# Patient Record
Sex: Male | Born: 1950 | Race: White | Hispanic: No | Marital: Married | Smoking: Former smoker
Health system: Southern US, Community
[De-identification: ages and names within clinical notes are randomized; demographics above are authoritative.]

## PROBLEM LIST (undated history)

## (undated) ENCOUNTER — Emergency Department (HOSPITAL_COMMUNITY): Admission: EM | Discharge status: Absent without leave | Payer: 59

## (undated) ENCOUNTER — Emergency Department (HOSPITAL_COMMUNITY): Admission: EM | Payer: 59

## (undated) DIAGNOSIS — Z931 Gastrostomy status: Secondary | ICD-10-CM

## (undated) DIAGNOSIS — I5022 Chronic systolic (congestive) heart failure: Secondary | ICD-10-CM

## (undated) DIAGNOSIS — R6521 Severe sepsis with septic shock: Secondary | ICD-10-CM

## (undated) DIAGNOSIS — Z9911 Dependence on respirator [ventilator] status: Secondary | ICD-10-CM

## (undated) DIAGNOSIS — Z930 Tracheostomy status: Secondary | ICD-10-CM

## (undated) DIAGNOSIS — M4800 Spinal stenosis, site unspecified: Secondary | ICD-10-CM

## (undated) DIAGNOSIS — M419 Scoliosis, unspecified: Secondary | ICD-10-CM

## (undated) DIAGNOSIS — U071 COVID-19: Secondary | ICD-10-CM

## (undated) DIAGNOSIS — M5136 Other intervertebral disc degeneration, lumbar region: Secondary | ICD-10-CM

## (undated) DIAGNOSIS — M810 Age-related osteoporosis without current pathological fracture: Secondary | ICD-10-CM

## (undated) DIAGNOSIS — M797 Fibromyalgia: Secondary | ICD-10-CM

## (undated) DIAGNOSIS — I482 Chronic atrial fibrillation, unspecified: Secondary | ICD-10-CM

## (undated) DIAGNOSIS — M51369 Other intervertebral disc degeneration, lumbar region without mention of lumbar back pain or lower extremity pain: Secondary | ICD-10-CM

## (undated) DIAGNOSIS — J9621 Acute and chronic respiratory failure with hypoxia: Secondary | ICD-10-CM

## (undated) DIAGNOSIS — J939 Pneumothorax, unspecified: Secondary | ICD-10-CM

## (undated) DIAGNOSIS — A413 Sepsis due to Hemophilus influenzae: Secondary | ICD-10-CM

## (undated) DIAGNOSIS — J1282 Pneumonia due to coronavirus disease 2019: Secondary | ICD-10-CM

## (undated) DIAGNOSIS — J9601 Acute respiratory failure with hypoxia: Secondary | ICD-10-CM

## (undated) HISTORY — DX: Scoliosis, unspecified: M41.9

## (undated) HISTORY — DX: Fibromyalgia: M79.7

## (undated) HISTORY — PX: TONSILECTOMY, ADENOIDECTOMY, BILATERAL MYRINGOTOMY AND TUBES: SHX2538

## (undated) HISTORY — PX: JOINT REPLACEMENT: SHX530

## (undated) HISTORY — PX: CHOLECYSTECTOMY: SHX55

## (undated) HISTORY — PX: REPLACEMENT TOTAL KNEE BILATERAL: SUR1225

## (undated) HISTORY — DX: Age-related osteoporosis without current pathological fracture: M81.0

## (undated) HISTORY — DX: Other intervertebral disc degeneration, lumbar region: M51.36

## (undated) HISTORY — DX: Other intervertebral disc degeneration, lumbar region without mention of lumbar back pain or lower extremity pain: M51.369

## (undated) HISTORY — DX: Spinal stenosis, site unspecified: M48.00

---
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
## 2008-10-11 ENCOUNTER — Encounter: Admission: RE | Admit: 2008-10-11 | Discharge: 2008-10-11 | Payer: Self-pay | Admitting: Rheumatology

## 2009-02-11 ENCOUNTER — Ambulatory Visit (HOSPITAL_COMMUNITY): Admission: RE | Admit: 2009-02-11 | Discharge: 2009-02-11 | Payer: Self-pay | Admitting: Orthopedic Surgery

## 2009-02-19 ENCOUNTER — Encounter: Admission: RE | Admit: 2009-02-19 | Discharge: 2009-02-19 | Payer: Self-pay | Admitting: Orthopedic Surgery

## 2009-03-05 ENCOUNTER — Inpatient Hospital Stay (HOSPITAL_COMMUNITY): Admission: RE | Admit: 2009-03-05 | Discharge: 2009-03-08 | Payer: Self-pay | Admitting: Gastroenterology

## 2009-03-07 ENCOUNTER — Encounter (INDEPENDENT_AMBULATORY_CARE_PROVIDER_SITE_OTHER): Payer: Self-pay | Admitting: Surgery

## 2009-04-26 ENCOUNTER — Inpatient Hospital Stay (HOSPITAL_COMMUNITY): Admission: RE | Admit: 2009-04-26 | Discharge: 2009-04-28 | Payer: Self-pay | Admitting: Orthopedic Surgery

## 2009-10-29 ENCOUNTER — Encounter: Admission: RE | Admit: 2009-10-29 | Discharge: 2009-10-29 | Payer: Self-pay | Admitting: Neurosurgery

## 2010-07-04 ENCOUNTER — Encounter: Admission: RE | Admit: 2010-07-04 | Discharge: 2010-07-04 | Payer: Self-pay | Admitting: Neurosurgery

## 2010-08-15 ENCOUNTER — Inpatient Hospital Stay (HOSPITAL_COMMUNITY): Admission: RE | Admit: 2010-08-15 | Discharge: 2010-08-17 | Payer: Self-pay | Admitting: Orthopedic Surgery

## 2010-08-19 ENCOUNTER — Ambulatory Visit (HOSPITAL_COMMUNITY): Admission: RE | Admit: 2010-08-19 | Discharge: 2010-08-19 | Payer: Self-pay | Admitting: Orthopedic Surgery

## 2010-09-22 ENCOUNTER — Encounter: Payer: Self-pay | Admitting: Orthopedic Surgery

## 2010-09-23 ENCOUNTER — Encounter: Payer: Self-pay | Admitting: Orthopedic Surgery

## 2010-10-23 ENCOUNTER — Encounter: Payer: Self-pay | Admitting: Orthopedic Surgery

## 2010-11-23 ENCOUNTER — Encounter: Payer: Self-pay | Admitting: Orthopedic Surgery

## 2010-12-08 ENCOUNTER — Encounter
Admission: RE | Admit: 2010-12-08 | Discharge: 2010-12-08 | Payer: Self-pay | Source: Home / Self Care | Attending: Neurosurgery | Admitting: Neurosurgery

## 2011-01-01 ENCOUNTER — Other Ambulatory Visit (HOSPITAL_COMMUNITY): Payer: Self-pay | Admitting: Orthopedic Surgery

## 2011-01-01 DIAGNOSIS — T84038A Mechanical loosening of other internal prosthetic joint, initial encounter: Secondary | ICD-10-CM

## 2011-01-12 ENCOUNTER — Ambulatory Visit (HOSPITAL_COMMUNITY)
Admission: RE | Admit: 2011-01-12 | Discharge: 2011-01-12 | Disposition: A | Discharge status: Home / Self Care | Payer: PRIVATE HEALTH INSURANCE | Source: Ambulatory Visit | Attending: Orthopedic Surgery | Admitting: Orthopedic Surgery

## 2011-01-12 ENCOUNTER — Encounter (HOSPITAL_COMMUNITY)
Admission: RE | Admit: 2011-01-12 | Discharge: 2011-01-12 | Disposition: A | Discharge status: Home / Self Care | Payer: PRIVATE HEALTH INSURANCE | Source: Ambulatory Visit | Attending: Orthopedic Surgery | Admitting: Orthopedic Surgery

## 2011-01-12 DIAGNOSIS — M25569 Pain in unspecified knee: Secondary | ICD-10-CM | POA: Insufficient documentation

## 2011-01-12 DIAGNOSIS — Z96659 Presence of unspecified artificial knee joint: Secondary | ICD-10-CM | POA: Insufficient documentation

## 2011-01-12 DIAGNOSIS — T84038A Mechanical loosening of other internal prosthetic joint, initial encounter: Secondary | ICD-10-CM

## 2011-01-12 MED ORDER — TECHNETIUM TC 99M MEDRONATE IV KIT
23.5000 | PACK | Freq: Once | INTRAVENOUS | Status: AC | PRN
  Administered 2011-01-12: 24 via INTRAVENOUS

## 2011-02-05 LAB — BASIC METABOLIC PANEL
BUN: 11 mg/dL (ref 6–23)
BUN: 12 mg/dL (ref 6–23)
CO2: 33 mEq/L — ABNORMAL HIGH (ref 19–32)
Chloride: 103 mEq/L (ref 96–112)
GFR calc non Af Amer: >60 mL/min (ref >60)
Glucose, Bld: 129 mg/dL — ABNORMAL HIGH (ref 70–99)
Glucose, Bld: 131 mg/dL — ABNORMAL HIGH (ref 70–99)
Potassium: 4.1 mEq/L (ref 3.5–5.1)
Potassium: 4.3 mEq/L (ref 3.5–5.1)

## 2011-02-05 LAB — URINALYSIS, ROUTINE W REFLEX MICROSCOPIC
Nitrite: NEGATIVE
Protein, ur: NEGATIVE mg/dL
Specific Gravity, Urine: 1.01 (ref 1.005–1.030)

## 2011-02-05 LAB — COMPREHENSIVE METABOLIC PANEL
AST: 21 U/L (ref 0–37)
Alkaline Phosphatase: 51 U/L (ref 39–117)
BUN: 12 mg/dL (ref 6–23)
CO2: 32 mEq/L (ref 19–32)
Calcium: 9.7 mg/dL (ref 8.4–10.5)
Creatinine, Ser: 0.7 mg/dL (ref 0.4–1.5)
Glucose, Bld: 103 mg/dL — ABNORMAL HIGH (ref 70–99)
Potassium: 4.6 mEq/L (ref 3.5–5.1)
Sodium: 140 mEq/L (ref 135–145)
Total Bilirubin: 0.2 mg/dL — ABNORMAL LOW (ref 0.3–1.2)

## 2011-02-05 LAB — CBC
HCT: 35.5 % — ABNORMAL LOW (ref 39.0–52.0)
HCT: 35.5 % — ABNORMAL LOW (ref 39.0–52.0)
Hemoglobin: 16.6 g/dL (ref 13.0–17.0)
MCH: 32 pg (ref 26.0–34.0)
MCH: 32.8 pg (ref 26.0–34.0)
MCHC: 33.2 g/dL (ref 30.0–36.0)
MCHC: 34.9 g/dL (ref 30.0–36.0)
MCV: 93.9 fL (ref 78.0–100.0)
MCV: 94.7 fL (ref 78.0–100.0)
MCV: 94.9 fL (ref 78.0–100.0)
Platelets: 315 10*3/uL (ref 150–400)
RBC: 5.06 MIL/uL (ref 4.22–5.81)
RDW: 13.3 % (ref 11.5–15.5)
RDW: 13.5 % (ref 11.5–15.5)
RDW: 13.5 % (ref 11.5–15.5)
WBC: 10 10*3/uL (ref 4.0–10.5)
WBC: 12.5 10*3/uL — ABNORMAL HIGH (ref 4.0–10.5)

## 2011-02-05 LAB — TYPE AND SCREEN: ABO/RH(D): A POS

## 2011-02-05 LAB — URINE CULTURE
Colony Count: NO GROWTH
Culture  Setup Time: 201109191137
Culture: NO GROWTH

## 2011-02-05 LAB — DIFFERENTIAL
Eosinophils Absolute: 0.2 10*3/uL (ref 0.0–0.7)
Eosinophils Relative: 2 % (ref 0–5)
Monocytes Relative: 10 % (ref 3–12)
Neutro Abs: 5.8 10*3/uL (ref 1.7–7.7)
Neutrophils Relative %: 57 % (ref 43–77)

## 2011-03-02 LAB — CBC
HCT: 35 % — ABNORMAL LOW (ref 39.0–52.0)
Hemoglobin: 12.1 g/dL — ABNORMAL LOW (ref 13.0–17.0)
Hemoglobin: 13.7 g/dL (ref 13.0–17.0)
Hemoglobin: 17.1 g/dL — ABNORMAL HIGH (ref 13.0–17.0)
MCHC: 34.7 g/dL (ref 30.0–36.0)
MCV: 93.7 fL (ref 78.0–100.0)
RBC: 3.73 MIL/uL — ABNORMAL LOW (ref 4.22–5.81)
RBC: 4.21 MIL/uL — ABNORMAL LOW (ref 4.22–5.81)
RBC: 5.3 MIL/uL (ref 4.22–5.81)
WBC: 11.4 10*3/uL — ABNORMAL HIGH (ref 4.0–10.5)
WBC: 13.1 10*3/uL — ABNORMAL HIGH (ref 4.0–10.5)

## 2011-03-02 LAB — URINALYSIS, ROUTINE W REFLEX MICROSCOPIC
Bilirubin Urine: NEGATIVE
Nitrite: NEGATIVE
Specific Gravity, Urine: 1.012 (ref 1.005–1.030)
Urobilinogen, UA: 1 mg/dL (ref 0.0–1.0)

## 2011-03-02 LAB — URINE CULTURE

## 2011-03-02 LAB — BASIC METABOLIC PANEL
BUN: 6 mg/dL (ref 6–23)
CO2: 30 mEq/L (ref 19–32)
Calcium: 8.1 mg/dL — ABNORMAL LOW (ref 8.4–10.5)
Calcium: 9.3 mg/dL (ref 8.4–10.5)
Chloride: 108 mEq/L (ref 96–112)
GFR calc Af Amer: >60 mL/min (ref >60)
GFR calc Af Amer: >60 mL/min (ref >60)
GFR calc Af Amer: >60 mL/min (ref >60)
GFR calc non Af Amer: >60 mL/min (ref >60)
Potassium: 3.6 mEq/L (ref 3.5–5.1)
Sodium: 138 mEq/L (ref 135–145)
Sodium: 140 mEq/L (ref 135–145)
Sodium: 142 mEq/L (ref 135–145)

## 2011-03-02 LAB — HEPATIC FUNCTION PANEL
Albumin: 3.5 g/dL (ref 3.5–5.2)
Bilirubin, Direct: 0.1 mg/dL (ref 0.0–0.3)
Total Bilirubin: 0.7 mg/dL (ref 0.3–1.2)

## 2011-03-02 LAB — APTT: aPTT: 27 seconds (ref 24–37)

## 2011-03-02 LAB — PROTIME-INR
INR: 1 (ref 0.00–1.49)
INR: 1 (ref 0.00–1.49)

## 2011-03-02 LAB — TYPE AND SCREEN: ABO/RH(D): A POS

## 2011-03-04 LAB — COMPREHENSIVE METABOLIC PANEL
ALT: 48 U/L (ref 0–53)
ALT: 43 U/L (ref 0–53)
AST: 28 U/L (ref 0–37)
Albumin: 3 g/dL — ABNORMAL LOW (ref 3.5–5.2)
Albumin: 3.3 g/dL — ABNORMAL LOW (ref 3.5–5.2)
Albumin: 3.6 g/dL (ref 3.5–5.2)
Alkaline Phosphatase: 59 U/L (ref 39–117)
BUN: 12 mg/dL (ref 6–23)
BUN: 15 mg/dL (ref 6–23)
CO2: 30 mEq/L (ref 19–32)
CO2: 28 mEq/L (ref 19–32)
Calcium: 8.2 mg/dL — ABNORMAL LOW (ref 8.4–10.5)
Calcium: 8.6 mg/dL (ref 8.4–10.5)
Calcium: 8.8 mg/dL (ref 8.4–10.5)
Chloride: 100 mEq/L (ref 96–112)
Creatinine, Ser: 0.68 mg/dL (ref 0.4–1.5)
Creatinine, Ser: 0.64 mg/dL (ref 0.4–1.5)
GFR calc Af Amer: >60 mL/min (ref >60)
GFR calc non Af Amer: >60 mL/min (ref >60)
GFR calc non Af Amer: >60 mL/min (ref >60)
Glucose, Bld: 89 mg/dL (ref 70–99)
Glucose, Bld: 115 mg/dL — ABNORMAL HIGH (ref 70–99)
Glucose, Bld: 159 mg/dL — ABNORMAL HIGH (ref 70–99)
Potassium: 3.9 mEq/L (ref 3.5–5.1)
Sodium: 142 mEq/L (ref 135–145)
Total Bilirubin: 1.1 mg/dL (ref 0.3–1.2)
Total Protein: 5.9 g/dL — ABNORMAL LOW (ref 6.0–8.3)
Total Protein: 6.3 g/dL (ref 6.0–8.3)

## 2011-03-04 LAB — CBC
HCT: 44.7 % (ref 39.0–52.0)
HCT: 46.7 % (ref 39.0–52.0)
HCT: 47.4 % (ref 39.0–52.0)
Hemoglobin: 15.3 g/dL (ref 13.0–17.0)
Hemoglobin: 16.3 g/dL (ref 13.0–17.0)
Hemoglobin: 16.3 g/dL (ref 13.0–17.0)
MCHC: 34.2 g/dL (ref 30.0–36.0)
MCHC: 34.4 g/dL (ref 30.0–36.0)
MCHC: 34.9 g/dL (ref 30.0–36.0)
MCV: 96.2 fL (ref 78.0–100.0)
MCV: 94.6 fL (ref 78.0–100.0)
Platelets: 215 10*3/uL (ref 150–400)
Platelets: 255 10*3/uL (ref 150–400)
RBC: 4.65 MIL/uL (ref 4.22–5.81)
RBC: 4.6 MIL/uL (ref 4.22–5.81)
RBC: 4.93 MIL/uL (ref 4.22–5.81)
RDW: 13.5 % (ref 11.5–15.5)
WBC: 17.7 10*3/uL — ABNORMAL HIGH (ref 4.0–10.5)
WBC: 16.2 10*3/uL — ABNORMAL HIGH (ref 4.0–10.5)
WBC: 17 10*3/uL — ABNORMAL HIGH (ref 4.0–10.5)

## 2011-03-04 LAB — PROTIME-INR
INR: 1.1 (ref 0.00–1.49)
Prothrombin Time: 14.4 seconds (ref 11.6–15.2)

## 2011-03-04 LAB — TYPE AND SCREEN: ABO/RH(D): A POS

## 2011-03-04 LAB — LIPASE, BLOOD
Lipase: 1124 U/L — ABNORMAL HIGH (ref 11–59)
Lipase: 916 U/L — ABNORMAL HIGH (ref 11–59)

## 2011-03-04 LAB — ABO/RH: ABO/RH(D): A POS

## 2011-03-05 LAB — PROTIME-INR
INR: 0.9 (ref 0.00–1.49)
Prothrombin Time: 12.3 seconds (ref 11.6–15.2)

## 2011-03-05 LAB — URINALYSIS, ROUTINE W REFLEX MICROSCOPIC
Glucose, UA: NEGATIVE mg/dL
Hgb urine dipstick: NEGATIVE
Ketones, ur: NEGATIVE mg/dL
Protein, ur: NEGATIVE mg/dL
pH: 6.5 (ref 5.0–8.0)

## 2011-03-05 LAB — URINE CULTURE: Colony Count: 9000

## 2011-03-05 LAB — COMPREHENSIVE METABOLIC PANEL
AST: 67 U/L — ABNORMAL HIGH (ref 0–37)
BUN: 13 mg/dL (ref 6–23)
CO2: 27 mEq/L (ref 19–32)
Calcium: 9 mg/dL (ref 8.4–10.5)
Chloride: 103 mEq/L (ref 96–112)
Creatinine, Ser: 0.71 mg/dL (ref 0.4–1.5)
GFR calc non Af Amer: >60 mL/min (ref >60)
Glucose, Bld: 97 mg/dL (ref 70–99)
Total Bilirubin: 2.8 mg/dL — ABNORMAL HIGH (ref 0.3–1.2)

## 2011-03-05 LAB — RENAL FUNCTION PANEL
BUN: 13 mg/dL (ref 6–23)
CO2: 27 mEq/L (ref 19–32)
Chloride: 104 mEq/L (ref 96–112)
Glucose, Bld: 94 mg/dL (ref 70–99)
Phosphorus: 4.1 mg/dL (ref 2.3–4.6)
Potassium: 3.7 mEq/L (ref 3.5–5.1)
Sodium: 138 mEq/L (ref 135–145)

## 2011-03-05 LAB — CBC
HCT: 43.8 % (ref 39.0–52.0)
Hemoglobin: 15.1 g/dL (ref 13.0–17.0)
MCHC: 34.5 g/dL (ref 30.0–36.0)
MCV: 95.8 fL (ref 78.0–100.0)
RBC: 4.57 MIL/uL (ref 4.22–5.81)
WBC: 10.4 10*3/uL (ref 4.0–10.5)

## 2011-03-05 LAB — CYCLIC CITRUL PEPTIDE ANTIBODY, IGG: Cyclic Citrullin Peptide Ab: 0.6 U/mL (ref <7)

## 2011-04-07 NOTE — Op Note (Signed)
NAME:  Sergio Mitchell, Sergio Mitchell NO.:  [id number]   MEDICAL RECORD NO.:  [id number]          PATIENT TYPE:  OIB   LOCATION:  5121                         FACILITY:  [facility]   PHYSICIAN:  Velora Heckler, MD      DATE OF BIRTH:  May 13, 1951   DATE OF PROCEDURE:  03/07/2009  DATE OF DISCHARGE:                               OPERATIVE REPORT   PREOPERATIVE DIAGNOSES:  Chronic cholecystitis, cholelithiasis, and  history of choledocholithiasis.   POSTOPERATIVE DIAGNOSES:  Chronic cholecystitis, cholelithiasis, and  history of choledocholithiasis.   PROCEDURE:  Laparoscopic cholecystectomy with intraoperative  cholangiography.   SURGEON:  Velora Heckler, MD, FACS   ASSISTANT:  Kelle Darting. Rennis Harding, NP   ANESTHESIA:  General.   ESTIMATED BLOOD LOSS:  Minimal.   PREPARATION:  ChloraPrep.   COMPLICATIONS:  None.   INDICATIONS:  The patient is a 60 year old white male, admitted on the  Gastroenterology Service with cholelithiasis, common bile duct stones,  and abnormal liver function test.  The patient was taken to the  endoscopy suite on March 05, 2009, where he underwent ERCP with  sphincterotomy and stone extraction.  Postoperative course was  complicated by mild post ERCP pancreatitis.  This improved significantly  over 48 hours, and the patient has now prepared and brought to the  operating room for cholecystectomy.   BODY OF REPORT:  Procedure was done in OR #70 at the [facility].  The patient was brought to the operating room and  placed in supine position on the operating room table.  Following  administration of general anesthesia, the patient was positioned and  then prepped and draped in the usual strict aseptic fashion.  After  ascertaining that an adequate level of anesthesia had been achieved, an  infraumbilical incision was made a midline with a #15 blade.  Dissection  was carried down to the fascia.  Fascia was incised in the midline and  the peritoneal cavity was entered cautiously.  A 0 Vicryl purse-string  suture was placed in the fascia.  A Hasson cannula was introduced under  direct vision into the peritoneal cavity and secured with a purse-string  suture.  Abdomen was insufflated with carbon dioxide.  Laparoscope was  introduced and the abdomen explored.  There was an inflammatory process  in the right upper quadrant with adhesions.  Omentum obscures the  gallbladder.  Operative ports were placed along the right costal margin  in the midline, midclavicular line, and anterior axillary line.  The  liver edge was grasped and elevated.  Adhesions were gently taken down  with blunt dissection and hemostasis obtained with electrocautery.  Gallbladder was identified.  It was small and contracted.  It was  grasped and retracted cephalad and laterally.  With some difficulty,  adhesions are mobilized off the surface of the gallbladder and the neck  of the gallbladder was identified.  Cystic duct was dissected out.  Cystic artery was identified, doubly clipped, and divided.  Cystic duct  was encircled.  A clip was placed at the neck of the gallbladder.  Cystic  duct was incised.  Dark bile emanates from the cystic duct.  A  Cook cholangiography catheter was inserted in a stab wound in the right  upper quadrant.  It was placed into the cystic duct and secured with 2  Ligaclips.  Using C-arm fluoroscopy, real time cholangiography was  performed.  There was rapid filling of a mildly dilated common bile  duct.  There was reflux of contrast into the right and left hepatic  ductal systems.  There was free flow of contrast distally into the  duodenum without filling defect or obstruction.  Clips were withdrawn.  Cook catheter was removed from the peritoneal cavity.  Due to the  thickness of the cystic duct, an Endoloop was used for closure of the  cystic duct in order to assure complete closure.  Additionally, 2 clips  were placed on  the cystic duct.  Cystic duct was divided.  Gallbladder  was excised from the gallbladder bed.  Posterior branch of the cystic  artery was divided between Ligaclips with the electrocautery.  Hook  electrocautery was used to resect the remaining gallbladder from the  gallbladder bed where it was relatively intrahepatic.  Gallbladder was  completely excised and placed into an EndoCatch bag.  It was removed  through the umbilical port without difficulty.  A 0 Vicryl purse-string  suture was tied securely at the umbilical wound.  Right upper quadrant  was irrigated with warm saline.  Good hemostasis was noted.  Fluid was  evacuated.  Ports were removed under direct vision and pneumoperitoneum  was released.  Good hemostasis was noted at all port sites.  Port sites  were anesthetized with local anesthetic.  Skin incisions were closed  with interrupted 4-0 Vicryl subcuticular sutures.  Wounds were washed  and dried, and benzoin and Steri-Strips were applied.  Sterile dressings  were applied.  The patient was awakened from anesthesia and brought to  the recovery room in stable condition.  The patient tolerated the  procedure well.      Velora Heckler, MD  Electronically Signed     TMG/MEDQ  D:  03/07/2009  T:  03/08/2009  Job:  [id number]   cc:   Danise Edge, M.D.  Dyke Brackett, M.D.

## 2011-04-07 NOTE — H&P (Signed)
NAME:  Sergio Mitchell, Sergio Mitchell NO.:  [id number]   MEDICAL RECORD NO.:  [id number]          PATIENT TYPE:  OIB   LOCATION:  5121                         FACILITY:  [facility]   PHYSICIAN:  Petra Kuba, M.D.    DATE OF BIRTH:  [birth year]   DATE OF ADMISSION:  03/05/2009  DATE OF DISCHARGE:                              HISTORY & PHYSICAL   HISTORY:  The patient is admitted after ERCP sphincterotomy and stone  extraction for observation and then hopefully if no delayed  complications, we will have lap chole tomorrow.  The patient has had  some belly pain, nausea, and vomiting, presented for his knee surgery  and although his GI symptoms had improved, his liver tests were up.  Ultrasound showed gallstones and CBD stones and he was referred for  cholecystectomy and ERCP prior to his knee surgery.  For the last week,  he has not had any GI problems except for reflux.   PAST MEDICAL HISTORY:  Pertinent for significant knee problems as well  as reflux and history of ulcers and some sinus issues as well as a  history of eventual hernia and tonsillectomy.   FAMILY HISTORY:  Pertinent for his sister with gallbladder problems, but  no other GI issues.   SOCIAL HISTORY:  Does smoke, but does not drink.   ALLERGIES:  NEOSPORIN and PENICILLIN.   REVIEW OF SYSTEMS:  Negative except above.   CURRENT MEDICINES AT HOME:  Toradol, Lexapro, Neurontin, Claritin,  Lortab, and omeprazole.   PHYSICAL EXAMINATION:  VITAL SIGNS:  Stable, afebrile.  GENERAL:  No acute distress.  HEENT:  Sclerae nonicteric.  NECK:  Supple without adenopathy.  LUNGS:  Clear.  HEART:  Regular rate and rhythm.  ABDOMEN:  Soft and nontender.   LABORATORY DATA:  Initially pertinent for elevated liver tests, but then  they resolved in Dr. Henriette Combs office.  Ultrasound did show CBD stone as  well as gallstones.  An ERCP did confirm the CBD stones, status post  sphincterotomy and removal.   PLAN:  We will observe  for delayed complications.  If not, lap chole  with probable intraoperative cholangiogram tomorrow.  I have discussed  the above with the Surgical Team and then when okay from a postop  standpoint, can return to Dr. Madelon Lips for his knee surgery.           ______________________________  Petra Kuba, M.D.     MEM/MEDQ  D:  03/05/2009  T:  03/06/2009  Job:  [id number]   cc:   Danise Edge, M.D.  Velora Heckler, MD  Dyke Brackett, M.D.

## 2011-04-07 NOTE — Op Note (Signed)
NAME:  Sergio Mitchell, Sergio Mitchell NO.:  [id number]   MEDICAL RECORD NO.:  [id number]          PATIENT TYPE:  OIB   LOCATION:  5121                         FACILITY:  [facility]   PHYSICIAN:  Petra Kuba, M.D.    DATE OF BIRTH:  [birth year]   DATE OF PROCEDURE:  03/05/2009  DATE OF DISCHARGE:                               OPERATIVE REPORT   PROCEDURES:  Endoscopic retrograde cholangiopancreatography,  sphincterotomy, stone extraction.   INDICATION:  The patient with CBD stone on ultrasound and elevated liver  test, which resolved when his symptoms resolved.  Consent was signed  after risks, benefits, methods, and options thoroughly discussed by both  Dr. Laural Benes in the office last week and me prior to sedation with both  he and his wife.   MEDICINES USED:  Fentanyl 125 mcg, Versed 9 mg, Benadryl 25 mg, glucagon  2 mg throughout the procedure.   PROCEDURE:  The side-viewing therapeutic video duodenoscope was inserted  by indirect vision into the stomach and advanced into the duodenum to a  widely patent pylorus.  The periampullary diverticula and normal-  appearing ampulla was seen.  On the first attempt at cannulation, the  wire seemed to go towards the pancreas.  This did occur 2 or 3 times.  We then went ahead and turned him on his side to get a better angle on  the ampulla and seemed to have deep selective cannulation of the CBD.  Unfortunately with him being on his side and his arm being in the way,  we could not get great pictures of his CBD.  Dr. Abelino Derrick of  Radiology was consulted and felt we were probably in the CBD as well.  We did go ahead and aspirate some bile through the catheter, which  helped confirm we were in the right position, although the pictures were  not great.  We did go ahead and make a very small sphincterotomy in this  position with our difficulty with fluoro.  We elected to roll him back  on his belly.  In doing so, we lost our position and on  repeat  cannulation, the wire again went towards the pancreas.  We at this  junction tried to put a PD stent in, 5-cm 5-French unfortunately once it  was placed properly while we were withdrawing the wire and we had pushed  the stent with the sphincterotome.  The stent got hung up within the  sphincterotome and could not be released, so the wire in the stent and  sphincterotome were removed.  We then tried the smaller sphincterotome  loaded with smaller wire and we were able to get deep selective  cannulation into the CBD with much better filling and much better  pictures and an obvious CBD stone was seen.  We went ahead and increased  the sphincterotomy site moderately till we had excellent biliary  drainage since the stone seemed to be moderate size.  We could get the  fully bowed sphincterotome in and out of the duct.  We then went ahead  and exchanged for the balloon and  on the first balloon pull-through, the  stone was delivered.  We then went ahead and proceeded with a few more  balloon pull-throughs and then an occlusion cholangiogram, not mentioned  above the cystic duct was patent and the gallbladder had some contrast  in it and negative occlusion cholangiogram was seen and there was  adequate biliary drainage after we pulled the balloon through.  We were  able to use the 12-15 mm adjustable balloon and pulled the 15-mm balloon  through.  We elected to stop the procedure at this juncture.  We did not  try to recannulate the pancreas and place a further PD stent.  The scope  was removed.  Not mentioned above, the patient did have some respiratory  issues in the middle of the procedure, required rolling him on his left  side to complete the procedure, which is how we did the occlusion  cholangiogram and pull the balloon through at the end.   ENDOSCOPIC DIAGNOSES:  1. Periampullary diverticula.  2. Few wire advancements and minimal dye into the pancreas.  3. Change to his left  side secondary to breathing x2.  4. Questionable PD wire left in place, but failed placing a 5-cm 5-      French stent into the pancreas due to getting hung up in the      sphincterotome.  5. CBD dilated patent cystic duct moderate-sized stone, status post      removal after sphincterotomy x2 and balloon pull-through with the      adjustable balloon 12-15 mm.  6. Negative balloon pull-throughs and negative occlusion cholangiogram      with good drainage at the end of the procedure.   PLAN:  We will observe overnight for observation.  Make sure no delayed  complications and if none, probably LAP CHOLE tomorrow.  I have  discussed the case with the surgeons.           ______________________________  Petra Kuba, M.D.     MEM/MEDQ  D:  03/05/2009  T:  03/06/2009  Job:  [id number]   cc:   Velora Heckler, MD  Dyke Brackett, M.D.  Danise Edge, M.D.

## 2011-04-07 NOTE — Consult Note (Signed)
NAME:  ARRICK, DUTTON NO.:  [id number]   MEDICAL RECORD NO.:  [id number]          PATIENT TYPE:  OIB   LOCATION:  5121                         FACILITY:  [facility]   PHYSICIAN:  Velora Heckler, MD      DATE OF BIRTH:  1951-02-14   DATE OF CONSULTATION:  03/05/2009  DATE OF DISCHARGE:                                 CONSULTATION   REQUESTING PHYSICIAN:  Petra Kuba, MD   CONSULTING SURGEON:  Velora Heckler, MD   REASON FOR CONSULTATION:  Choledocholithiasis, needs laparoscopic  cholecystectomy.   HISTORY OF PRESENT ILLNESS:  Mr. Melendrez is a 60 year old white male with  the history of peptic ulcer disease, BPH, and osteoarthritis, who was  going in for a knee arthroscopy on February 19, 2009, by Dr. Madelon Lips.  He  did have preop labs done, which revealed elevated LFTs with a total  bilirubin of 2.8 and an alkaline phosphatase of 142.  At this time, the  ultrasound was obtained, which showed a dilated common bile duct to 14  mm with an 11-mm common bile duct stone.  At that time, the patient was  sent to [facility] where an outpatient ERCP was set up for  today.  The patient did have an ERCP today and the common bile duct  stone was removed.  At this time, we were asked to see the patient for  possible laparoscopic cholecystectomy tomorrow.  Currently, of note upon  arrival to the room, the patient is currently complaining of severe  diffuse abdominal pain and cramping along with nausea.  He is hunched  over on the toilet in almost a fetal position.   REVIEW OF SYSTEMS:  Please see HPI.  The patient does complain of  several episodes within the past month or so of nausea and some emesis  that would later pass.  Currently, he is complaining of some shortness  of breath, but no chest pain.  He admits to diffuse abdominal pain  currently.  He does admit to BPH with difficulty urinating.  Otherwise,  all other systems are currently negative.   PAST MEDICAL  HISTORY:  1. Peptic ulcer disease.  2. Osteoarthritis.  3. BPH.  4. Anxiety.   PAST SURGICAL HISTORY:  1. Tonsillectomy.  2. Hernia repair.   SOCIAL HISTORY:  The patient is married.  He has 3 children, who are all  grown.  He is a Curator.  He smokes approximately 7-8 cigars a day.  He  quit smoking cigarettes several years back.  He does not drink any  alcohol.   ALLERGIES:  1. NEOSPORIN.  2. PENICILLIN, which causes diarrhea.   MEDICATIONS:  Currently, the patient and his wife were in the room, but  he did not bring the list of his medications in.  Therefore, I do not  know of any medications that he takes at home.   PHYSICAL EXAMINATION:  GENERAL:  This is a 60 year old white male who is  currently in acute distress secondary to abdominal pain.  Otherwise, he  is well developed and well nourished.  VITAL SIGNS:  Temperature is 97.5, pulse 44, respirations 16, and blood  pressure 148/66.  HEENT:  Head is normocephalic and atraumatic.  Sclerae noninjected.  Pupils were equal, round, and reactive to light.  Ears and nose without  any obvious masses or lesions.  No rhinorrhea.  Mouth is pink and moist.  Throat shows no exudate.  NECK:  Supple.  Trachea is midline.  No thyromegaly.  HEART:  Currently, regular rhythm.  However, he is somewhat bradycardic  at 44 beats per minute.  Otherwise, I did not notice any murmurs,  gallops, or rubs.  Carotid, radial, and pedal pulses 2+ bilaterally.  LUNGS:  Clear to auscultation bilaterally with no wheezes, rhonchi, or  rales noted.  Respiratory effort is nonlabored.  ABDOMEN:  Soft, however, moderately distended.  He does not have any  active bowel sounds and is exquisitely tender in all 4 quadrants. It  appears that the patient is developing some peritoneal signs.  However,  he has just received some medicine and so I will come back and reexamine  him within the next 30 minutes or so.  MUSCULOSKELETAL:  All 4 extremities are  symmetrical with no cyanosis,  clubbing, or edema  NEUROLOGIC:  Cranial nerves II through XII appeared to be grossly  intact.  PSYCH:  The patient is alert and oriented with an appropriate affect.   LABORATORY DATA AND DIAGNOSTICS:  Ultrasound done several weeks ago  shows no cholecystitis, but cholelithiasis and choledocholithiasis and  an 11-mm common bile duct stone and a 14-mm dilated common bile duct.  At that time, I believe on February 11, 2009, labs were obtained, which  showed an AST of 67, ALT of 204, alkaline phosphatase of 142, and total  bilirubin of 2.8.  Currently, his CBC as well as his CMET are pending.  A stat two-view abdominal x-ray is also pending.   IMPRESSION:  1. Choledocholithiasis.  2. Cholelithiasis.  3. Status post endoscopic retrograde cholangiopancreatography.  4. Severe abdominal pain, status post endoscopic retrograde      cholangiopancreatography.   PLAN:  At this time, due to the patient's severe pain, we will obtain a  stat two-view abdominal x-ray to check for free air.  Otherwise, we will  check a lipase in the morning to look for post ERCP pancreatitis.  He  will be made n.p.o. after midnight and we will write for a permit for a  laparoscopic cholecystectomy to be done in the morning.  Otherwise, we  will order Cipro on-call to the OR prophylactically.  In the meantime,  if for some reason, his two-view abdominal x-rays come back showing  anything different such as free air, obviously these plans will be  altered.      Letha Cape, PA      Velora Heckler, MD  Electronically Signed    KEO/MEDQ  D:  03/05/2009  T:  03/06/2009  Job:  [id number]

## 2011-04-07 NOTE — Op Note (Signed)
NAME:  Sergio Mitchell, Sergio Mitchell NO.:  [id number]   MEDICAL RECORD NO.:  [id number]          PATIENT TYPE:  INP   LOCATION:  5008                         FACILITY:  [facility]   PHYSICIAN:  Dyke Brackett, M.D.    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   POSTOPERATIVE DIAGNOSIS:  Osteoarthritis right knee with varus  deformity.   OPERATION:  Right total knee replacement.  This is LCS cemented standard  plus femur, size 5 tibia, 10-mm bearing, standard patella 3 peg.   SURGEON:  Dyke Brackett, M.D.   ASSISTANTKatrinka Blazing, PA   TOURNIQUET TIME:  1 hour 20 minutes.   DESCRIPTION OF PROCEDURE:  Sterile prep and drape, exsanguination of  legs, placed in 350.  A straight skin incision with medial parapatellar  approach to the knee made with cutting of the tibia initially with a 7-  degree slope with the appropriate valgus, was followed by an anterior-  posterior femoral cut with a 4-degree valgus cut, release of the medial  side of the knee due to severity of the varus deformity was noted to  balance the flexion-extension gap 10 mm.  Chamfer cut was done with keel  holes for the prosthesis.   Attention was next directed at the tibia.  Prior to this though a small  amount of posterior lip was removed from both condyles and the femur.  Tibia was previous being cut and keel hole was cut for a size 5 tibia  followed by appropriate trialing of the tibial and femur and then the  patella was cut leaving 13 mm of native patella for 3 peg patellar  trial.  Trials were all placed and a full extension was obtained, good  stability.  No tendency for a bearing spin-out, equal balancing on both  sides of the knee medial and laterally was noted.   Trials were removed, components were  inserted and final form with the  cement with tibia followed by femur, patella.  Excess cement was allowed  to harden.  The trial bearing had been placed, the head was removed.  Tourniquet was released.  Excess cement prior to this was removed from  the posterior aspect of the knee and also the posterior aspect of the  knee was checked with the trial bearing out.  There was no excessive  bleeding noted.  Final bearing was placed followed by closure.  Hemovac drain was placed  exiting superolaterally.  Closure was packed with interrupted #1  Ethibond, 2-0 Vicryl, and skin clips.  Marcaine with epinephrine  infiltrated in skin, light compressive sterile dressing applied, taken  to recovery room in stable condition.      Dyke Brackett, M.D.  Electronically Signed     WDC/MEDQ  D:  04/26/2009  T:  04/27/2009  Job:  [id number]

## 2011-04-10 NOTE — Discharge Summary (Signed)
NAME:  Sergio Mitchell, Sergio Mitchell NO.:  [id number]   MEDICAL RECORD NO.:  [id number]          PATIENT TYPE:  INP   LOCATION:  5121                         FACILITY:  [facility]   PHYSICIAN:  Petra Kuba, M.D.    DATE OF BIRTH:  [birth year]   DATE OF ADMISSION:  03/05/2009  DATE OF DISCHARGE:  03/08/2009                               DISCHARGE SUMMARY   ADMITTING DIAGNOSIS:  Choledocholithiasis status post endoscopic  retrograde cholangiopancreatography.   DISCHARGE DIAGNOSES:  1. Cholelithiasis with cholecystitis status post laparoscopic      cholecystectomy, also choledocholithiasis status post endoscopic      retrograde cholangiopancreatography.  2. Mild post endoscopic retrograde cholangiopancreatography      pancreatitis.   CONSULTATIONS:  On March 05, 2009, Dr. Darnell Level of the [facility] saw the patient and agreed that in time he was  an appropriate candidate for laparoscopic cholecystectomy.   PROCEDURES:  1. The patient was admitted status post endoscopic retrograde      cholangiopancreatography.  This was done on March 05, 2009, by Dr.      Vida Rigger, who found the following:      a.     Periampullary diverticula.      b.     Moderate-sized stone in the common bile duct status post       removal after sphincterotomy x2.  2. The patient underwent laparoscopic cholecystectomy on March 07, 2009, by Dr. Darnell Level.  This was done without complication.  The      patient had a negative intraoperative cholangiogram.   SIGNIFICANT LABORATORY DATA:  On admission, the patient had a white  count of 16.2.  LFTs were as follows:  Total bilirubin 1.1, alkaline  phosphatase 57, AST 58, ALT 74.  At discharge, the patient's white count  had actually increased and it was 17.7.  BUN 8, creatinine 0.68.  LFTs  had normalized with a total bilirubin of 1.1, alkaline phosphatase 52,  AST 37, ALT 48.  On admission on March 06, 2009, the patient's lipase  was 916.  On March 07, 2009, the patient's lipase was down to 111.   BRIEF HISTORY AND PHYSICAL:  Sergio Mitchell is a pleasant 60 year old  gentleman who presented to Dr. Candise Bowens office planning to have  arthroscopic knee surgery.  He was found to have elevated LFTs and was  sent to see Dr. Danise Edge with [facility] at [place].  Upon seeing  Dr. Laural Benes, the patient reported a history of abdominal pain, nausea,  vomiting around Easter that had resolved, and on February 19, 2009, he had  an abdominal ultrasound that showed multiple small gallstones and an 11-  mm common bile duct stone.  On February 11, 2009, his LFTs were as follows:  Total bilirubin 2.8, alkaline phosphatase 142, AST 67, ALT 204.  Dr.  Laural Benes arranged for the patient to have an ERCP, done by Dr. Ewing Schlein at  [facility].  The plan was to admit the patient after ERCP for  possible laparoscopic cholecystectomy, and as planned  on March 05, 2009,  the patient had an ERCP with successful stone removal.  The patient was  admitted for close observation.  Immediately following his procedure, he  did have moderate amount of pain following the procedure.  For this, he  was given pain medications and antiemetics.  He was seen by Surgery in  consultation, monitored for the next 2 days as his lipase and white  count did go up slightly.  When the patient's labs and pain improved,  Dr. Gerrit Friends took him to surgery and performed a successful laparoscopic  cholecystectomy.  At that time, the patient had an negative  intraoperative cholangiogram.  Following surgery, the patient was doing  very well.  He still had some abdominal pain but felt that it was very  manageable and that he would do better at home than he would in the  hospital.  His white count had gone up to 17.7; however, this was felt  to be inflammatory following his procedures.  He was given Cipro IV  throughout his hospitalization.  On the date of discharge, March 08, 2009,  the patient was eating solid food, able to ambulate well.  He  reported his pain had greatly decreased, and he was ready to go home.  He was discharged to home in good condition with followup arranged to  see Dr. Darnell Level.   DISCHARGE MEDICATIONS:  1. Gabapentin 300 mg 3 times a day.  2. Beta Prostate 2 tablets.  3. Lexapro 10 mg once daily.  4. Zolpidem 10 mg once a day at bedtime.  5. Promethazine 25 mg 4 times daily.  6. The patient was given a prescription for Percocet 5/325 one to two      tablets every 4 hours as needed for pain.  7. Claritin 10 mg daily.  He was instructed not to take his hydrocodone with Tylenol or his  Robaxin while taking Percocet.   The patient was also counseled to stop smoking cigars as this may  contribute to GI illness and specifically pancreatic disease.  He was  advised to avoid alcohol for several months.  He was also advised that  if he developed a fever of over 101 or increased pain to please call  [facility].  He has followup with Dr. Darnell Level at  [facility] in 1-2 weeks.      Stephani Police, PA    ______________________________  Petra Kuba, M.D.    MLY/MEDQ  D:  03/11/2009  T:  03/12/2009  Job:  [id number]   cc:   Velora Heckler, MD  Danise Edge, M.D.  Dyke Brackett, M.D.

## 2016-10-06 DIAGNOSIS — F419 Anxiety disorder, unspecified: Secondary | ICD-10-CM | POA: Insufficient documentation

## 2016-10-06 DIAGNOSIS — I4891 Unspecified atrial fibrillation: Secondary | ICD-10-CM | POA: Insufficient documentation

## 2016-10-06 DIAGNOSIS — F329 Major depressive disorder, single episode, unspecified: Secondary | ICD-10-CM | POA: Insufficient documentation

## 2016-10-06 DIAGNOSIS — Z87891 Personal history of nicotine dependence: Secondary | ICD-10-CM | POA: Insufficient documentation

## 2016-10-06 DIAGNOSIS — M47812 Spondylosis without myelopathy or radiculopathy, cervical region: Secondary | ICD-10-CM | POA: Insufficient documentation

## 2016-10-06 DIAGNOSIS — F32A Depression, unspecified: Secondary | ICD-10-CM | POA: Insufficient documentation

## 2016-10-06 DIAGNOSIS — M797 Fibromyalgia: Secondary | ICD-10-CM | POA: Insufficient documentation

## 2016-10-06 DIAGNOSIS — K219 Gastro-esophageal reflux disease without esophagitis: Secondary | ICD-10-CM | POA: Insufficient documentation

## 2016-10-06 DIAGNOSIS — M47816 Spondylosis without myelopathy or radiculopathy, lumbar region: Secondary | ICD-10-CM | POA: Insufficient documentation

## 2016-10-06 DIAGNOSIS — M19041 Primary osteoarthritis, right hand: Secondary | ICD-10-CM | POA: Insufficient documentation

## 2016-10-06 DIAGNOSIS — Z96653 Presence of artificial knee joint, bilateral: Secondary | ICD-10-CM | POA: Insufficient documentation

## 2016-10-06 DIAGNOSIS — M19042 Primary osteoarthritis, left hand: Secondary | ICD-10-CM

## 2016-10-06 NOTE — Progress Notes (Deleted)
   Office Visit Note  Patient: Sergio Mitchell             Date of Birth: [birth year]           MRN: [id number]             PCP: No primary care provider on file. Referring: Trey Sailorsoy, Mark M.D. Visit Date: 10/09/2016 Occupation: @GUAROCC @    Subjective:  No chief complaint on file.   History of Present Illness: Sergio Mitchell is a 65 y.o. male ***   Activities of Daily Living:  Patient reports morning stiffness for *** {minute/hour:19697}.   Patient {ACTIONS;DENIES/REPORTS:21021675::"Denies"} nocturnal pain.  Difficulty dressing/grooming: {ACTIONS;DENIES/REPORTS:21021675::"Denies"} Difficulty climbing stairs: {ACTIONS;DENIES/REPORTS:21021675::"Denies"} Difficulty getting out of chair: {ACTIONS;DENIES/REPORTS:21021675::"Denies"} Difficulty using hands for taps, buttons, cutlery, and/or writing: {ACTIONS;DENIES/REPORTS:21021675::"Denies"}   No Rheumatology ROS completed.   PMFS History:  Patient Active Problem List   Diagnosis Date Noted  . Fibromyalgia 10/06/2016  . Primary osteoarthritis of both hands 10/06/2016  . 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 DDDlumbar spine 10/06/2016  . Former smoker 10/06/2016  . Depression 10/06/2016  . Anxiety 10/06/2016  . Atrial fibrillation (HCC) 10/06/2016  . Gastroesophageal reflux disease without esophagitis 10/06/2016    No past medical history on file.  No family history on file. No past surgical history on file. Social History   Social History Narrative  . No narrative on file     Objective: Vital Signs: There were no vitals taken for this visit.   Physical Exam   Musculoskeletal Exam: ***  CDAI Exam: No CDAI exam completed.    Investigation: No additional findings.   Imaging: No results found.  Speciality Comments: No specialty comments available.    Procedures:  No procedures performed Allergies: Patient has no allergy information on record.    Assessment / Plan: Visit Diagnoses: Fibromyalgia  Primary osteoarthritis of both hands  S/P TKR (total knee replacement), bilateral  DDD cervical spine  DDDlumbar spine  Former smoker  Depression, unspecified depression type  Anxiety  Atrial fibrillation, unspecified type (HCC)  Gastroesophageal reflux disease without esophagitis    Orders: No orders of the defined types were placed in this encounter.  No orders of the defined types were placed in this encounter.   Face-to-face time spent with patient was *** minutes. 50% of time was spent in counseling and coordination of care.  Follow-Up Instructions: No Follow-up on file.   Pollyann SavoyShaili Brandie Lopes, MD

## 2016-10-09 ENCOUNTER — Ambulatory Visit: Payer: Self-pay | Admitting: Rheumatology

## 2016-10-09 ENCOUNTER — Encounter: Payer: Self-pay | Admitting: Rheumatology

## 2016-10-09 ENCOUNTER — Ambulatory Visit (INDEPENDENT_AMBULATORY_CARE_PROVIDER_SITE_OTHER): Payer: Medicare Other | Admitting: Rheumatology

## 2016-10-09 VITALS — BP 157/82 | HR 74 | Resp 18 | Ht 74.5 in | Wt 258.0 lb

## 2016-10-09 DIAGNOSIS — F419 Anxiety disorder, unspecified: Secondary | ICD-10-CM | POA: Diagnosis not present

## 2016-10-09 DIAGNOSIS — M797 Fibromyalgia: Secondary | ICD-10-CM

## 2016-10-09 DIAGNOSIS — Z96653 Presence of artificial knee joint, bilateral: Secondary | ICD-10-CM

## 2016-10-09 DIAGNOSIS — F32A Depression, unspecified: Secondary | ICD-10-CM

## 2016-10-09 DIAGNOSIS — K219 Gastro-esophageal reflux disease without esophagitis: Secondary | ICD-10-CM | POA: Diagnosis not present

## 2016-10-09 DIAGNOSIS — M5030 Other cervical disc degeneration, unspecified cervical region: Secondary | ICD-10-CM | POA: Diagnosis not present

## 2016-10-09 DIAGNOSIS — Z87891 Personal history of nicotine dependence: Secondary | ICD-10-CM

## 2016-10-09 DIAGNOSIS — M47812 Spondylosis without myelopathy or radiculopathy, cervical region: Secondary | ICD-10-CM

## 2016-10-09 DIAGNOSIS — M19042 Primary osteoarthritis, left hand: Secondary | ICD-10-CM

## 2016-10-09 DIAGNOSIS — M47816 Spondylosis without myelopathy or radiculopathy, lumbar region: Secondary | ICD-10-CM

## 2016-10-09 DIAGNOSIS — M19041 Primary osteoarthritis, right hand: Secondary | ICD-10-CM

## 2016-10-09 DIAGNOSIS — I4891 Unspecified atrial fibrillation: Secondary | ICD-10-CM | POA: Diagnosis not present

## 2016-10-09 DIAGNOSIS — F329 Major depressive disorder, single episode, unspecified: Secondary | ICD-10-CM

## 2016-10-09 DIAGNOSIS — M545 Low back pain, unspecified: Secondary | ICD-10-CM

## 2016-10-09 MED ORDER — ZOLPIDEM TARTRATE 10 MG PO TABS: 10.0000 mg | ORAL_TABLET | Freq: Every day | ORAL | 1 refills | Status: DC

## 2016-10-09 MED ORDER — METHOCARBAMOL 500 MG PO TABS: 500.0000 mg | ORAL_TABLET | Freq: Three times a day (TID) | ORAL | 1 refills | Status: AC

## 2016-10-09 NOTE — Progress Notes (Signed)
Office Visit Note  Patient: Sergio Mitchell             Date of Birth: 1951/07/21           MRN: [id number]             PCP: Aniceto Boss, MD Referring: No ref. provider found Visit Date: 10/09/2016 Occupation: @    Subjective:  Lower back pain   History of Present Illness: Sergio Mitchell is a 64 y.o. male with history of fibromyalgia syndrome osteoarthritis and disc disease. He states she's been having a lot of discomfort in his lower back in the last 4 months he describes his pain on scale of 0-9 about 9. He states he is unable to walk more than 200 yards time. He also has been having some right lower extremity radiculopathy. He is seeing Dr. Channing Mutters in [place]. And he plans to follow up with him. He states the pain gets worse on a standing for a long time as well. His fibromyalgia has been active but she describes on scale of 0-10 about 7. He has stopped tramadol as it was not helping him. They Ambien has been helpful and he would like a refill on Ambien. He also would like to try water aerobics. He continues to have some discomfort in his hands and his knee joints some. He has had bilateral total knee replacements.  Activities of Daily Living:  Patient reports morning stiffness for 4 hours.   Patient Reports nocturnal pain.  Difficulty dressing/grooming: Denies Difficulty climbing stairs: Reports Difficulty getting out of chair: Reports Difficulty using hands for taps, buttons, cutlery, and/or writing: Reports   Review of Systems  Constitutional: Positive for fatigue. Negative for night sweats and weakness ( ).  HENT: Negative for mouth sores, mouth dryness and nose dryness.   Eyes: Negative for redness and dryness.  Respiratory: Negative for shortness of breath and difficulty breathing.   Cardiovascular: Negative for chest pain, palpitations, hypertension, irregular heartbeat and swelling in legs/feet.  Gastrointestinal: Negative for constipation and diarrhea.  Endocrine:  Negative for increased urination.  Musculoskeletal: Positive for arthralgias, joint pain, myalgias, morning stiffness and myalgias. Negative for joint swelling, muscle weakness and muscle tenderness.  Skin: Negative for color change, rash, hair loss, nodules/bumps, skin tightness, ulcers and sensitivity to sunlight.  Allergic/Immunologic: Negative for susceptible to infections.  Neurological: Negative for dizziness, fainting, memory loss and night sweats.  Hematological: Negative for swollen glands.  Psychiatric/Behavioral: Positive for sleep disturbance. Negative for depressed mood. The patient is not nervous/anxious.     PMFS History:  Patient Active Problem List   Diagnosis Date Noted  . Fibromyalgia 10/06/2016  . Primary osteoarthritis of both hands 10/06/2016  . 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 DDDlumbar spine 10/06/2016  . Former smoker 10/06/2016  . Depression 10/06/2016  . Anxiety 10/06/2016  . Atrial fibrillation (HCC) 10/06/2016  . Gastroesophageal reflux disease without esophagitis 10/06/2016    Past Medical History:  Diagnosis Date  . DDD (degenerative disc disease), lumbar   . Fibromyositis   . Osteoporosis   . Scoliosis   . Spinal stenosis     Family History  Problem Relation Age of Onset  . Scoliosis Mother   . Fibromyalgia Mother   . Prostate cancer Father    Past Surgical History:  Procedure Laterality Date  . CHOLECYSTECTOMY    . REPLACEMENT TOTAL KNEE BILATERAL Bilateral   . TONSILECTOMY, ADENOIDECTOMY, BILATERAL MYRINGOTOMY  AND TUBES     Social History   Social History Narrative  . No narrative on file     Objective: Vital Signs: BP (!) 157/82 (BP Location: Left Arm, Patient Position: Sitting, Cuff Size: Large)   Pulse 74   Resp 18   Ht 6' 2.5" (1.892 m)   Wt 258 lb (117 kg)   BMI 32.68 kg/m    Physical Exam  Constitutional: He is oriented to person, place, and time. He appears  well-developed and well-nourished.  HENT:  Head: Normocephalic and atraumatic.  Eyes: Conjunctivae and EOM are normal. Pupils are equal, round, and reactive to light.  Neck: Normal range of motion. Neck supple.  Cardiovascular: Normal rate, regular rhythm and normal heart sounds.   Pulmonary/Chest: Effort normal and breath sounds normal.  Abdominal: Soft. Bowel sounds are normal.  Neurological: He is alert and oriented to person, place, and time.  Skin: Skin is warm and dry. Capillary refill takes less than 2 seconds.  Psychiatric: He has a normal mood and affect. His behavior is normal.  Nursing note and vitals reviewed.    Musculoskeletal Exam: He takes decreased range of motion his C-spine, thoracic spine, lumbar spine with decreased flexion. Shoulder joints elbow joints are good range of motion. He has thickening of PIP/DIP joints in his hands consistent with osteoarthritis with no synovitis. He is painful range of motion of his bilateral hip joints due to lower back pain. He has some good range of motion of his bilateral knee joints. There was no swelling or ankle joints. Fibromyalgia tender points were 18 out of 18 positive.     CDAI Exam: No CDAI exam completed.    Investigation: No additional findings.   Imaging: No results found.  Speciality Comments: No specialty comments available.    Procedures:  No procedures performed Allergies: Neosporin af [miconazole]; Pantoprazole; and Penicillins   Assessment / Plan:     Visit Diagnoses: Fibromyalgia: He has generalized pain and discomfort myalgias and positive tender points. He requested a prescription for water aerobics which was given to him.  Primary osteoarthritis of both hands: He continues to have some discomfort in his bilateral hands joint protection and muscle strengthening was discussed.  S/P TKR (total knee replacement), bilateral - DR.Caffery: He has some chronic discomfort  DDD cervical spine: He has some  stiffness and discomfort.  DDDlumbar spine: He continues to have a lot of discomfort in his lower back which is interfering with his daily activities. He'll be following with Dr. Channing Muttersoy regarding that.  Chronic insomnia: He is been given prescription for Ambien which was a refill.  He has multiple medical problems which are listed as follows:  Former smoker  Depression, unspecified depression type  Anxiety  Atrial fibrillation, unspecified type (HCC)  Gastroesophageal reflux disease without esophagitis  Low back pain without sciatica, unspecified back pain laterality, unspecified chronicity     Face-to-face time spent with patient was 30 minutes. 50% of time was spent in counseling and coordination of care.  Follow-Up Instructions: Return in about 6 months (around 04/08/2017) for fms, LBP, DDD L-SPINE, .   Pollyann SavoyShaili Ellieanna Funderburg, MD

## 2016-10-12 ENCOUNTER — Telehealth: Payer: Self-pay | Admitting: Rheumatology

## 2016-10-12 NOTE — Telephone Encounter (Signed)
Please resend pt's [facility] [place]

## 2016-10-12 NOTE — Telephone Encounter (Signed)
Patient states pharm has not received rx and he really needs it.

## 2016-10-13 ENCOUNTER — Telehealth: Payer: Self-pay | Admitting: Rheumatology

## 2016-10-13 NOTE — Telephone Encounter (Signed)
This was on Friday, I was not here, will need to review and see what happened to original

## 2016-10-13 NOTE — Telephone Encounter (Signed)
[facility] states [facility] not [facility], please resend.

## 2016-10-13 NOTE — Telephone Encounter (Signed)
This would have printed on Friday, do you think patient was given Rx in the office ? Okay to call in for him since he states not received? I will not call in if you gave him the RX

## 2016-10-13 NOTE — Telephone Encounter (Signed)
Sergio Mitchell, Sergio Mitchell has called and said that he received the [facility], but not [place].  Please call him once you clarify this Rx, thanks

## 2016-10-14 ENCOUNTER — Other Ambulatory Visit: Payer: Self-pay | Admitting: Rheumatology

## 2016-10-14 NOTE — Telephone Encounter (Signed)
Called it in for him

## 2016-10-14 NOTE — Telephone Encounter (Signed)
I have called patient to advise.  

## 2016-10-14 NOTE — Telephone Encounter (Signed)
Patient called for refill of Ambien, he states he wasn't given the rx during his visit. Patient says he can not sleep without this medication; states he hasn't slept since last week.

## 2016-11-04 ENCOUNTER — Other Ambulatory Visit: Payer: Self-pay | Admitting: Rheumatology

## 2016-11-04 NOTE — Telephone Encounter (Signed)
Last Visit: 10/09/16 Next Visit: 04/08/17   Okay to refill Cymbalta?

## 2016-11-04 NOTE — Telephone Encounter (Signed)
ok 

## 2016-11-04 NOTE — Telephone Encounter (Signed)
Last Visit: 10/09/16 Next Visit: 04/08/17   Okay to refill Gabapentin?

## 2017-01-20 ENCOUNTER — Other Ambulatory Visit (INDEPENDENT_AMBULATORY_CARE_PROVIDER_SITE_OTHER): Payer: Self-pay | Admitting: Rheumatology

## 2017-01-20 MED ORDER — DULOXETINE HCL 30 MG PO CPEP: 30.0000 mg | ORAL_CAPSULE | Freq: Every day | ORAL | 1 refills | Status: DC

## 2017-01-20 MED ORDER — GABAPENTIN 300 MG PO CAPS: ORAL_CAPSULE | ORAL | 1 refills | Status: DC

## 2017-01-20 NOTE — Telephone Encounter (Signed)
Last Visit: 10/09/16 Next visit: 04/08/17  Okay to refill Cymbalta and Gabapentin?

## 2017-01-20 NOTE — Telephone Encounter (Signed)
Patient is using the [facility] home delivery for his meds.  Wanted to provide a new fax number.  [phone].  He is requesting Gabapentin and duloxetine

## 2017-01-20 NOTE — Telephone Encounter (Signed)
ok 

## 2017-01-25 ENCOUNTER — Telehealth: Payer: Self-pay | Admitting: Rheumatology

## 2017-01-25 NOTE — Telephone Encounter (Signed)
Patient is requesting [facility]. He provided a fax number of [phone]

## 2017-01-25 NOTE — Telephone Encounter (Signed)
Attempted to contact the patient. Unable to leave a message. Voicemail not set up. Prescription was sent in on 01/20/17.

## 2017-01-26 NOTE — Telephone Encounter (Signed)
Patient advised prescription sent to pharmacy on 01/20/17. Contacted pharmacy on patient's behalf and was advised they have the prescription and patient has a balance and needs to verify some information. Patient advised and will contact pharmacy.

## 2017-02-24 ENCOUNTER — Telehealth: Payer: Self-pay | Admitting: Rheumatology

## 2017-02-24 NOTE — Telephone Encounter (Signed)
We cannot prescribe any pain medications. We have nothing else to offer. He can be referred to pain management through his PCP. He sees Dr. Madelon Lips for his knee joints. He can follow up with him.

## 2017-02-24 NOTE — Telephone Encounter (Addendum)
Called patient to r/s his appointment in May due to Mr. Cephus Shelling schedule change. Patient was ok with rescheduling but was very upset about needing pain medication. I offered patient a sooner appointment and he refused, all he wanted is pain meds. Patient states he cannot walk any further than 40 feet without being "absolutely miserable." He was very rude and was demanding that he get pain medication "a lot stronger than Tramadol or else he is just going to die." He kept going on and on about how he needs a drug much stronger than Tramadol and how tramadol is not a actual narcotic. Patient was demanding that I tell Dr. Corliss Skains to give him pain meds. I advised patient all I could do is simply let her know his request and patient then hung up the phone.

## 2017-02-25 NOTE — Telephone Encounter (Signed)
Attempted to contact the office and left message for patient to call the office.  

## 2017-03-01 ENCOUNTER — Telehealth: Payer: Self-pay | Admitting: Rheumatology

## 2017-03-01 NOTE — Telephone Encounter (Signed)
Patient called stating that he needs what medication he is taking and how often he is taking it to be sent to Dr. Harland Dingwall and he also wants you to know that the doctor did not stop the medication, he did (the patient).  CB#[phone].  Thank you.

## 2017-03-01 NOTE — Telephone Encounter (Signed)
Patient advised we are unable to prescribe any pain medications. Patient advised he can be referred to pain management through his PCP.  Patient verbalized understanding.

## 2017-03-01 NOTE — Telephone Encounter (Signed)
Patient returned your call.  He stated that he voice mail does not always work because of a dead zone.  CB#[phone].  Thank you.

## 2017-03-02 NOTE — Telephone Encounter (Signed)
Attempted to contact the patient and left message for patient to call the office.  

## 2017-03-03 ENCOUNTER — Telehealth: Payer: Self-pay | Admitting: Rheumatology

## 2017-03-03 DIAGNOSIS — M797 Fibromyalgia: Secondary | ICD-10-CM

## 2017-03-03 NOTE — Telephone Encounter (Signed)
ok 

## 2017-03-03 NOTE — Telephone Encounter (Signed)
Patient is requesting a new rx for water aerobics be sent to [facility] Fax# [phone]

## 2017-03-03 NOTE — Telephone Encounter (Signed)
Can we give patient a prescription for water aerobics?

## 2017-03-04 NOTE — Telephone Encounter (Signed)
Left message to advise patent mailing prescription for water aerobics.

## 2017-03-09 ENCOUNTER — Telehealth: Payer: Self-pay | Admitting: Rheumatology

## 2017-03-09 DIAGNOSIS — M5030 Other cervical disc degeneration, unspecified cervical region: Secondary | ICD-10-CM

## 2017-03-09 DIAGNOSIS — M797 Fibromyalgia: Secondary | ICD-10-CM

## 2017-03-09 DIAGNOSIS — M5136 Other intervertebral disc degeneration, lumbar region: Secondary | ICD-10-CM

## 2017-03-09 NOTE — Telephone Encounter (Signed)
Per Dr. Corliss Skains okay to refer patient for PT. Referral placed and sent to [facility].

## 2017-03-09 NOTE — Telephone Encounter (Signed)
Beth from [facility] called stating that the patient is at their facility right now and thought that Dr. Corliss Skains was sending an order to them for his physical therapy.  Their CB#[phone].  Thank you.

## 2017-04-05 ENCOUNTER — Telehealth: Payer: Self-pay | Admitting: Rheumatology

## 2017-04-05 NOTE — Telephone Encounter (Signed)
Patient states he is trying to get in with [facility] at [facility] for pain management, through a PCP referral. He is upset because they told him, they are not prescribing pain medication but doing spine injections instead. Patient states he has tried these injections and Dr. Corliss Skainseveshwar has record of that and he would like for her to communicate with the pain management doctor. He is very upset because he has "not had any pain medication in months."

## 2017-04-06 NOTE — Telephone Encounter (Signed)
Patient called back to let doctor know he stopped Coumadin several weeks ago because it was not helping.

## 2017-04-07 NOTE — Telephone Encounter (Signed)
Attempted to contact the patient and left message for patient to call the office.  

## 2017-04-08 ENCOUNTER — Ambulatory Visit: Payer: Medicare Other | Admitting: Rheumatology

## 2017-04-08 NOTE — Progress Notes (Deleted)
   Office Visit Note  Patient: Sergio Mitchell             Date of Birth: [birth year]           MRN: [id number]             PCP: Aniceto BossSettle, Paul C, MD Referring: Aniceto BossSettle, Paul C, MD Visit Date: 04/15/2017 Occupation: @GUAROCC @    Subjective:  No chief complaint on file.   History of Present Illness: Sergio Mitchell is a 66 y.o. male ***   Activities of Daily Living:  Patient reports morning stiffness for *** {minute/hour:19697}.   Patient {ACTIONS;DENIES/REPORTS:21021675::"Denies"} nocturnal pain.  Difficulty dressing/grooming: {ACTIONS;DENIES/REPORTS:21021675::"Denies"} Difficulty climbing stairs: {ACTIONS;DENIES/REPORTS:21021675::"Denies"} Difficulty getting out of chair: {ACTIONS;DENIES/REPORTS:21021675::"Denies"} Difficulty using hands for taps, buttons, cutlery, and/or writing: {ACTIONS;DENIES/REPORTS:21021675::"Denies"}   No Rheumatology ROS completed.   PMFS History:  Patient Active Problem List   Diagnosis Date Noted  . Fibromyalgia 10/06/2016  . Primary osteoarthritis of both hands 10/06/2016  . 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 DDDlumbar spine 10/06/2016  . Former smoker 10/06/2016  . Depression 10/06/2016  . Anxiety 10/06/2016  . Atrial fibrillation (HCC) 10/06/2016  . Gastroesophageal reflux disease without esophagitis 10/06/2016    Past Medical History:  Diagnosis Date  . DDD (degenerative disc disease), lumbar   . Fibromyositis   . Osteoporosis   . Scoliosis   . Spinal stenosis     Family History  Problem Relation Age of Onset  . Scoliosis Mother   . Fibromyalgia Mother   . Prostate cancer Father    Past Surgical History:  Procedure Laterality Date  . CHOLECYSTECTOMY    . REPLACEMENT TOTAL KNEE BILATERAL Bilateral   . TONSILECTOMY, ADENOIDECTOMY, BILATERAL MYRINGOTOMY AND TUBES     Social History   Social History Narrative  . No narrative on file     Objective: Vital Signs: There were  no vitals taken for this visit.   Physical Exam   Musculoskeletal Exam: ***  CDAI Exam: No CDAI exam completed.    Investigation: No additional findings.    Imaging: No results found.  Speciality Comments: No specialty comments available.    Procedures:  No procedures performed Allergies: Neosporin af [miconazole]; Pantoprazole; and Penicillins   Assessment / Plan:     Visit Diagnoses: DDD cervical spine  DDDlumbar spine  Fibromyalgia  Former smoker  Primary osteoarthritis of both hands  S/P TKR (total knee replacement), bilateral  History of gastroesophageal reflux (GERD)  History of anxiety  History of atrial fibrillation    Orders: No orders of the defined types were placed in this encounter.  No orders of the defined types were placed in this encounter.   Face-to-face time spent with patient was *** minutes. 50% of time was spent in counseling and coordination of care.  Follow-Up Instructions: No Follow-up on file.   Shea Kapur, RT  Note - This record has been created using [employer] software.  Chart creation errors have been sought, but may not always  have been located. Such creation errors do not reflect on  the standard of medical care.

## 2017-04-08 NOTE — Telephone Encounter (Signed)
Patient advised to sign a release of records when he comes to the office next week to have his information faxed to the pain clinic. Patient also advised not to stop Coumadin and to talk with his PCP. patient states he has since restarted it.

## 2017-04-09 ENCOUNTER — Other Ambulatory Visit: Payer: Self-pay | Admitting: Rheumatology

## 2017-04-09 NOTE — Telephone Encounter (Signed)
Prescription authorized by Mr. Leane Callanwala

## 2017-04-09 NOTE — Telephone Encounter (Signed)
Last Visit: 10/09/16 Next Visit: 04/15/17

## 2017-04-15 ENCOUNTER — Ambulatory Visit: Payer: Medicare Other | Admitting: Rheumatology

## 2017-04-20 ENCOUNTER — Encounter: Payer: Self-pay | Admitting: Rheumatology

## 2017-04-20 ENCOUNTER — Ambulatory Visit (INDEPENDENT_AMBULATORY_CARE_PROVIDER_SITE_OTHER): Payer: Medicare Other | Admitting: Rheumatology

## 2017-04-20 VITALS — BP 140/80 | HR 78 | Resp 18 | Ht 74.5 in | Wt 267.0 lb

## 2017-04-20 DIAGNOSIS — G4709 Other insomnia: Secondary | ICD-10-CM | POA: Diagnosis not present

## 2017-04-20 DIAGNOSIS — M47812 Spondylosis without myelopathy or radiculopathy, cervical region: Secondary | ICD-10-CM

## 2017-04-20 DIAGNOSIS — M5030 Other cervical disc degeneration, unspecified cervical region: Secondary | ICD-10-CM

## 2017-04-20 DIAGNOSIS — M797 Fibromyalgia: Secondary | ICD-10-CM | POA: Diagnosis not present

## 2017-04-20 DIAGNOSIS — R5383 Other fatigue: Secondary | ICD-10-CM | POA: Diagnosis not present

## 2017-04-20 DIAGNOSIS — M47816 Spondylosis without myelopathy or radiculopathy, lumbar region: Secondary | ICD-10-CM

## 2017-04-20 DIAGNOSIS — Z96653 Presence of artificial knee joint, bilateral: Secondary | ICD-10-CM

## 2017-04-20 MED ORDER — ZOLPIDEM TARTRATE 10 MG PO TABS: 10.0000 mg | ORAL_TABLET | Freq: Every day | ORAL | 0 refills | Status: DC

## 2017-04-20 MED ORDER — GABAPENTIN 300 MG PO CAPS: ORAL_CAPSULE | ORAL | 1 refills | Status: DC

## 2017-04-20 MED ORDER — DULOXETINE HCL 30 MG PO CPEP: 30.0000 mg | ORAL_CAPSULE | Freq: Every day | ORAL | 1 refills | Status: DC

## 2017-04-20 NOTE — Progress Notes (Signed)
Office Visit Note  Patient: Sergio Mitchell             Date of Birth: September 29, 1951           MRN: [id number]             PCP: Josem Kaufmann, MD Referring: Josem Kaufmann, MD Visit Date: 04/20/2017 Occupation: '@GUAROCC' @    Subjective:  Pain of the Right Knee [facility] with knees flexed ); Pain of the Left Knee; Pain of the Lower Back; Muscle Pain; and Joint Pain (states PCP has referred to pain management )   History of Present Illness: Sergio Mitchell is a 66 y.o. male  Patient in a lot of pain. He reports that his back pain with radiculopathy down the right leg is giving her a lot of poor quality of life. At the last visit in 10/09/2016 when he saw Dr. Estanislado Pandy, he stated that he could walk about 200 yards. Unfortunately today, he can only walk about 40 yards. Patient also reports that the tramadol, which was discontinued, was actually giving him some relief. However, he is not here for that today.  In addition, he has been referred to pain management as follows. He does not know the name of the doctor but he does have the following address for [place] in [place]. [place] [address], [place] [address] Phone: [phone]   C/O of bilateral knee pain.  History of bilateral  Knee replacement.  For some time,he  can't straighten knees full (stays partially flexed) and he saw dr. Carloyn Manner and French Ana a while back and advised that the knee problem is causing more pressure to lower back and exacerbating his pain to his back.  He was offered revision surgery by dr. French Ana but pt not sure if he wants to do that at this time.  He missed visit with our office last week because he misunderstood when when the appt was.  Patient currently feels like his pain as a 10 on a scale of 0-10. He is hoping that we might be able to give him a little bit of tramadol to get him by until he can see his actual pain management doctor. When  his appointment with his office.  He wants our office to forward his office visits from our office to their office and once they get all of that, they will make an appointment to see him.  Although patient is requesting some tramadol from our office, historically, the last time we prescribed tramadol for him was nearly 2 years ago (July 2016). We've also advised the patient that we no longer can give any tramadol approximately January 2017. We've advised them that he can get pain management through his PCP (and it is up to the PCP to decide if they want address his pain issues).  Currently were giving him medication for fibromyalgia: Gabapentin, Cymbalta. And giving him Ambien for his insomnia. I advised the patient that I'll be happy to refill those since he is nearly out.  Activities of Daily Living:  Patient reports morning stiffness for 30 minutes.   Patient Reports nocturnal pain.  Difficulty dressing/grooming: Reports Difficulty climbing stairs: Reports Difficulty getting out of chair: Reports Difficulty using hands for taps, buttons, cutlery, and/or writing: Reports   Review of Systems  Constitutional: Positive for fatigue.  HENT: Negative for mouth sores and mouth dryness.   Eyes: Negative for dryness.  Respiratory: Negative  for shortness of breath.   Gastrointestinal: Negative for constipation and diarrhea.  Musculoskeletal: Positive for myalgias and myalgias.  Skin: Negative for sensitivity to sunlight.  Neurological: Negative for memory loss.  Psychiatric/Behavioral: Positive for sleep disturbance.    PMFS History:  Patient Active Problem List   Diagnosis Date Noted  . Other insomnia 04/20/2017  . Other fatigue 04/20/2017  . Fibromyalgia 10/06/2016  . Primary osteoarthritis of both hands 10/06/2016  . 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 DDDlumbar spine 10/06/2016  . Former smoker 10/06/2016  . Depression  10/06/2016  . Anxiety 10/06/2016  . Atrial fibrillation ([place]) 10/06/2016  . Gastroesophageal reflux disease without esophagitis 10/06/2016    Past Medical History:  Diagnosis Date  . DDD (degenerative disc disease), lumbar   . Fibromyositis   . Osteoporosis   . Scoliosis   . Spinal stenosis     Family History  Problem Relation Age of Onset  . Scoliosis Mother   . Fibromyalgia Mother   . Prostate cancer Father    Past Surgical History:  Procedure Laterality Date  . CHOLECYSTECTOMY    . REPLACEMENT TOTAL KNEE BILATERAL Bilateral   . TONSILECTOMY, ADENOIDECTOMY, BILATERAL MYRINGOTOMY AND TUBES     Social History   Social History Narrative  . No narrative on file     Objective: Vital Signs: BP 140/80   Pulse 78   Resp 18   Ht 6' 2.5" (1.892 m)   Wt 267 lb (121.1 kg)   BMI 33.82 kg/m    Physical Exam  Constitutional: He is oriented to person, place, and time. He appears well-developed and well-nourished.  HENT:  Head: Normocephalic and atraumatic.  Eyes: Conjunctivae and EOM are normal. Pupils are equal, round, and reactive to light.  Neck: Normal range of motion. Neck supple.  Cardiovascular: Normal rate, regular rhythm and normal heart sounds.  Exam reveals no gallop and no friction rub.   No murmur heard. Pulmonary/Chest: Effort normal and breath sounds normal. No respiratory distress. He has no wheezes. He has no rales. He exhibits no tenderness.  Abdominal: Soft. He exhibits no distension and no mass. There is no tenderness. There is no guarding.  Musculoskeletal: Normal range of motion. He exhibits edema.  Lymphadenopathy:    He has no cervical adenopathy.  Neurological: He is alert and oriented to person, place, and time. He exhibits normal muscle tone. Coordination normal.  Skin: Skin is warm and dry. Capillary refill takes less than 2 seconds. No rash noted.  Psychiatric: He has a normal mood and affect. His behavior is normal. Judgment and thought content  normal.  Vitals reviewed.    Musculoskeletal Exam:  Decreased range of motion of bilateral shoulders with only 15 of abduction Decreased extension of bilateral knees with partial flexion (about 15) bilaterally Fibromyalgia tender points are 18 out of 18 positive Grip strength is equal and strong bilaterally  CDAI Exam: No CDAI exam completed.  No synovitis on examination  Investigation: No additional findings.   Last labs are from 10/08/2015. Please see SRS for full details. Include CBC with differential and CMP with GFR and they're within normal limits  Last urine drug screen was 10/11/2014. Please see SRS for full details  No visits with results within 6 Month(s) from this visit.  Latest known visit with results is:  Admission on 08/15/2010, Discharged on 08/17/2010  Component Date Value Ref Range Status  . WBC 08/11/2010 10.0  4.0 - 10.5  K/uL Final  . RBC 08/11/2010 5.06  4.22 - 5.81 MIL/uL Final  . Hemoglobin 08/11/2010 16.6  13.0 - 17.0 g/dL Final  . HCT 08/11/2010 47.5  39.0 - 52.0 % Final  . MCV 08/11/2010 93.9  78.0 - 100.0 fL Final  . MCH 08/11/2010 32.8  26.0 - 34.0 pg Final  . MCHC 08/11/2010 34.9  30.0 - 36.0 g/dL Final  . RDW 08/11/2010 13.3  11.5 - 15.5 % Final  . Platelets 08/11/2010 315  150 - 400 K/uL Final  . Sodium 08/11/2010 140  135 - 145 mEq/L Final  . Potassium 08/11/2010 4.6  3.5 - 5.1 mEq/L Final  . Chloride 08/11/2010 104  96 - 112 mEq/L Final  . CO2 08/11/2010 32  19 - 32 mEq/L Final  . Glucose, Bld 08/11/2010 103* 70 - 99 mg/dL Final  . BUN 08/11/2010 12  6 - 23 mg/dL Final  . Creatinine, Ser 08/11/2010 0.70  0.4 - 1.5 mg/dL Final  . Calcium 08/11/2010 9.7  8.4 - 10.5 mg/dL Final  . Total Protein 08/11/2010 6.5  6.0 - 8.3 g/dL Final  . Albumin 08/11/2010 4.0  3.5 - 5.2 g/dL Final  . AST 08/11/2010 21  0 - 37 U/L Final  . ALT 08/11/2010 21  0 - 53 U/L Final  . Alkaline Phosphatase 08/11/2010 51  39 - 117 U/L Final  . Total Bilirubin  08/11/2010 0.2* 0.3 - 1.2 mg/dL Final  . GFR calc non Af Amer 08/11/2010 >60  >60 mL/min Final  . GFR calc Af Amer 08/11/2010   >60 mL/min Final                   Value:>60                                The eGFR has been calculated                         using the MDRD equation.                         This calculation has not been                         validated in all clinical                         situations.                         eGFR's persistently                         <60 mL/min signify                         possible Chronic Kidney Disease.  Marland Kitchen Neutrophils Relative % 08/11/2010 57  43 - 77 % Final  . Neutro Abs 08/11/2010 5.8  1.7 - 7.7 K/uL Final  . Lymphocytes Relative 08/11/2010 31  12 - 46 % Final  . Lymphs Abs 08/11/2010 3.1  0.7 - 4.0 K/uL Final  . Monocytes Relative 08/11/2010 10  3 - 12 % Final  . Monocytes Absolute 08/11/2010 1.0  0.1 - 1.0 K/uL Final  . Eosinophils Relative 08/11/2010 2  0 -  5 % Final  . Eosinophils Absolute 08/11/2010 0.2  0.0 - 0.7 K/uL Final  . Basophils Relative 08/11/2010 0  0 - 1 % Final  . Basophils Absolute 08/11/2010 0.0  0.0 - 0.1 K/uL Final  . Prothrombin Time 08/11/2010 12.4  11.6 - 15.2 seconds Final  . INR 08/11/2010 0.90  0.00 - 1.49 Final  . aPTT 08/11/2010 28  24 - 37 seconds Final  . MRSA, PCR 08/11/2010 NEGATIVE  NEGATIVE Final  . Staphylococcus aureus 08/11/2010   NEGATIVE Final                   Value:NEGATIVE                                The Xpert SA Assay (FDA                         approved for NASAL specimens                         only), is one component of                         a comprehensive surveillance                         program.  It is not intended                         to diagnose infection nor to                         guide or monitor treatment.  . Color, Urine 08/11/2010 YELLOW  YELLOW Final  . APPearance 08/11/2010 CLEAR  CLEAR Final  . Specific Gravity, Urine 08/11/2010 1.010  1.005 -  1.030 Final  . pH 08/11/2010 7.5  5.0 - 8.0 Final  . Glucose, UA 08/11/2010 NEGATIVE  NEGATIVE mg/dL Final  . Hgb urine dipstick 08/11/2010 NEGATIVE  NEGATIVE Final  . Bilirubin Urine 08/11/2010 NEGATIVE  NEGATIVE Final  . Ketones, ur 08/11/2010 NEGATIVE  NEGATIVE mg/dL Final  . Protein, ur 08/11/2010 NEGATIVE  NEGATIVE mg/dL Final  . Urobilinogen, UA 08/11/2010 0.2  0.0 - 1.0 mg/dL Final  . Nitrite 08/11/2010 NEGATIVE  NEGATIVE Final  . Leukocytes, UA 08/11/2010 NEGATIVE MICROSCOPIC NOT DONE ON URINES WITH NEGATIVE PROTEIN, BLOOD, LEUKOCYTES, NITRITE, OR GLUCOSE <1000 mg/dL.  NEGATIVE Final  . Specimen Description 08/11/2010 URINE, CLEAN CATCH   Final  . Special Requests 08/11/2010 NONE   Final  . Culture  Setup Time 08/11/2010 [id number]   Final  . Colony Count 08/11/2010 NO GROWTH   Final  . Culture 08/11/2010 NO GROWTH   Final  . Report Status 08/11/2010 08/13/2010 FINAL   Final  . ABO/RH(D) 08/11/2010 A POS   Final  . Antibody Screen 08/11/2010 NEG   Final  . Sample Expiration 08/11/2010 08/18/2010   Final  . Sodium 08/16/2010 140  135 - 145 mEq/L Final  . Potassium 08/16/2010 4.3  3.5 - 5.1 mEq/L Final  . Chloride 08/16/2010 103  96 - 112 mEq/L Final  . CO2 08/16/2010 33* 19 - 32 mEq/L Final  . Glucose, Bld 08/16/2010 131* 70 - 99 mg/dL Final  . BUN 08/16/2010 11  6 - 23 mg/dL  Final  . Creatinine, Ser 08/16/2010 0.76  0.4 - 1.5 mg/dL Final  . Calcium 08/16/2010 8.4  8.4 - 10.5 mg/dL Final  . GFR calc non Af Amer 08/16/2010 >60  >60 mL/min Final  . GFR calc Af Amer 08/16/2010   >60 mL/min Final                   Value:>60                                The eGFR has been calculated                         using the MDRD equation.                         This calculation has not been                         validated in all clinical                         situations.                         eGFR's persistently                         <60 mL/min signify                          possible Chronic Kidney Disease.  . WBC 08/16/2010 12.5* 4.0 - 10.5 K/uL Final  . RBC 08/16/2010 3.75* 4.22 - 5.81 MIL/uL Final  . Hemoglobin 08/16/2010 12.0* 13.0 - 17.0 g/dL Final  . HCT 08/16/2010 35.5* 39.0 - 52.0 % Final  . MCV 08/16/2010 94.7  78.0 - 100.0 fL Final  . MCH 08/16/2010 32.0  26.0 - 34.0 pg Final  . MCHC 08/16/2010 33.8  30.0 - 36.0 g/dL Final  . RDW 08/16/2010 13.5  11.5 - 15.5 % Final  . Platelets 08/16/2010 229  150 - 400 K/uL Final  . Sodium 08/17/2010 137  135 - 145 mEq/L Final  . Potassium 08/17/2010 4.1  3.5 - 5.1 mEq/L Final  . Chloride 08/17/2010 100  96 - 112 mEq/L Final  . CO2 08/17/2010 32  19 - 32 mEq/L Final  . Glucose, Bld 08/17/2010 129* 70 - 99 mg/dL Final  . BUN 08/17/2010 12  6 - 23 mg/dL Final  . Creatinine, Ser 08/17/2010 0.69  0.4 - 1.5 mg/dL Final  . Calcium 08/17/2010 8.7  8.4 - 10.5 mg/dL Final  . GFR calc non Af Amer 08/17/2010 >60  >60 mL/min Final  . GFR calc Af Amer 08/17/2010   >60 mL/min Final                   Value:>60                                The eGFR has been calculated                         using the MDRD equation.  This calculation has not been                         validated in all clinical                         situations.                         eGFR's persistently                         <60 mL/min signify                         possible Chronic Kidney Disease.  . WBC 08/17/2010 14.0* 4.0 - 10.5 K/uL Final  . RBC 08/17/2010 3.74* 4.22 - 5.81 MIL/uL Final  . Hemoglobin 08/17/2010 11.8* 13.0 - 17.0 g/dL Final  . HCT 08/17/2010 35.5* 39.0 - 52.0 % Final  . MCV 08/17/2010 94.9  78.0 - 100.0 fL Final  . MCH 08/17/2010 31.6  26.0 - 34.0 pg Final  . MCHC 08/17/2010 33.2  30.0 - 36.0 g/dL Final  . RDW 08/17/2010 13.5  11.5 - 15.5 % Final  . Platelets 08/17/2010 224  150 - 400 K/uL Final     Imaging: No results found.  Speciality Comments: No specialty comments  available.    Procedures:  No procedures performed Allergies: Neosporin af [miconazole]; Pantoprazole; and Penicillins   Assessment / Plan:     Visit Diagnoses: Fibromyalgia  Other insomnia  Other fatigue  DDD cervical spine  DDDlumbar spine  S/P TKR (total knee replacement), bilateral   Plan: #1: Fibromyalgia. Rates his discomfort as 10 on a scale of 0-10. Using Cymbalta and gabapentin. Unfortunately, he is having exacerbation to his back and neck due to DDD of the C and L-spine as well as unable to fully extend bilateral knees. Since she is unable to walk well, he has gained a fair amount of weight according to the patient. That is causing him additional burden on his body as it relates the pain  #2: Insomnia. Using Ambien with good relief  #3: DDD of the C-spine and DDD of the L-spine. Seeing Dr. Carloyn Manner in [place].  #4: Bilateral knee joint pain. History of bilateral total knee replacement by Dr. Flossie Dibble. Because he cannot fully extend both of his knees, Dr. French Ana advised him a revision surgery. Patient is uncertain whether he wants to move forward with that but does state that because of the poor extension of bilateral knees, his DDD of the L-spine and C-spine is exacerbated.  #5 pain. Plans to see pain management. His PCP referred him to [facility] pain management. Patient needs to sign a medical release form so we can send all records to those doctors. He does not have an appointment with them yet.  #6: Refill Ambien, gabapentin, Cymbalta and send it to his mail order pharmacy  #7: Return to clinic on a when necessary basis. Were unable to offer him anything more at this time. He will need to decide on what he wants to do for his knee issues through Dr. French Ana. He will let them decide what he wants to do for his neck and back issues to Dr. Rex Kras office. He will get pain control through his pain management doctor (appointment still pending but  patient plans to get them  all of his medical records so they can address his issues).  Orders: No orders of the defined types were placed in this encounter.  No orders of the defined types were placed in this encounter.   Face-to-face time spent with patient was 30 minutes. 50% of time was spent in counseling and coordination of care.  Follow-Up Instructions: No Follow-up on file.   Eliezer Lofts, PA-C  Note - This record has been created using [employer].  Chart creation errors have been sought, but may not always  have been located. Such creation errors do not reflect on  the standard of medical care.

## 2017-04-23 ENCOUNTER — Telehealth (INDEPENDENT_AMBULATORY_CARE_PROVIDER_SITE_OTHER): Payer: Self-pay | Admitting: Rheumatology

## 2017-04-23 NOTE — Telephone Encounter (Signed)
Copy of All records mailed (too many to fax) to [facility]

## 2017-05-11 ENCOUNTER — Other Ambulatory Visit: Payer: Self-pay | Admitting: *Deleted

## 2017-05-11 MED ORDER — METHOCARBAMOL 500 MG PO TABS: 500.0000 mg | ORAL_TABLET | Freq: Three times a day (TID) | ORAL | 0 refills | Status: DC

## 2017-05-11 NOTE — Telephone Encounter (Addendum)
Refill request received via fax  Last Visit: 04/20/17 No follow up scheduled  Okay to refill Robaxin?

## 2017-05-14 ENCOUNTER — Encounter: Payer: Self-pay | Admitting: Rheumatology

## 2017-05-14 NOTE — Progress Notes (Signed)
   We received a fax from [facility]. They say that methocarbamol is on back order.  I called patient to find out if he is okay with us substituting some Skelaxin until methocarbamol becomes available. Left message on his machine.  For now, I will send in a prescription for Skelaxin 800 mg; one tab twice a day when necessary Dispense 60 pills with 2 refills

## 2017-05-21 ENCOUNTER — Telehealth: Payer: Self-pay | Admitting: Pharmacist

## 2017-05-21 NOTE — Telephone Encounter (Signed)
[facility] skelaxin prescription.  This medication requires prior approval.  I discussed with Dr. Corliss Skainseveshwar who recommended that patient's pain management take over prescription for his muscle relaxer.  I called patient and advised him of this information.  He voiced understanding.   Lilla Shookachel Henderson, Pharm.D., BCPS, CPP Clinical Pharmacist Pager: [phone] Phone: [phone] 05/21/2017 12:56 PM

## 2017-07-10 ENCOUNTER — Other Ambulatory Visit: Payer: Self-pay | Admitting: Rheumatology

## 2017-07-12 NOTE — Telephone Encounter (Signed)
ok 

## 2017-07-12 NOTE — Telephone Encounter (Signed)
Last visit was 03/2017  Message sent to front desk Pt  Needs follow up Nov/ Dec asked them to please call to make appt for patient   Ok to refill Ambien?

## 2017-08-19 ENCOUNTER — Telehealth: Payer: Self-pay | Admitting: Rheumatology

## 2017-08-19 NOTE — Telephone Encounter (Signed)
Left message on machine for patient to call back to schedule follow up appointment.  

## 2017-08-19 NOTE — Telephone Encounter (Signed)
-----   Message from Caffie Damme, RT sent at 07/12/2017  9:02 AM EDT ----- Pt  Needs follow up Nov/ Dec please call to make appt

## 2017-10-18 ENCOUNTER — Other Ambulatory Visit: Payer: Self-pay | Admitting: Rheumatology

## 2017-10-18 NOTE — Telephone Encounter (Signed)
Last visit: 04/17/2017 Next visit: 02/02/2018  Okay to refill Ambien?

## 2017-10-18 NOTE — Telephone Encounter (Signed)
ok 

## 2017-10-25 ENCOUNTER — Telehealth: Payer: Self-pay

## 2017-10-25 NOTE — Telephone Encounter (Signed)
Received a prior authorization request for Zolpidem from [employer] via cover my meds. Authorization was submited. Will update once we receive a response.   Roni Friberg, [place], CPhT 1:45 PM

## 2017-10-27 NOTE — Telephone Encounter (Signed)
[facility] a fax from [facility] regarding a prior authorization approval for ZOLPIDEM from 10/25/2017 to 10/25/2018.   Reference [id number]:[id number] Phone number: [phone]  Will send document to scan center. Called patient to update. Left message.   Sergio Mitchell, Sergio [place], CPhT 10:38 AM

## 2018-01-20 NOTE — Progress Notes (Signed)
Office Visit Note  Patient: Sergio Mitchell             Date of Birth: [birth year]           MRN: [id number]             PCP: Aniceto BossSettle, Paul C, MD Referring: Aniceto BossSettle, Paul C, MD Visit Date: 02/02/2018 Occupation: @GUAROCC @    Subjective:  Pain in multiple joints    History of Present Illness: Sergio Mitchell is a 67 y.o. male with history of fibromyalgia, osteoarthritis, and DDD.  Patient states he has been going to pain management and is now on hydrocodone.  He states that he feels the Zanaflex 4 mg tablets are too strong, and he has been breaking his tablets in half.  He would like 2 mg tablets to be sent in with his next refill.  He continues to take Cymbalta 30 mg daily and Gabapentin 300 mg 4 tablets daily.  He takes Ambien 10 mg at bedtime.   Patient states his fibromyalgia has been causing increased generalized pain.  He states he has pain in bilateral hands and occasional hand swelling. He has been unable to exercise due to his increased pain and fatigue.  He states he has also gained quite a bit of weight.  Patient states he has chronic lower back pain as well as neck pain.  He is planning to schedule follow-up visit with Dr. Channing Muttersoy to discuss options for his lower back pain.  He states he has a lot of stiffness in his back and get some relief when leaning forward. He states he has occasional pain in his bilateral knees.    Patient states he has been having to walk with a cane.  He reports he has fluid retention in bilateral lower extremities. He is going to follow up with his cardiologist.   Activities of Daily Living:  Patient reports morning stiffness for 2 hours.   Patient Reports nocturnal pain.  Difficulty dressing/grooming: Denies Difficulty climbing stairs: Reports Difficulty getting out of chair: Reports Difficulty using hands for taps, buttons, cutlery, and/or writing: Reports   Review of Systems  Constitutional: Positive for fatigue. Negative for night sweats and weakness.    HENT: Positive for mouth dryness. Negative for mouth sores, trouble swallowing, trouble swallowing and nose dryness.   Eyes: Positive for redness. Negative for photophobia, visual disturbance and dryness.  Respiratory: Negative for cough, hemoptysis, shortness of breath and difficulty breathing.   Cardiovascular: Positive for swelling in legs/feet. Negative for chest pain, palpitations, hypertension and irregular heartbeat.  Gastrointestinal: Positive for constipation. Negative for blood in stool and diarrhea.  Endocrine: Negative for increased urination.  Genitourinary: Negative for painful urination.  Musculoskeletal: Positive for arthralgias, joint pain, joint swelling, myalgias, morning stiffness, muscle tenderness and myalgias. Negative for muscle weakness.  Skin: Negative for color change, rash, hair loss, nodules/bumps, skin tightness, ulcers and sensitivity to sunlight.  Allergic/Immunologic: Negative for susceptible to infections.  Neurological: Negative for dizziness, fainting, memory loss and night sweats.  Hematological: Negative for swollen glands.  Psychiatric/Behavioral: Negative for depressed mood and sleep disturbance. The patient is not nervous/anxious.     PMFS History:  Patient Active Problem List   Diagnosis Date Noted  . Other insomnia 04/20/2017  . Other fatigue 04/20/2017  . Fibromyalgia 10/06/2016  . Primary osteoarthritis of both hands 10/06/2016  . 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 DDDlumbar spine 10/06/2016  . Former smoker  10/06/2016  . Depression 10/06/2016  . Anxiety 10/06/2016  . Atrial fibrillation (HCC) 10/06/2016  . Gastroesophageal reflux disease without esophagitis 10/06/2016    Past Medical History:  Diagnosis Date  . DDD (degenerative disc disease), lumbar   . Fibromyositis   . Osteoporosis   . Scoliosis   . Spinal stenosis     Family History  Problem Relation Age of Onset  .  Scoliosis Mother   . Fibromyalgia Mother   . Prostate cancer Father    Past Surgical History:  Procedure Laterality Date  . CHOLECYSTECTOMY    . JOINT REPLACEMENT    . REPLACEMENT TOTAL KNEE BILATERAL Bilateral   . TONSILECTOMY, ADENOIDECTOMY, BILATERAL MYRINGOTOMY AND TUBES     Social History   Social History Narrative  . Not on file     Objective: Vital Signs: BP 128/73 (BP Location: Left Arm, Patient Position: Sitting, Cuff Size: Normal)   Pulse 68   Resp 19   Ht 6\' 2"  (1.88 m)   Wt 270 lb (122.5 kg)   BMI 34.67 kg/m    Physical Exam  Constitutional: He is oriented to person, place, and time. He appears well-developed and well-nourished.  HENT:  Head: Normocephalic and atraumatic.  Eyes: Conjunctivae and EOM are normal. Pupils are equal, round, and reactive to light.  Neck: Normal range of motion. Neck supple.  Cardiovascular: Normal rate, regular rhythm and normal heart sounds.  Pulmonary/Chest: Effort normal and breath sounds normal.  Abdominal: Soft. Bowel sounds are normal.  Neurological: He is alert and oriented to person, place, and time.  Skin: Skin is warm and dry. Capillary refill takes less than 2 seconds.  Psychiatric: He has a normal mood and affect. His behavior is normal.  Nursing note and vitals reviewed.    Musculoskeletal Exam: C-spine limited range of motion with lateral rotation. Limited ROM of thoracic and lumbar spine with discomfort. Midline spinal tenderness in lumbar region. Shoulder joints, elbow joints, wrist joints, MCPs, PIPs, and DIPs good ROM with no synovitis.  He has edema in bilateral hands.  He has synovial thickening of PIP and DIP synovial thickening.  Hip joints, knee joints, ankle joints, MTPs, PIPs, and DIPs good ROM with no synovitis.  No warmth or effusion of knee joints.  No knee crepitus.  Pedal edema bilaterally. No trochanteric bursa tenderness.    CDAI Exam: No CDAI exam completed.    Investigation: No additional  findings.   Imaging: No results found.  Speciality Comments: No specialty comments available.    Procedures:  No procedures performed Allergies: Cefdinir; Neosporin af [miconazole]; Pantoprazole; and Penicillins   Assessment / Plan:     Visit Diagnoses: Fibromyalgia: He has generalized hyperalgesia on exam. He has muscle tenderness and muscle tension.  He continues to have significant fatigue. He was referred to pain management, and he is now taking Hydrocodone.  His pain has been better managed since starting on Hydrocodone.  He continues to take Cymbalta 20 mg, Gabapentin 300 mg 4 tablets daily, and Zanaflex 2 mg.  Patient requests Zanaflex 2 mg tablets with his next refill.  He has been breaking his 4 mg tablets in half.  He takes Ambien 10 mg tablets for insomnia.  He was encouraged to try to start exercising on a regular basis.    Primary osteoarthritis of both hands: She has PIP and DIP synovial thickening consistent with osteoarthritis.  He has discomfort in his bilateral hands.    S/P TKR (total knee replacement), bilateral - No  warmth or effusion.  He experiences occasional discomfort at times.    DDD (degenerative disc disease), cervical: Chronic pain. He has limited ROM with lateral rotation.   DDD (degenerative disc disease), lumbar: He has midline spinal tenderness.  He is going to make a follow up appointment with Dr. Channing Mutters.    Chronic insomnia: He takes Ambien 10 mg at bedtime. Good sleep hygiene was discussed.   Other medical conditions as follows:   History of gastroesophageal reflux (GERD)  Former smoker  History of atrial fibrillation  History of depression  History of anxiety    Orders: No orders of the defined types were placed in this encounter.  Meds ordered this encounter  Medications  . diclofenac sodium (VOLTAREN) 1 % GEL    Sig: Apply three grams to three large joints up to three times a day.    Dispense:  3 Tube    Refill:  3     Face-to-face time spent with patient was 30 minutes. >50% of time was spent in counseling and coordination of care.  Follow-Up Instructions: Return in about 1 year (around 02/03/2019) for Fibromyalgia, Osteoarthritis, DDD.   Gearldine Bienenstock, PA-C  Note - This record has been created using Dragon software.  Chart creation errors have been sought, but may not always  have been located. Such creation errors do not reflect on  the standard of medical care.

## 2018-01-25 ENCOUNTER — Other Ambulatory Visit: Payer: Self-pay | Admitting: Rheumatology

## 2018-01-26 NOTE — Telephone Encounter (Signed)
Last visit: 04/17/2017 Next visit: 02/02/2018  Okay to refill Ambien?    

## 2018-02-02 ENCOUNTER — Encounter: Payer: Self-pay | Admitting: Rheumatology

## 2018-02-02 ENCOUNTER — Ambulatory Visit (INDEPENDENT_AMBULATORY_CARE_PROVIDER_SITE_OTHER): Payer: Medicare Other | Admitting: Rheumatology

## 2018-02-02 VITALS — BP 128/73 | HR 68 | Resp 19 | Ht 74.0 in | Wt 270.0 lb

## 2018-02-02 DIAGNOSIS — M19041 Primary osteoarthritis, right hand: Secondary | ICD-10-CM

## 2018-02-02 DIAGNOSIS — Z8719 Personal history of other diseases of the digestive system: Secondary | ICD-10-CM | POA: Diagnosis not present

## 2018-02-02 DIAGNOSIS — M797 Fibromyalgia: Secondary | ICD-10-CM

## 2018-02-02 DIAGNOSIS — Z8679 Personal history of other diseases of the circulatory system: Secondary | ICD-10-CM

## 2018-02-02 DIAGNOSIS — M5030 Other cervical disc degeneration, unspecified cervical region: Secondary | ICD-10-CM

## 2018-02-02 DIAGNOSIS — M5136 Other intervertebral disc degeneration, lumbar region: Secondary | ICD-10-CM

## 2018-02-02 DIAGNOSIS — Z8659 Personal history of other mental and behavioral disorders: Secondary | ICD-10-CM

## 2018-02-02 DIAGNOSIS — Z96653 Presence of artificial knee joint, bilateral: Secondary | ICD-10-CM

## 2018-02-02 DIAGNOSIS — Z87891 Personal history of nicotine dependence: Secondary | ICD-10-CM

## 2018-02-02 DIAGNOSIS — M19042 Primary osteoarthritis, left hand: Secondary | ICD-10-CM

## 2018-02-02 DIAGNOSIS — F5104 Psychophysiologic insomnia: Secondary | ICD-10-CM

## 2018-02-02 MED ORDER — DICLOFENAC SODIUM 1 % TD GEL: TRANSDERMAL | 3 refills | Status: DC

## 2018-04-06 ENCOUNTER — Other Ambulatory Visit: Payer: Self-pay | Admitting: Physician Assistant

## 2018-06-11 ENCOUNTER — Other Ambulatory Visit: Payer: Self-pay | Admitting: Physician Assistant

## 2018-06-13 NOTE — Telephone Encounter (Signed)
Last Visit: 02/02/18 Next Visit: 02/07/19   Okay to refill Ambien? 

## 2018-07-11 ENCOUNTER — Other Ambulatory Visit: Payer: Self-pay | Admitting: *Deleted

## 2018-07-11 MED ORDER — DULOXETINE HCL 30 MG PO CPEP: 30.0000 mg | ORAL_CAPSULE | Freq: Every day | ORAL | 1 refills | Status: DC

## 2018-07-11 MED ORDER — GABAPENTIN 300 MG PO CAPS: ORAL_CAPSULE | ORAL | 1 refills | Status: DC

## 2018-07-11 NOTE — Telephone Encounter (Signed)
Refill request received via fax  Last Visit: 02/02/18 Next Visit: 02/07/19   Okay to refill per Dr. Corliss Skainseveshwar

## 2018-09-26 ENCOUNTER — Other Ambulatory Visit: Payer: Self-pay | Admitting: Physician Assistant

## 2018-09-26 NOTE — Telephone Encounter (Signed)
Ok to refill 

## 2018-09-26 NOTE — Telephone Encounter (Signed)
Last Visit: 02/02/18 Next Visit: 02/07/19   Okay to refill Ambien? 

## 2018-12-13 ENCOUNTER — Other Ambulatory Visit (HOSPITAL_COMMUNITY): Payer: Self-pay | Admitting: Nurse Practitioner

## 2018-12-13 DIAGNOSIS — M4848XA Fatigue fracture of vertebra, sacral and sacrococcygeal region, initial encounter for fracture: Secondary | ICD-10-CM

## 2018-12-21 ENCOUNTER — Ambulatory Visit (HOSPITAL_COMMUNITY)
Admission: RE | Admit: 2018-12-21 | Discharge: 2018-12-21 | Disposition: A | Discharge status: Home / Self Care | Payer: Medicare Other | Source: Ambulatory Visit | Attending: Nurse Practitioner | Admitting: Nurse Practitioner

## 2018-12-21 DIAGNOSIS — M4848XA Fatigue fracture of vertebra, sacral and sacrococcygeal region, initial encounter for fracture: Secondary | ICD-10-CM | POA: Insufficient documentation

## 2019-01-06 ENCOUNTER — Other Ambulatory Visit: Payer: Self-pay | Admitting: Physician Assistant

## 2019-01-06 NOTE — Telephone Encounter (Signed)
Last Visit: 02/02/18 Next Visit: 02/07/19   Okay to refill Ambien?

## 2019-01-24 NOTE — Progress Notes (Signed)
Office Visit Note  Patient: Sergio Mitchell             Date of Birth: 02/19/1951           MRN: [id number]             PCP: Josem Kaufmann, MD Referring: Josem Kaufmann, MD Visit Date: 02/07/2019 Occupation: _0 @  Subjective:  Lower back pain   History of Present Illness: Sergio Mitchell is a 68 y.o. male with history of fibromyalgia, osteoarthritis, and DDD. He reports he continues to have chronic lower back pain.  He denies seeing a spine specialist in the past. He would like a referral today. He continues to have generalized muscle aches and muscle tenderness.  He reports he lives in constant pain. He continues to take Cymbalta 30 mg po daily and Gabapentin 300 mg 4 times daily. He requested 90-supply of refills sent today. He takes ambien 10 mg po at bedtime.  He continues to talk with a cane.    Activities of Daily Living:  Patient reports morning stiffness   all day.   Patient Reports nocturnal pain.  Difficulty dressing/grooming: Denies Difficulty climbing stairs: Reports Difficulty getting out of chair: Reports Difficulty using hands for taps, buttons, cutlery, and/or writing: Denies  Review of Systems  Constitutional: Negative for fatigue and night sweats.  HENT: Positive for mouth dryness. Negative for mouth sores and nose dryness.   Eyes: Negative for redness, visual disturbance and dryness.  Respiratory: Negative for cough, hemoptysis, shortness of breath and difficulty breathing.   Cardiovascular: Positive for swelling in legs/feet. Negative for chest pain, palpitations, hypertension and irregular heartbeat.  Gastrointestinal: Negative for blood in stool, constipation and diarrhea.  Endocrine: Negative for increased urination.  Genitourinary: Negative for painful urination.  Musculoskeletal: Positive for arthralgias, joint pain, myalgias, muscle tenderness and myalgias. Negative for joint swelling, muscle weakness and morning stiffness.  Skin: Negative for color  change, rash, hair loss, nodules/bumps, skin tightness, ulcers and sensitivity to sunlight.  Allergic/Immunologic: Negative for susceptible to infections.  Neurological: Negative for dizziness, fainting, memory loss, night sweats and weakness.  Hematological: Negative for swollen glands.  Psychiatric/Behavioral: Positive for sleep disturbance. Negative for depressed mood. The patient is not nervous/anxious.     PMFS History:  Patient Active Problem List   Diagnosis Date Noted  . Other insomnia 04/20/2017  . Other fatigue 04/20/2017  . Fibromyalgia 10/06/2016  . Primary osteoarthritis of both hands 10/06/2016  . 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 DDDlumbar spine 10/06/2016  . Former smoker 10/06/2016  . Depression 10/06/2016  . Anxiety 10/06/2016  . Atrial fibrillation ([place]) 10/06/2016  . Gastroesophageal reflux disease without esophagitis 10/06/2016    Past Medical History:  Diagnosis Date  . DDD (degenerative disc disease), lumbar   . Fibromyositis   . Osteoporosis   . Scoliosis   . Spinal stenosis     Family History  Problem Relation Age of Onset  . Scoliosis Mother   . Fibromyalgia Mother   . Prostate cancer Father    Past Surgical History:  Procedure Laterality Date  . CHOLECYSTECTOMY    . JOINT REPLACEMENT    . REPLACEMENT TOTAL KNEE BILATERAL Bilateral   . TONSILECTOMY, ADENOIDECTOMY, BILATERAL MYRINGOTOMY AND TUBES     Social History   Social History Narrative  . Not on file    There is no immunization history on file for this patient.   Objective: Vital  Signs: BP 129/82 (BP Location: Left Arm, Patient Position: Sitting, Cuff Size: Large)   Pulse 68   Resp 17   Ht 5' 11.5" (1.816 m)   Wt 271 lb 12.8 oz (123.3 kg)   BMI 37.38 kg/m    Physical Exam Vitals signs and nursing note reviewed.  Constitutional:      Appearance: He is well-developed.  HENT:     Head: Normocephalic and atraumatic.    Eyes:     Conjunctiva/sclera: Conjunctivae normal.     Pupils: Pupils are equal, round, and reactive to light.  Neck:     Musculoskeletal: Normal range of motion and neck supple.  Cardiovascular:     Rate and Rhythm: Normal rate and regular rhythm.     Heart sounds: Normal heart sounds.  Pulmonary:     Effort: Pulmonary effort is normal.     Breath sounds: Normal breath sounds.  Abdominal:     General: Bowel sounds are normal.     Palpations: Abdomen is soft.  Lymphadenopathy:     Cervical: No cervical adenopathy.  Skin:    General: Skin is warm and dry.     Capillary Refill: Capillary refill takes less than 2 seconds.  Neurological:     Mental Status: He is alert and oriented to person, place, and time.  Psychiatric:        Behavior: Behavior normal.      Musculoskeletal Exam: C-spine good ROM. Right shoulder good ROM. Left shoulder limited abduction and forward flexion to 90 degrees.  Elbow joints, wrist joints, MCPs, PIPs, and DIPs good ROM with no synovitis.  DIP synovial thickening.  Complete fist formation bilaterally. Hip joints good ROM.  Knee joints, ankle joints, MTPs, PIPs, and DIPs good ROM with no synovitis.  No warmth or effusion of knee joints.  Pedal edema bilaterally.    CDAI Exam: CDAI Score: Not documented Patient Global Assessment: Not documented; Provider Global Assessment: Not documented Swollen: Not documented; Tender: Not documented Joint Exam   Not documented   There is currently no information documented on the homunculus. Go to the Rheumatology activity and complete the homunculus joint exam.  Investigation: No additional findings.  Imaging: No results found.  Recent Labs: Lab Results  Component Value Date   WBC 14.0 (H) 08/17/2010   HGB 11.8 (L) 08/17/2010   PLT 224 08/17/2010   NA 137 08/17/2010   K 4.1 08/17/2010   CL 100 08/17/2010   CO2 32 08/17/2010   GLUCOSE 129 (H) 08/17/2010   BUN 12 08/17/2010   CREATININE 0.69 08/17/2010    BILITOT 0.2 (L) 08/11/2010   ALKPHOS 51 08/11/2010   AST 21 08/11/2010   ALT 21 08/11/2010   PROT 6.5 08/11/2010   ALBUMIN 4.0 08/11/2010   CALCIUM 8.7 08/17/2010   GFRAA  08/17/2010    >60        The eGFR has been calculated using the MDRD equation. This calculation has not been validated in all clinical situations. eGFR's persistently <60 mL/min signify possible Chronic Kidney Disease.    Speciality Comments: No specialty comments available.  Procedures:  No procedures performed Allergies: Cefdinir; Neosporin af [miconazole]; Pantoprazole; and Penicillins   Assessment / Plan:     Visit Diagnoses: Fibromyalgia -He has generalized hyperalgesia and positive tender points on exam.  He continues have generalized muscle aches and muscle tenderness due to fibromyalgia.  He takes gabapentin 300 mg 4 times daily and Cymbalta 30 mg by mouth daily.  He is no longer  taking Zanaflex.  He requested refills of Cymbalta and gabapentin to be sent to the pharmacy.  He has chronic neck and lower back pain and he will be referred to Dr. Louanne Skye for further evaluation.  Primary osteoarthritis of both hands: He has DIP synovial thickening consistent with osteoarthritis of bilateral hands.  He has no synovitis.  He has complete fist formation bilaterally.  Joint protection and muscle strengthening were discussed.  S/P TKR (total knee replacement), bilateral: No warmth or effusion.  Good range of motion.  He has no discomfort at this time.  He continues to walk with a cane for support.  DDD (degenerative disc disease), cervical: He has limited range of motion with discomfort.  He has been having increased neck pain recently.  He does not have any new symptoms of radiculopathy at this time.   DDD (degenerative disc disease), lumbar: Chronic pain.  Midline spinal tenderness in lumbar region.  He has limited range of motion.  He has been having significant lower back discomfort.  Will refer him to Dr.  Louanne Skye for further evaluation.  Chronic insomnia - He takes Ambien 10 mg at bedtime.   Other medical conditions are listed as follows:   History of anxiety  History of depression  History of gastroesophageal reflux (GERD)  History of atrial fibrillation  Former smoker   Orders: No orders of the defined types were placed in this encounter.  Meds ordered this encounter  Medications  . gabapentin (NEURONTIN) 300 MG capsule    Sig: TAKE ONE CAPSULE BY MOUTH 4 TIMES DAILY    Dispense:  360 capsule    Refill:  0  . DULoxetine (CYMBALTA) 30 MG capsule    Sig: Take 1 capsule (30 mg total) by mouth daily.    Dispense:  90 capsule    Refill:  0     Follow-Up Instructions: Return in about 1 year (around 02/07/2020) for Fibromyalgia, Osteoarthritis, DDD.   Ofilia Neas, PA-C  I examined and evaluated the patient with Hazel Sams PA.  Patient had no synovitis on examination.  He continues to have discomfort underlying osteoarthritis and degenerative disc disease.  Per his request prescription refills were given.  He also brought a copy of his bone density which was within normal limits.  The plan of care was discussed as noted above.  Bo Merino, MD Note - This record has been created using Editor, commissioning.  Chart creation errors have been sought, but may not always  have been located. Such creation errors do not reflect on  the standard of medical care.

## 2019-02-07 ENCOUNTER — Telehealth: Payer: Self-pay | Admitting: Rheumatology

## 2019-02-07 ENCOUNTER — Encounter: Payer: Self-pay | Admitting: Rheumatology

## 2019-02-07 ENCOUNTER — Other Ambulatory Visit: Payer: Self-pay

## 2019-02-07 ENCOUNTER — Ambulatory Visit (INDEPENDENT_AMBULATORY_CARE_PROVIDER_SITE_OTHER): Payer: Medicare Other | Admitting: Rheumatology

## 2019-02-07 VITALS — BP 129/82 | HR 68 | Resp 17 | Ht 71.5 in | Wt 271.8 lb

## 2019-02-07 DIAGNOSIS — Z96653 Presence of artificial knee joint, bilateral: Secondary | ICD-10-CM | POA: Diagnosis not present

## 2019-02-07 DIAGNOSIS — Z8659 Personal history of other mental and behavioral disorders: Secondary | ICD-10-CM

## 2019-02-07 DIAGNOSIS — Z87891 Personal history of nicotine dependence: Secondary | ICD-10-CM

## 2019-02-07 DIAGNOSIS — M5136 Other intervertebral disc degeneration, lumbar region: Secondary | ICD-10-CM

## 2019-02-07 DIAGNOSIS — M19042 Primary osteoarthritis, left hand: Secondary | ICD-10-CM

## 2019-02-07 DIAGNOSIS — M797 Fibromyalgia: Secondary | ICD-10-CM

## 2019-02-07 DIAGNOSIS — F5104 Psychophysiologic insomnia: Secondary | ICD-10-CM

## 2019-02-07 DIAGNOSIS — Z8679 Personal history of other diseases of the circulatory system: Secondary | ICD-10-CM

## 2019-02-07 DIAGNOSIS — M5030 Other cervical disc degeneration, unspecified cervical region: Secondary | ICD-10-CM

## 2019-02-07 DIAGNOSIS — Z8719 Personal history of other diseases of the digestive system: Secondary | ICD-10-CM

## 2019-02-07 DIAGNOSIS — Z79899 Other long term (current) drug therapy: Secondary | ICD-10-CM

## 2019-02-07 DIAGNOSIS — M19041 Primary osteoarthritis, right hand: Secondary | ICD-10-CM

## 2019-02-07 MED ORDER — DULOXETINE HCL 30 MG PO CPEP: 30.0000 mg | ORAL_CAPSULE | Freq: Every day | ORAL | 0 refills | Status: DC

## 2019-02-07 MED ORDER — GABAPENTIN 300 MG PO CAPS: ORAL_CAPSULE | ORAL | 0 refills | Status: DC

## 2019-02-07 NOTE — Telephone Encounter (Signed)
Patient called stating she was returning a call to the office.   

## 2019-03-08 ENCOUNTER — Ambulatory Visit (INDEPENDENT_AMBULATORY_CARE_PROVIDER_SITE_OTHER): Payer: Medicare Other | Admitting: Specialist

## 2019-04-07 ENCOUNTER — Other Ambulatory Visit: Payer: Self-pay | Admitting: Physician Assistant

## 2019-04-07 NOTE — Telephone Encounter (Signed)
Last Visit: 02/07/19 Next Visit: 02/06/20  Okay to refill Ambien?

## 2019-04-24 DIAGNOSIS — I5022 Chronic systolic (congestive) heart failure: Secondary | ICD-10-CM

## 2019-04-24 HISTORY — DX: Chronic systolic (congestive) heart failure: I50.22

## 2019-04-26 DIAGNOSIS — J9621 Acute and chronic respiratory failure with hypoxia: Secondary | ICD-10-CM | POA: Diagnosis not present

## 2019-04-26 DIAGNOSIS — I480 Paroxysmal atrial fibrillation: Secondary | ICD-10-CM | POA: Diagnosis not present

## 2019-04-26 DIAGNOSIS — J189 Pneumonia, unspecified organism: Secondary | ICD-10-CM | POA: Diagnosis not present

## 2019-04-26 DIAGNOSIS — R6521 Severe sepsis with septic shock: Secondary | ICD-10-CM | POA: Diagnosis not present

## 2019-04-27 DIAGNOSIS — J9621 Acute and chronic respiratory failure with hypoxia: Secondary | ICD-10-CM | POA: Diagnosis not present

## 2019-04-27 DIAGNOSIS — I480 Paroxysmal atrial fibrillation: Secondary | ICD-10-CM | POA: Diagnosis not present

## 2019-04-27 DIAGNOSIS — R6521 Severe sepsis with septic shock: Secondary | ICD-10-CM | POA: Diagnosis not present

## 2019-04-27 DIAGNOSIS — J189 Pneumonia, unspecified organism: Secondary | ICD-10-CM | POA: Diagnosis not present

## 2019-04-28 DIAGNOSIS — R6521 Severe sepsis with septic shock: Secondary | ICD-10-CM | POA: Diagnosis not present

## 2019-04-28 DIAGNOSIS — J9621 Acute and chronic respiratory failure with hypoxia: Secondary | ICD-10-CM | POA: Diagnosis not present

## 2019-04-28 DIAGNOSIS — J189 Pneumonia, unspecified organism: Secondary | ICD-10-CM | POA: Diagnosis not present

## 2019-04-28 DIAGNOSIS — I480 Paroxysmal atrial fibrillation: Secondary | ICD-10-CM | POA: Diagnosis not present

## 2019-04-29 DIAGNOSIS — J189 Pneumonia, unspecified organism: Secondary | ICD-10-CM

## 2019-04-29 DIAGNOSIS — I480 Paroxysmal atrial fibrillation: Secondary | ICD-10-CM | POA: Diagnosis not present

## 2019-04-29 DIAGNOSIS — R6521 Severe sepsis with septic shock: Secondary | ICD-10-CM

## 2019-04-29 DIAGNOSIS — J9621 Acute and chronic respiratory failure with hypoxia: Secondary | ICD-10-CM | POA: Diagnosis not present

## 2019-04-30 DIAGNOSIS — J9621 Acute and chronic respiratory failure with hypoxia: Secondary | ICD-10-CM | POA: Diagnosis not present

## 2019-04-30 DIAGNOSIS — R6521 Severe sepsis with septic shock: Secondary | ICD-10-CM | POA: Diagnosis not present

## 2019-04-30 DIAGNOSIS — J189 Pneumonia, unspecified organism: Secondary | ICD-10-CM | POA: Diagnosis not present

## 2019-04-30 DIAGNOSIS — I480 Paroxysmal atrial fibrillation: Secondary | ICD-10-CM | POA: Diagnosis not present

## 2019-05-01 ENCOUNTER — Other Ambulatory Visit: Payer: Self-pay | Admitting: Physician Assistant

## 2019-05-01 NOTE — Telephone Encounter (Signed)
Last Visit: 02/07/19 Next Visit: 02/06/20  Okay to refill per Dr.Deveshwar

## 2019-05-01 NOTE — Telephone Encounter (Addendum)
Last Visit: 02/07/2019 Next Visit: 02/06/2020  Last fill: 02/07/2019  Okay to refill gabapentin?

## 2019-05-08 DIAGNOSIS — I480 Paroxysmal atrial fibrillation: Secondary | ICD-10-CM | POA: Diagnosis not present

## 2019-05-08 DIAGNOSIS — J9621 Acute and chronic respiratory failure with hypoxia: Secondary | ICD-10-CM | POA: Diagnosis not present

## 2019-05-08 DIAGNOSIS — R6521 Severe sepsis with septic shock: Secondary | ICD-10-CM | POA: Diagnosis not present

## 2019-05-08 DIAGNOSIS — J189 Pneumonia, unspecified organism: Secondary | ICD-10-CM | POA: Diagnosis not present

## 2019-05-09 DIAGNOSIS — I480 Paroxysmal atrial fibrillation: Secondary | ICD-10-CM | POA: Diagnosis not present

## 2019-05-09 DIAGNOSIS — R6521 Severe sepsis with septic shock: Secondary | ICD-10-CM | POA: Diagnosis not present

## 2019-05-09 DIAGNOSIS — J9621 Acute and chronic respiratory failure with hypoxia: Secondary | ICD-10-CM | POA: Diagnosis not present

## 2019-05-09 DIAGNOSIS — J189 Pneumonia, unspecified organism: Secondary | ICD-10-CM | POA: Diagnosis not present

## 2019-05-10 DIAGNOSIS — J189 Pneumonia, unspecified organism: Secondary | ICD-10-CM | POA: Diagnosis not present

## 2019-05-10 DIAGNOSIS — J9621 Acute and chronic respiratory failure with hypoxia: Secondary | ICD-10-CM | POA: Diagnosis not present

## 2019-05-10 DIAGNOSIS — R6521 Severe sepsis with septic shock: Secondary | ICD-10-CM

## 2019-05-10 DIAGNOSIS — I480 Paroxysmal atrial fibrillation: Secondary | ICD-10-CM | POA: Diagnosis not present

## 2019-05-11 DIAGNOSIS — J9621 Acute and chronic respiratory failure with hypoxia: Secondary | ICD-10-CM | POA: Diagnosis not present

## 2019-05-11 DIAGNOSIS — J189 Pneumonia, unspecified organism: Secondary | ICD-10-CM | POA: Diagnosis not present

## 2019-05-11 DIAGNOSIS — R6521 Severe sepsis with septic shock: Secondary | ICD-10-CM | POA: Diagnosis not present

## 2019-05-11 DIAGNOSIS — I480 Paroxysmal atrial fibrillation: Secondary | ICD-10-CM | POA: Diagnosis not present

## 2019-05-12 DIAGNOSIS — J9621 Acute and chronic respiratory failure with hypoxia: Secondary | ICD-10-CM | POA: Diagnosis not present

## 2019-05-12 DIAGNOSIS — J189 Pneumonia, unspecified organism: Secondary | ICD-10-CM | POA: Diagnosis not present

## 2019-05-12 DIAGNOSIS — I480 Paroxysmal atrial fibrillation: Secondary | ICD-10-CM

## 2019-05-12 DIAGNOSIS — R6521 Severe sepsis with septic shock: Secondary | ICD-10-CM | POA: Diagnosis not present

## 2019-05-13 DIAGNOSIS — J9621 Acute and chronic respiratory failure with hypoxia: Secondary | ICD-10-CM | POA: Diagnosis not present

## 2019-05-13 DIAGNOSIS — I480 Paroxysmal atrial fibrillation: Secondary | ICD-10-CM | POA: Diagnosis not present

## 2019-05-13 DIAGNOSIS — R6521 Severe sepsis with septic shock: Secondary | ICD-10-CM | POA: Diagnosis not present

## 2019-05-13 DIAGNOSIS — J189 Pneumonia, unspecified organism: Secondary | ICD-10-CM | POA: Diagnosis not present

## 2019-05-14 DIAGNOSIS — R6521 Severe sepsis with septic shock: Secondary | ICD-10-CM | POA: Diagnosis not present

## 2019-05-14 DIAGNOSIS — J9621 Acute and chronic respiratory failure with hypoxia: Secondary | ICD-10-CM | POA: Diagnosis not present

## 2019-05-14 DIAGNOSIS — J189 Pneumonia, unspecified organism: Secondary | ICD-10-CM | POA: Diagnosis not present

## 2019-05-14 DIAGNOSIS — I480 Paroxysmal atrial fibrillation: Secondary | ICD-10-CM | POA: Diagnosis not present

## 2019-05-22 DIAGNOSIS — I480 Paroxysmal atrial fibrillation: Secondary | ICD-10-CM | POA: Diagnosis not present

## 2019-05-22 DIAGNOSIS — J189 Pneumonia, unspecified organism: Secondary | ICD-10-CM | POA: Diagnosis not present

## 2019-05-22 DIAGNOSIS — J9621 Acute and chronic respiratory failure with hypoxia: Secondary | ICD-10-CM | POA: Diagnosis not present

## 2019-05-22 DIAGNOSIS — R6521 Severe sepsis with septic shock: Secondary | ICD-10-CM | POA: Diagnosis not present

## 2019-05-23 DIAGNOSIS — R6521 Severe sepsis with septic shock: Secondary | ICD-10-CM | POA: Diagnosis not present

## 2019-05-23 DIAGNOSIS — J189 Pneumonia, unspecified organism: Secondary | ICD-10-CM | POA: Diagnosis not present

## 2019-05-23 DIAGNOSIS — J9621 Acute and chronic respiratory failure with hypoxia: Secondary | ICD-10-CM | POA: Diagnosis not present

## 2019-05-23 DIAGNOSIS — I480 Paroxysmal atrial fibrillation: Secondary | ICD-10-CM | POA: Diagnosis not present

## 2019-05-24 DIAGNOSIS — R6521 Severe sepsis with septic shock: Secondary | ICD-10-CM

## 2019-05-24 DIAGNOSIS — J9621 Acute and chronic respiratory failure with hypoxia: Secondary | ICD-10-CM | POA: Diagnosis not present

## 2019-05-24 DIAGNOSIS — J189 Pneumonia, unspecified organism: Secondary | ICD-10-CM | POA: Diagnosis not present

## 2019-05-24 DIAGNOSIS — I480 Paroxysmal atrial fibrillation: Secondary | ICD-10-CM

## 2019-05-25 DIAGNOSIS — J189 Pneumonia, unspecified organism: Secondary | ICD-10-CM

## 2019-05-25 DIAGNOSIS — J9621 Acute and chronic respiratory failure with hypoxia: Secondary | ICD-10-CM

## 2019-05-25 DIAGNOSIS — I480 Paroxysmal atrial fibrillation: Secondary | ICD-10-CM | POA: Diagnosis not present

## 2019-05-25 DIAGNOSIS — R6521 Severe sepsis with septic shock: Secondary | ICD-10-CM | POA: Diagnosis not present

## 2019-05-26 DIAGNOSIS — J9621 Acute and chronic respiratory failure with hypoxia: Secondary | ICD-10-CM

## 2019-05-26 DIAGNOSIS — I480 Paroxysmal atrial fibrillation: Secondary | ICD-10-CM

## 2019-05-26 DIAGNOSIS — R6521 Severe sepsis with septic shock: Secondary | ICD-10-CM

## 2019-05-26 DIAGNOSIS — J189 Pneumonia, unspecified organism: Secondary | ICD-10-CM

## 2019-05-27 DIAGNOSIS — R6521 Severe sepsis with septic shock: Secondary | ICD-10-CM

## 2019-05-27 DIAGNOSIS — J9621 Acute and chronic respiratory failure with hypoxia: Secondary | ICD-10-CM

## 2019-05-27 DIAGNOSIS — I480 Paroxysmal atrial fibrillation: Secondary | ICD-10-CM

## 2019-05-27 DIAGNOSIS — J189 Pneumonia, unspecified organism: Secondary | ICD-10-CM | POA: Diagnosis not present

## 2019-05-28 DIAGNOSIS — J189 Pneumonia, unspecified organism: Secondary | ICD-10-CM | POA: Diagnosis not present

## 2019-05-28 DIAGNOSIS — I480 Paroxysmal atrial fibrillation: Secondary | ICD-10-CM | POA: Diagnosis not present

## 2019-05-28 DIAGNOSIS — R6521 Severe sepsis with septic shock: Secondary | ICD-10-CM | POA: Diagnosis not present

## 2019-05-28 DIAGNOSIS — J9621 Acute and chronic respiratory failure with hypoxia: Secondary | ICD-10-CM | POA: Diagnosis not present

## 2019-05-29 DIAGNOSIS — J9621 Acute and chronic respiratory failure with hypoxia: Secondary | ICD-10-CM | POA: Diagnosis not present

## 2019-05-29 DIAGNOSIS — I480 Paroxysmal atrial fibrillation: Secondary | ICD-10-CM | POA: Diagnosis not present

## 2019-05-29 DIAGNOSIS — R6521 Severe sepsis with septic shock: Secondary | ICD-10-CM | POA: Diagnosis not present

## 2019-05-29 DIAGNOSIS — J189 Pneumonia, unspecified organism: Secondary | ICD-10-CM | POA: Diagnosis not present

## 2019-05-30 DIAGNOSIS — R6521 Severe sepsis with septic shock: Secondary | ICD-10-CM | POA: Diagnosis not present

## 2019-05-30 DIAGNOSIS — I480 Paroxysmal atrial fibrillation: Secondary | ICD-10-CM | POA: Diagnosis not present

## 2019-05-30 DIAGNOSIS — J9621 Acute and chronic respiratory failure with hypoxia: Secondary | ICD-10-CM

## 2019-05-30 DIAGNOSIS — J189 Pneumonia, unspecified organism: Secondary | ICD-10-CM

## 2019-05-31 DIAGNOSIS — J9621 Acute and chronic respiratory failure with hypoxia: Secondary | ICD-10-CM

## 2019-05-31 DIAGNOSIS — J189 Pneumonia, unspecified organism: Secondary | ICD-10-CM

## 2019-05-31 DIAGNOSIS — R6521 Severe sepsis with septic shock: Secondary | ICD-10-CM | POA: Diagnosis not present

## 2019-05-31 DIAGNOSIS — I480 Paroxysmal atrial fibrillation: Secondary | ICD-10-CM

## 2019-06-01 DIAGNOSIS — J9621 Acute and chronic respiratory failure with hypoxia: Secondary | ICD-10-CM

## 2019-06-01 DIAGNOSIS — R6521 Severe sepsis with septic shock: Secondary | ICD-10-CM

## 2019-06-01 DIAGNOSIS — I480 Paroxysmal atrial fibrillation: Secondary | ICD-10-CM

## 2019-06-01 DIAGNOSIS — J189 Pneumonia, unspecified organism: Secondary | ICD-10-CM | POA: Diagnosis not present

## 2019-06-02 DIAGNOSIS — J9621 Acute and chronic respiratory failure with hypoxia: Secondary | ICD-10-CM | POA: Diagnosis not present

## 2019-06-02 DIAGNOSIS — R6521 Severe sepsis with septic shock: Secondary | ICD-10-CM

## 2019-06-02 DIAGNOSIS — J189 Pneumonia, unspecified organism: Secondary | ICD-10-CM | POA: Diagnosis not present

## 2019-06-02 DIAGNOSIS — I480 Paroxysmal atrial fibrillation: Secondary | ICD-10-CM | POA: Diagnosis not present

## 2019-06-03 DIAGNOSIS — J9621 Acute and chronic respiratory failure with hypoxia: Secondary | ICD-10-CM | POA: Diagnosis not present

## 2019-06-03 DIAGNOSIS — I480 Paroxysmal atrial fibrillation: Secondary | ICD-10-CM | POA: Diagnosis not present

## 2019-06-03 DIAGNOSIS — J189 Pneumonia, unspecified organism: Secondary | ICD-10-CM

## 2019-06-03 DIAGNOSIS — R6521 Severe sepsis with septic shock: Secondary | ICD-10-CM | POA: Diagnosis not present

## 2019-06-04 DIAGNOSIS — R6521 Severe sepsis with septic shock: Secondary | ICD-10-CM | POA: Diagnosis not present

## 2019-06-04 DIAGNOSIS — J9621 Acute and chronic respiratory failure with hypoxia: Secondary | ICD-10-CM | POA: Diagnosis not present

## 2019-06-04 DIAGNOSIS — J189 Pneumonia, unspecified organism: Secondary | ICD-10-CM

## 2019-06-04 DIAGNOSIS — I480 Paroxysmal atrial fibrillation: Secondary | ICD-10-CM

## 2019-06-05 DIAGNOSIS — R6521 Severe sepsis with septic shock: Secondary | ICD-10-CM

## 2019-06-05 DIAGNOSIS — J9621 Acute and chronic respiratory failure with hypoxia: Secondary | ICD-10-CM | POA: Diagnosis not present

## 2019-06-05 DIAGNOSIS — I480 Paroxysmal atrial fibrillation: Secondary | ICD-10-CM

## 2019-06-05 DIAGNOSIS — J189 Pneumonia, unspecified organism: Secondary | ICD-10-CM | POA: Diagnosis not present

## 2019-07-31 ENCOUNTER — Other Ambulatory Visit: Payer: Self-pay

## 2019-07-31 ENCOUNTER — Encounter (HOSPITAL_COMMUNITY): Payer: Self-pay | Admitting: Emergency Medicine

## 2019-07-31 ENCOUNTER — Emergency Department (HOSPITAL_COMMUNITY): Payer: 59

## 2019-07-31 ENCOUNTER — Emergency Department (HOSPITAL_COMMUNITY)
Admission: EM | Admit: 2019-07-31 | Discharge: 2019-07-31 | DRG: 871 | Disposition: A | Discharge status: Other Acute Inpatient Hospital | Payer: 59 | Source: Other Acute Inpatient Hospital | Attending: Internal Medicine | Admitting: Internal Medicine

## 2019-07-31 DIAGNOSIS — Z95810 Presence of automatic (implantable) cardiac defibrillator: Secondary | ICD-10-CM | POA: Diagnosis not present

## 2019-07-31 DIAGNOSIS — Z931 Gastrostomy status: Secondary | ICD-10-CM | POA: Diagnosis not present

## 2019-07-31 DIAGNOSIS — I110 Hypertensive heart disease with heart failure: Secondary | ICD-10-CM | POA: Diagnosis not present

## 2019-07-31 DIAGNOSIS — J9610 Chronic respiratory failure, unspecified whether with hypoxia or hypercapnia: Secondary | ICD-10-CM | POA: Diagnosis not present

## 2019-07-31 DIAGNOSIS — K219 Gastro-esophageal reflux disease without esophagitis: Secondary | ICD-10-CM | POA: Diagnosis present

## 2019-07-31 DIAGNOSIS — J9601 Acute respiratory failure with hypoxia: Secondary | ICD-10-CM | POA: Insufficient documentation

## 2019-07-31 DIAGNOSIS — J189 Pneumonia, unspecified organism: Secondary | ICD-10-CM | POA: Diagnosis not present

## 2019-07-31 DIAGNOSIS — M5136 Other intervertebral disc degeneration, lumbar region: Secondary | ICD-10-CM | POA: Diagnosis present

## 2019-07-31 DIAGNOSIS — R9431 Abnormal electrocardiogram [ECG] [EKG]: Secondary | ICD-10-CM | POA: Diagnosis not present

## 2019-07-31 DIAGNOSIS — J9622 Acute and chronic respiratory failure with hypercapnia: Secondary | ICD-10-CM | POA: Diagnosis present

## 2019-07-31 DIAGNOSIS — K644 Residual hemorrhoidal skin tags: Secondary | ICD-10-CM | POA: Diagnosis not present

## 2019-07-31 DIAGNOSIS — Y848 Other medical procedures as the cause of abnormal reaction of the patient, or of later complication, without mention of misadventure at the time of the procedure: Secondary | ICD-10-CM | POA: Diagnosis present

## 2019-07-31 DIAGNOSIS — Z79891 Long term (current) use of opiate analgesic: Secondary | ICD-10-CM

## 2019-07-31 DIAGNOSIS — J9621 Acute and chronic respiratory failure with hypoxia: Secondary | ICD-10-CM | POA: Diagnosis present

## 2019-07-31 DIAGNOSIS — Z8042 Family history of malignant neoplasm of prostate: Secondary | ICD-10-CM

## 2019-07-31 DIAGNOSIS — M47816 Spondylosis without myelopathy or radiculopathy, lumbar region: Secondary | ICD-10-CM | POA: Diagnosis not present

## 2019-07-31 DIAGNOSIS — Z960 Presence of urogenital implants: Secondary | ICD-10-CM | POA: Diagnosis present

## 2019-07-31 DIAGNOSIS — M797 Fibromyalgia: Secondary | ICD-10-CM | POA: Diagnosis not present

## 2019-07-31 DIAGNOSIS — A419 Sepsis, unspecified organism: Secondary | ICD-10-CM | POA: Diagnosis present

## 2019-07-31 DIAGNOSIS — M549 Dorsalgia, unspecified: Secondary | ICD-10-CM | POA: Diagnosis not present

## 2019-07-31 DIAGNOSIS — I4891 Unspecified atrial fibrillation: Secondary | ICD-10-CM | POA: Diagnosis not present

## 2019-07-31 DIAGNOSIS — Y95 Nosocomial condition: Secondary | ICD-10-CM | POA: Diagnosis present

## 2019-07-31 DIAGNOSIS — Z9911 Dependence on respirator [ventilator] status: Secondary | ICD-10-CM

## 2019-07-31 DIAGNOSIS — I5022 Chronic systolic (congestive) heart failure: Secondary | ICD-10-CM | POA: Diagnosis not present

## 2019-07-31 DIAGNOSIS — B9689 Other specified bacterial agents as the cause of diseases classified elsewhere: Secondary | ICD-10-CM

## 2019-07-31 DIAGNOSIS — Z87891 Personal history of nicotine dependence: Secondary | ICD-10-CM

## 2019-07-31 DIAGNOSIS — Z930 Tracheostomy status: Secondary | ICD-10-CM

## 2019-07-31 DIAGNOSIS — Z881 Allergy status to other antibiotic agents status: Secondary | ICD-10-CM | POA: Diagnosis not present

## 2019-07-31 DIAGNOSIS — J95851 Ventilator associated pneumonia: Secondary | ICD-10-CM

## 2019-07-31 DIAGNOSIS — Z96653 Presence of artificial knee joint, bilateral: Secondary | ICD-10-CM | POA: Diagnosis present

## 2019-07-31 DIAGNOSIS — M810 Age-related osteoporosis without current pathological fracture: Secondary | ICD-10-CM | POA: Diagnosis not present

## 2019-07-31 DIAGNOSIS — Z79899 Other long term (current) drug therapy: Secondary | ICD-10-CM

## 2019-07-31 DIAGNOSIS — F329 Major depressive disorder, single episode, unspecified: Secondary | ICD-10-CM | POA: Diagnosis not present

## 2019-07-31 DIAGNOSIS — F419 Anxiety disorder, unspecified: Secondary | ICD-10-CM | POA: Diagnosis present

## 2019-07-31 DIAGNOSIS — Z20828 Contact with and (suspected) exposure to other viral communicable diseases: Secondary | ICD-10-CM | POA: Diagnosis not present

## 2019-07-31 DIAGNOSIS — Z7982 Long term (current) use of aspirin: Secondary | ICD-10-CM

## 2019-07-31 DIAGNOSIS — Z7952 Long term (current) use of systemic steroids: Secondary | ICD-10-CM

## 2019-07-31 DIAGNOSIS — Z888 Allergy status to other drugs, medicaments and biological substances status: Secondary | ICD-10-CM

## 2019-07-31 DIAGNOSIS — M419 Scoliosis, unspecified: Secondary | ICD-10-CM | POA: Diagnosis not present

## 2019-07-31 DIAGNOSIS — Z880 Allergy status to penicillin: Secondary | ICD-10-CM

## 2019-07-31 HISTORY — DX: Tracheostomy status: Z93.0

## 2019-07-31 HISTORY — DX: Gastrostomy status: Z93.1

## 2019-07-31 HISTORY — DX: Dependence on respirator (ventilator) status: Z99.11

## 2019-07-31 LAB — CBC WITH DIFFERENTIAL/PLATELET
Abs Immature Granulocytes: 0.33 10*3/uL — ABNORMAL HIGH (ref 0.00–0.07)
Basophils Absolute: 0.1 10*3/uL (ref 0.0–0.1)
Basophils Relative: 1 %
Eosinophils Absolute: 0.2 10*3/uL (ref 0.0–0.5)
Eosinophils Relative: 1 %
HCT: 38.1 % — ABNORMAL LOW (ref 39.0–52.0)
Hemoglobin: 12.2 g/dL — ABNORMAL LOW (ref 13.0–17.0)
Immature Granulocytes: 2 %
Lymphocytes Relative: 5 %
Lymphs Abs: 1 10*3/uL (ref 0.7–4.0)
MCH: 32.7 pg (ref 26.0–34.0)
MCHC: 32 g/dL (ref 30.0–36.0)
MCV: 102.1 fL — ABNORMAL HIGH (ref 80.0–100.0)
Monocytes Absolute: 2.3 10*3/uL — ABNORMAL HIGH (ref 0.1–1.0)
Monocytes Relative: 12 %
Neutro Abs: 15.3 10*3/uL — ABNORMAL HIGH (ref 1.7–7.7)
Neutrophils Relative %: 79 %
Platelets: 421 10*3/uL — ABNORMAL HIGH (ref 150–400)
RBC: 3.73 MIL/uL — ABNORMAL LOW (ref 4.22–5.81)
RDW: 13.9 % (ref 11.5–15.5)
WBC: 19.2 10*3/uL — ABNORMAL HIGH (ref 4.0–10.5)
nRBC: 0 % (ref 0.0–0.2)

## 2019-07-31 LAB — URINALYSIS, ROUTINE W REFLEX MICROSCOPIC
Bilirubin Urine: NEGATIVE
Glucose, UA: 50 mg/dL — AB
Ketones, ur: NEGATIVE mg/dL
Nitrite: NEGATIVE
Protein, ur: 100 mg/dL — AB
RBC / HPF: >50 RBC/hpf — ABNORMAL HIGH (ref 0–5)
Specific Gravity, Urine: 1.018 (ref 1.005–1.030)
WBC, UA: >50 WBC/hpf — ABNORMAL HIGH (ref 0–5)
pH: 5 (ref 5.0–8.0)

## 2019-07-31 LAB — LACTIC ACID, PLASMA: Lactic Acid, Venous: 0.7 mmol/L (ref 0.5–1.9)

## 2019-07-31 LAB — STREP PNEUMONIAE URINARY ANTIGEN: Strep Pneumo Urinary Antigen: NEGATIVE

## 2019-07-31 LAB — COMPREHENSIVE METABOLIC PANEL
ALT: 20 U/L (ref 0–44)
AST: 18 U/L (ref 15–41)
Albumin: 2.6 g/dL — ABNORMAL LOW (ref 3.5–5.0)
Alkaline Phosphatase: 52 U/L (ref 38–126)
Anion gap: 12 (ref 5–15)
BUN: 18 mg/dL (ref 8–23)
CO2: 31 mmol/L (ref 22–32)
Calcium: 10.1 mg/dL (ref 8.9–10.3)
Chloride: 93 mmol/L — ABNORMAL LOW (ref 98–111)
Creatinine, Ser: 0.69 mg/dL (ref 0.61–1.24)
GFR calc Af Amer: >60 mL/min (ref >60)
GFR calc non Af Amer: >60 mL/min (ref >60)
Glucose, Bld: 158 mg/dL — ABNORMAL HIGH (ref 70–99)
Potassium: 4.9 mmol/L (ref 3.5–5.1)
Sodium: 136 mmol/L (ref 135–145)
Total Bilirubin: 0.4 mg/dL (ref 0.3–1.2)
Total Protein: 6.8 g/dL (ref 6.5–8.1)

## 2019-07-31 LAB — POCT I-STAT 7, (LYTES, BLD GAS, ICA,H+H)
Acid-Base Excess: 9 mmol/L — ABNORMAL HIGH (ref 0.0–2.0)
Bicarbonate: 35.6 mmol/L — ABNORMAL HIGH (ref 20.0–28.0)
Calcium, Ion: 1.42 mmol/L — ABNORMAL HIGH (ref 1.15–1.40)
HCT: 33 % — ABNORMAL LOW (ref 39.0–52.0)
Hemoglobin: 11.2 g/dL — ABNORMAL LOW (ref 13.0–17.0)
O2 Saturation: 86 %
Patient temperature: 99.5
Potassium: 4.5 mmol/L (ref 3.5–5.1)
Sodium: 135 mmol/L (ref 135–145)
TCO2: 37 mmol/L — ABNORMAL HIGH (ref 22–32)
pCO2 arterial: 61.4 mmHg — ABNORMAL HIGH (ref 32.0–48.0)
pH, Arterial: 7.373 (ref 7.350–7.450)
pO2, Arterial: 57 mmHg — ABNORMAL LOW (ref 83.0–108.0)

## 2019-07-31 LAB — MAGNESIUM: Magnesium: 2.1 mg/dL (ref 1.7–2.4)

## 2019-07-31 LAB — PROTIME-INR
INR: 1.1 (ref 0.8–1.2)
Prothrombin Time: 13.7 seconds (ref 11.4–15.2)

## 2019-07-31 LAB — SARS CORONAVIRUS 2 BY RT PCR (HOSPITAL ORDER, PERFORMED IN ~~LOC~~ HOSPITAL LAB): SARS Coronavirus 2: NEGATIVE

## 2019-07-31 LAB — APTT: aPTT: 32 seconds (ref 24–36)

## 2019-07-31 MED ORDER — ASPIRIN 81 MG PO CHEW
81.0000 mg | CHEWABLE_TABLET | Freq: Every day | ORAL | Status: DC
  Administered 2019-07-31: 81 mg
  Filled 2019-07-31: qty 1

## 2019-07-31 MED ORDER — CLONAZEPAM 0.5 MG PO TABS: 1.0000 mg | ORAL_TABLET | Freq: Three times a day (TID) | ORAL | Status: DC

## 2019-07-31 MED ORDER — IPRATROPIUM-ALBUTEROL 0.5-2.5 (3) MG/3ML IN SOLN
3.0000 mL | Freq: Four times a day (QID) | RESPIRATORY_TRACT | Status: DC
  Administered 2019-07-31 (×2): 3 mL via RESPIRATORY_TRACT
  Filled 2019-07-31 (×2): qty 3

## 2019-07-31 MED ORDER — ORAL CARE MOUTH RINSE: 15.0000 mL | OROMUCOSAL | Status: DC

## 2019-07-31 MED ORDER — ALBUTEROL SULFATE (2.5 MG/3ML) 0.083% IN NEBU: 2.5000 mg | INHALATION_SOLUTION | RESPIRATORY_TRACT | Status: DC | PRN

## 2019-07-31 MED ORDER — VANCOMYCIN HCL IN DEXTROSE 1-5 GM/200ML-% IV SOLN
1000.0000 mg | Freq: Once | INTRAVENOUS | Status: DC
  Filled 2019-07-31: qty 200

## 2019-07-31 MED ORDER — CARVEDILOL 12.5 MG PO TABS
12.5000 mg | ORAL_TABLET | Freq: Two times a day (BID) | ORAL | Status: DC
  Administered 2019-07-31: 12.5 mg
  Filled 2019-07-31: qty 1

## 2019-07-31 MED ORDER — SODIUM CHLORIDE 0.9 % IV SOLN
INTRAVENOUS | Status: DC
  Administered 2019-07-31: 11:00:00 via INTRAVENOUS

## 2019-07-31 MED ORDER — SODIUM CHLORIDE 0.9 % IV SOLN
2.0000 g | Freq: Once | INTRAVENOUS | Status: AC
  Administered 2019-07-31: 2 g via INTRAVENOUS
  Filled 2019-07-31: qty 2

## 2019-07-31 MED ORDER — SODIUM CHLORIDE 0.9 % IV SOLN: 2.0000 g | Freq: Three times a day (TID) | INTRAVENOUS | Status: DC

## 2019-07-31 MED ORDER — MORPHINE SULFATE 15 MG PO TABS: 15.0000 mg | ORAL_TABLET | Freq: Three times a day (TID) | ORAL | Status: DC

## 2019-07-31 MED ORDER — ENOXAPARIN SODIUM 40 MG/0.4ML ~~LOC~~ SOLN: 40.0000 mg | SUBCUTANEOUS | Status: DC

## 2019-07-31 MED ORDER — CHLORHEXIDINE GLUCONATE 0.12% ORAL RINSE (MEDLINE KIT): 15.0000 mL | Freq: Two times a day (BID) | OROMUCOSAL | Status: DC

## 2019-07-31 MED ORDER — QUETIAPINE FUMARATE 50 MG PO TABS: 50.0000 mg | ORAL_TABLET | Freq: Two times a day (BID) | ORAL | Status: DC | PRN

## 2019-07-31 MED ORDER — POLYETHYLENE GLYCOL 3350 17 G PO PACK
17.0000 g | PACK | Freq: Every day | ORAL | Status: DC
  Administered 2019-07-31: 11:00:00 17 g
  Filled 2019-07-31: qty 1

## 2019-07-31 MED ORDER — ATORVASTATIN CALCIUM 10 MG PO TABS
20.0000 mg | ORAL_TABLET | Freq: Every day | ORAL | Status: DC
  Administered 2019-07-31: 11:00:00 20 mg
  Filled 2019-07-31: qty 2

## 2019-07-31 MED ORDER — PREDNISONE 20 MG PO TABS
30.0000 mg | ORAL_TABLET | Freq: Every day | ORAL | Status: DC
  Administered 2019-07-31: 11:00:00 30 mg
  Filled 2019-07-31: qty 2

## 2019-07-31 MED ORDER — FAMOTIDINE 40 MG/5ML PO SUSR
20.0000 mg | Freq: Two times a day (BID) | ORAL | Status: DC
  Filled 2019-07-31 (×2): qty 2.5

## 2019-07-31 MED ORDER — VANCOMYCIN HCL 10 G IV SOLR
2000.0000 mg | Freq: Once | INTRAVENOUS | Status: AC
  Administered 2019-07-31: 2000 mg via INTRAVENOUS
  Filled 2019-07-31: qty 2000

## 2019-07-31 MED ORDER — VANCOMYCIN HCL IN DEXTROSE 750-5 MG/150ML-% IV SOLN
750.0000 mg | Freq: Three times a day (TID) | INTRAVENOUS | Status: DC
  Filled 2019-07-31: qty 150

## 2019-07-31 MED ORDER — ESCITALOPRAM OXALATE 10 MG PO TABS
10.0000 mg | ORAL_TABLET | Freq: Every day | ORAL | Status: DC
  Administered 2019-07-31: 10 mg
  Filled 2019-07-31 (×2): qty 1

## 2019-07-31 NOTE — ED Provider Notes (Addendum)
Sign out note   68 year old male vent dependent from [facility] presents to ER with altered mental status in setting of recently diagnosed pneumonia.  Had been started on Vanco and cefepime prior to arrival.  Reportedly transferred due to concern for altered mental status.  Here patient noted to be on baseline vent settings, mentation stable, labs pending, chest x-ray concerning for bilateral basilar pneumonia.  Case discussed with critical care who recommends hospitalist admission.  When labs come back, will consult hospitalist for admission.  7:00 AM received signout from Dr. Roxanne Mins anticipate hospitalist admission after labs result  7:21 AM reviewed labs, chemistry stable, significant leukocytosis, will consult hospitalist  7:30 AM assessed patient, low PO2 on gas, up to 60% FiO2 on vent  7:48 AM discussed with Lorin Mercy who agrees to admit patient  1:10 PM received phone call from Dr. Inda Merlin, PCCM has evaluated patient and they feel he does not need admission  and recommended patient be transported back to [facility] and continue antibiotic therapy at their hospital for HCAP.  Updated nursing staff.    Lucrezia Starch, MD 07/31/19 1311

## 2019-07-31 NOTE — ED Notes (Signed)
Report given to [facility] Therapist, sports. Patient being transferred to [facility] at this time by [facility].

## 2019-07-31 NOTE — ED Triage Notes (Signed)
Patient arrived with EMS from [facility] staff reported altered LOC/lethargy onset 10 pm last night , currently taking antibiotics for pneumonia , ventilator dependent tracheostomy patient .  Somnolent at arrival .

## 2019-07-31 NOTE — Consult Note (Signed)
NAME:  Sergio Mitchell, MRN:  [id number], DOB:  [birth year], LOS: 0 ADMISSION DATE:  07/31/2019, CONSULTATION DATE:  07/31/19 REFERRING MD:  Dr. Lorin Mercy, CHIEF COMPLAINT:  Pneumonia    Brief History   68 y/o M, [facility] Resident, admitted to [facility] on 9/7 with concern for possible bibasilar PNA.    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    The patient was admitted in May to [facility] with an episode of PNA, sepsis, reduced LVEF requiring AICD.  He was transitioned to [facility] after the [facility] admission and subsequently had a trach / PEG placed and has been on mechanical ventilation since. He has been doing CPAP and ATC trials during the day intermittently according to [facility] notes. Reportedly, the patient is able to communicate at baseline with staff at [facility] and they noted a change in mentation on the evening of 9/6.  Additionally, he was started on antibiotics 9/6 for PNA.   ER evaluation notable for ABG 7.37 / 61.4 / 57 / 35.6.  CXR with R>L lower airspace disease concerning for pneumonia.  Labs - Na 136, K 4.9, Cl 93, glucose 158, BUN 18, sr cr 0.69, Mg 2.1, lactic acid 0.7, WBC 19.2, hgb 12.2 and platelets 421. The patient was placed on PRVC with rate of 24.   PCCM consulted for vent management.   Past Medical History  Spondylosis - lumbar region  Right Sacral Fracture - bone density testing -0.4 at femoral neck  Osteoarthritis - followed by Rheumatology, Dr. Estanislado Pandy DDD Scoliosis HTN Cardiac arrhythmia  HFrEF - s/p AICD Seasonal allergies   Tobacco Abuse - former smoker, hospitalized since 03/2019 Cholecystectomy Knee Surgery  Anxiety / [facility] Events   9/07 Admit from [facility] with concern for PNA   Consults:  PCCM   Procedures:  Chronic Trach (7 XLT Proximal) >>  PEG >>   Significant Diagnostic Tests:    Micro Data:  COVID 9/7 >>  negative  BCx2 9/7 >>  Tracheal aspirate 9/7 >>  Urine Strep Antigen 9/7 >>  Urine Legionella 9/7 >>  Antimicrobials:  Vanco 9/7 >>  Azactam 9/7 >>  Cefepime 9/7 >>   Interim history/subjective:  Pt denies SOB.  Indicates "yes" to back pain.  RN reports no acute events.  VSS.   Objective   Blood pressure 130/68, pulse 100, temperature 100.1 F (37.8 C), temperature source Rectal, resp. rate (!) 25, height _0  (1.854 m), weight 110 kg, SpO2 96 %.    Vent Mode: PRVC FiO2 (%):  [55 %-60 %] 60 % Set Rate:  [24 bmp] 24 bmp Vt Set:  [500 mL] 500 mL PEEP:  [5 cmH20-8 cmH20] 8 cmH20 Plateau Pressure:  [15 cmH20] 15 cmH20   Intake/Output Summary (Last 24 hours) at 07/31/2019 1117 Last data filed at 07/31/2019 5208 Gross per 24 hour  Intake 500 ml  Output -  Net 500 ml   Filed Weights   07/31/19 0608  Weight: 110 kg    Examination: General: elderly, chronically ill appearing male lying in bed on vent HEENT: MM pink/moist, #7 proximal XLT trach midline c/d/i Neuro: awake, alert, nods yes/no to questions appears appropriate  CV: s1s2 rrr, no m/r/g PULM: non-labored, lungs bilaterally with rhonchi GI: soft, bsx4 active, PEG in place, bruising to lower abdomen Extremities: warm/dry, no edema  Skin: no rashes or lesions  Resolved  Hospital Problem list      Assessment & Plan:   Bibasilar Airspace Disease / Suspected HAP -R>L on CXR, could have component of atelectasis but suspect infectious process -COVID negative  P: Continue abx as above  Follow cultures to maturity, narrow abx as appropriate Follow fever curve / wbc trend  Intermittent CXR  Pulmonary hygiene as able - chair position, chest PT per bed, mobilize  Assess strep antigen, legionella   Acute on Chronic Hypoxic / Hypercarbic Respiratory Failure Tracheostomy Status  P: PRVC 8cc/kg, rate 24  Wean PEEP / FiO2 for sats > 90% Follow up ABG at 1700  Trach care per protocol   Deconditioning  P: PT consulted  for early mobilization   Best practice:  Diet: TF  Pain/Anxiety/Delirium protocol (if indicated): n/a  VAP protocol (if indicated): in place  DVT prophylaxis: per primary  GI prophylaxis: pepcid  Glucose control: per primary  Mobility: PT efforts / mobilize  Code Status: Full Code  Family Communication: Per primary  Disposition: Per primary   Labs   CBC: Recent Labs  Lab 07/31/19 0615 07/31/19 0644  WBC 19.2*  --   NEUTROABS 15.3*  --   HGB 12.2* 11.2*  HCT 38.1* 33.0*  MCV 102.1*  --   PLT 421*  --     Basic Metabolic Panel: Recent Labs  Lab 07/31/19 0615 07/31/19 0644  NA 136 135  K 4.9 4.5  CL 93*  --   CO2 31  --   GLUCOSE 158*  --   BUN 18  --   CREATININE 0.69  --   CALCIUM 10.1  --   MG 2.1  --    GFR: Estimated Creatinine Clearance: 113.3 mL/min (by C-G formula based on SCr of 0.69 mg/dL). Recent Labs  Lab 07/31/19 0615  WBC 19.2*  LATICACIDVEN 0.7    Liver Function Tests: Recent Labs  Lab 07/31/19 0615  AST 18  ALT 20  ALKPHOS 52  BILITOT 0.4  PROT 6.8  ALBUMIN 2.6*   No results for input(s): LIPASE, AMYLASE in the last 168 hours. No results for input(s): AMMONIA in the last 168 hours.  ABG    Component Value Date/Time   PHART 7.373 07/31/2019 0644   PCO2ART 61.4 (H) 07/31/2019 0644   PO2ART 57.0 (L) 07/31/2019 0644   HCO3 35.6 (H) 07/31/2019 0644   TCO2 37 (H) 07/31/2019 0644   O2SAT 86.0 07/31/2019 0644     Coagulation Profile: Recent Labs  Lab 07/31/19 0615  INR 1.1    Cardiac Enzymes: No results for input(s): CKTOTAL, CKMB, CKMBINDEX, TROPONINI in the last 168 hours.  HbA1C: No results found for: HGBA1C  CBG: No results for input(s): GLUCAP in the last 168 hours.  Review of Systems:   Unable to complete with patient due to mechanical ventilation.   Past Medical History  He,  has a past medical history of DDD (degenerative disc disease), lumbar, Fibromyositis, Osteoporosis, Scoliosis, and Spinal stenosis.    Surgical History    Past Surgical History:  Procedure Laterality Date  . CHOLECYSTECTOMY    . JOINT REPLACEMENT    . REPLACEMENT TOTAL KNEE BILATERAL Bilateral   . TONSILECTOMY, ADENOIDECTOMY, BILATERAL MYRINGOTOMY AND TUBES    . TRACHEOSTOMY       Social History   reports that he has quit smoking. His smoking use included cigarettes. He has a 78.00 pack-year smoking history. He has never used smokeless tobacco. He reports that he does not drink alcohol or use  drugs.   Family History   His family history includes Fibromyalgia in his mother; Prostate cancer in his father; Scoliosis in his mother.   Allergies Allergies  Allergen Reactions  . Bacitracin Other (See Comments)    Per MAR  . Cefdinir Other (See Comments)    Per MAR  . Lidocaine Other (See Comments)    Per MAR  . Neomycin Other (See Comments)    Per MAR  . Neosporin Af [Miconazole] Other (See Comments)    Unknown 9.7.2020 Per [facility] MAR pt is currently using Miconazole Topical Poweder  . Pantoprazole Other (See Comments)    unknown  . Penicillins Other (See Comments)    Per MAR  . Polymyxin B Other (See Comments)    Per MAR  . Pramoxine Other (See Comments)    Per MAR     Home Medications  Prior to Admission medications   Medication Sig Start Date End Date Taking? Authorizing Provider  acetaminophen (TYLENOL) 325 MG tablet Place 650 mg into feeding tube every 6 (six) hours as needed for mild pain, fever or headache.   Yes [provider]  albuterol (PROVENTIL) (2.5 MG/3ML) 0.083% nebulizer solution Take 2.5 mg by nebulization every 2 (two) hours as needed for wheezing or shortness of breath.   Yes [provider]  aspirin 81 MG chewable tablet Place 81 mg into feeding tube daily.   Yes [provider]  atorvastatin (LIPITOR) 20 MG tablet Place 20 mg into feeding tube daily.   Yes [provider]  carvedilol (COREG) 12.5 MG tablet Place 12.5 mg into feeding tube 2 (two)  times daily.  07/23/16  Yes [provider]  ceFEPIme 1 g in sodium chloride 0.9 % 100 mL Inject 1 g into the vein every 8 (eight) hours. For seven days Started on 9.4.2020   Yes [provider]  chlorhexidine gluconate, MEDLINE KIT, (PERIDEX) 0.12 % solution Use as directed 15 mLs in the mouth or throat 3 (three) times daily.   Yes [provider]  clonazePAM (KLONOPIN) 1 MG tablet Place 1 mg into feeding tube every 8 (eight) hours.   Yes [provider]  docusate sodium (COLACE) 100 MG capsule 100 mg daily. Per tube   Yes [provider]  escitalopram (LEXAPRO) 10 MG tablet Place 10 mg into feeding tube daily.   Yes [provider]  furosemide (LASIX) 40 MG tablet Place 40 mg into feeding tube daily.   Yes [provider]  heparin 5000 UNIT/ML injection Inject 5,000 Units into the skin every 8 (eight) hours.   Yes [provider]  ipratropium-albuterol (DUONEB) 0.5-2.5 (3) MG/3ML SOLN Take 3 mLs by nebulization every 6 (six) hours.   Yes [provider]  lansoprazole (PREVACID) 30 MG capsule Place 30 mg into feeding tube daily at 12 noon.   Yes [provider]  miconazole (ANTIFUNGAL) 2 % powder Apply 1 application topically daily.   Yes [provider]  morphine (MSIR) 15 MG tablet Place 15 mg into feeding tube every 8 (eight) hours.   Yes [provider]  Nutritional Supplements (ISOSOURCE 1.5 CAL PO) Give by tube.   Yes [provider]  ondansetron (ZOFRAN) 4 MG tablet Place 4 mg into feeding tube every 8 (eight) hours as needed for nausea or vomiting.   Yes [provider]  polyethylene glycol (MIRALAX) 17 g packet Place 17 g into feeding tube daily.   Yes [provider]  predniSONE (DELTASONE) 10 MG  tablet Place 30 mg into feeding tube daily with breakfast.   Yes [provider]  QUEtiapine (SEROQUEL) 50 MG tablet Place 50 mg into feeding tube every  12 (twelve) hours as needed (increased anxiousness/behavior).   Yes [provider]  vancomycin IVPB Inject 1,000 mg into the vein daily. For seven days Started on 9.5.2020   Yes [provider]     Critical care time: 30 minutes      Noe Gens, NP-C [facility] Pgr: [phone] or if no answer [phone] 07/31/2019, 11:17 AM

## 2019-07-31 NOTE — Consult Note (Signed)
ER Consult Note   Sergio Mitchell BWG:[id number] DOB: [birth year] DOA: 07/31/2019  PCP: [name], MD Consultants:  None Patient coming from: [facility]; Donald Prose: [name], [phone], [phone]  Chief Complaint:  AMS  HPI: Sergio Mitchell is a 68 y.o. male with medical history significant of  Afib; trach/vent dependence presenting with AMS.  The patient is a tragic one who appeared to be in reasonably good health other than fibromyalgia and chronic pain until he was admitted at [facility] on 5/25.  He presented with CP and SOB and eventually was diagnosed with cardiogenic shock with EF 20-25%, recommended for AICD.  However, he also developed septic shock from HCAP with BAL positive for H flu.  He required trach/peg and was discharged to [facility].  He also had encephalopathy with anxiety and was treated by psychiatry.  He was admitted to [facility] on 7/15 and has been tolerating some CPAP and T-bar trials.    He was started recently on antibiotics (Cefepime/Vanc) at [facility] for HCAP but became altered and lethargic.  The patient was able to report that he felt very tired.  I spoke with his sister.  She reports that he has not been making progress at [facility] and they are frustrated with his lack of progress and wondering if he can be sent to an alternative facility.      ED Course:  Vent-dependent.  Diagnosed with PNA, started on Vanc/Cefepime yesterday.  AMS, seems better. Has bibasilar infiltrates.  First gas with low pO2, increased oxygen to 60%.  PCCM consulted and they recommended admission to [facility].  PCCM will consult for vent management.  Review of Systems: As per HPI; otherwise review of systems reviewed and negative.   Ambulatory Status:  nonambulatory  Past Medical History:  Diagnosis Date  . DDD (degenerative disc disease), lumbar   . Fibromyositis   . Osteoporosis   . S/P percutaneous endoscopic gastrostomy (PEG) tube placement ([place])   . Scoliosis   . Spinal stenosis    . Systolic CHF, chronic ([place]) 04/2019   EF 20-25%, ACID recommended  . Tracheostomy tube present ([place])   . Ventilator dependence [facility])     Past Surgical History:  Procedure Laterality Date  . CHOLECYSTECTOMY    . JOINT REPLACEMENT    . REPLACEMENT TOTAL KNEE BILATERAL Bilateral   . TONSILECTOMY, ADENOIDECTOMY, BILATERAL MYRINGOTOMY AND TUBES    . TRACHEOSTOMY      Social History   Socioeconomic History  . Marital status: Married    Spouse name: Not on file  . Number of children: Not on file  . Years of education: Not on file  . Highest education level: Not on file  Occupational History  . Not on file  Social Needs  . Financial resource strain: Not on file  . Food insecurity    Worry: Not on file    Inability: Not on file  . Transportation needs    Medical: Not on file    Non-medical: Not on file  Tobacco Use  . Smoking status: Former Smoker    Packs/day: 1.50    Years: 52.00    Pack years: 78.00    Types: Cigarettes  . Smokeless tobacco: Never Used  Substance and Sexual Activity  . Alcohol use: No  . Drug use: No  . Sexual activity: Not on file  Lifestyle  . Physical activity    Days per week: Not on file    Minutes per session: Not on file  .  Stress: Not on file  Relationships  . Social Herbalist on phone: Not on file    Gets together: Not on file    Attends religious service: Not on file    Active member of club or organization: Not on file    Attends meetings of clubs or organizations: Not on file    Relationship status: Not on file  . Intimate partner violence    Fear of current or ex partner: Not on file    Emotionally abused: Not on file    Physically abused: Not on file    Forced sexual activity: Not on file  Other Topics Concern  . Not on file  Social History Narrative  . Not on file    Allergies  Allergen Reactions  . Bacitracin Other (See Comments)    Per MAR  . Cefdinir Other (See Comments)    Per MAR  . Lidocaine Other  (See Comments)    Per MAR  . Neomycin Other (See Comments)    Per MAR  . Neosporin Af [Miconazole] Other (See Comments)    Unknown 9.7.2020 Per [facility] MAR pt is currently using Miconazole Topical Poweder  . Pantoprazole Other (See Comments)    unknown  . Penicillins Other (See Comments)    Per MAR  . Polymyxin B Other (See Comments)    Per MAR  . Pramoxine Other (See Comments)    Per MAR    Family History  Problem Relation Age of Onset  . Scoliosis Mother   . Fibromyalgia Mother   . Prostate cancer Father     Prior to Admission medications   Medication Sig Start Date End Date Taking? Authorizing Provider  azithromycin (ZITHROMAX) 250 MG tablet Take by mouth daily.    [provider]  carvedilol (COREG) 12.5 MG tablet Take 12.5 mg by mouth 2 (two) times daily with a meal.  07/23/16   [provider]  DULoxetine (CYMBALTA) 30 MG capsule TAKE 1 CAPSULE BY MOUTH EVERY DAY 05/01/19   Bo Merino, MD  gabapentin (NEURONTIN) 300 MG capsule TAKE ONE CAPSULE BY MOUTH 4 TIMES DAILY 05/01/19   Ofilia Neas, PA-C  lisinopril (PRINIVIL,ZESTRIL) 40 MG tablet Take 40 mg by mouth daily.  09/22/16   [provider]  Multiple Vitamins-Minerals (ZINC PO) Take by mouth daily.    [provider]  NIFEdipine (PROCARDIA XL/ADALAT-CC) 60 MG 24 hr tablet Take 60 mg by mouth daily.  04/14/17   [provider]  oxyCODONE-acetaminophen (PERCOCET) 7.5-325 MG tablet Take 1 tablet by mouth every 4 (four) hours as needed for severe pain.    [provider]  zolpidem (AMBIEN) 10 MG tablet TAKE 1 TABLET BY MOUTH AT BEDTIME AS NEEDED FOR SLEEP 04/07/19   Ofilia Neas, PA-C    Physical Exam: Vitals:   07/31/19 1215 07/31/19 1230 07/31/19 1245 07/31/19 1324  BP:      Pulse: 87 87 86 79  Resp: 17 19 (!) 24 (!) 24  Temp:      TempSrc:      SpO2: 95% 96% 95%   Weight:      Height:         . General:  Appears chronically ill, somnolent but able to  awaken to gentle stimulation and respond accordingly  . Eyes:  PERRL, EOMI, normal lids, iris . ENT:  Trach in place, on ventilator . Cardiovascular:  RRR, no m/r/g. No LE edema.  Marland Kitchen Respiratory:   Diffusely coarse  breath sounds.  Mildly increased respiratory effort. . Abdomen:  soft, NT, ND, NABS; PEG tube in place . Skin:  no rash or induration seen on limited exam; stage 1 right heel pressure ulcer . Musculoskeletal:  Atrophic tone BUE/BLE, good ROM, no bony abnormality . Psychiatric:  Flat/somnolent mood and affect . Neurologic:  Unable to perform    Radiological Exams on Admission: Dg Chest Port 1 View  Result Date: 07/31/2019 CLINICAL DATA:  Altered mental status EXAM: PORTABLE CHEST 1 VIEW COMPARISON:  08/15/2010 FINDINGS: Tracheostomy tube in place. Possible cardiomegaly but limited by leftward rotation. Airspace disease at both lung bases. No edema, definite effusion, or pneumothorax. Chronic granulomatous type calcification on the left. IMPRESSION: Pneumonia both lung bases. Electronically Signed   By: Monte Fantasia M.D.   On: 07/31/2019 07:08    EKG: Independently reviewed.  Sinus tachycardia with rate 102; LVH; prolonged QTc 527; nonspecific ST changes   Labs on Admission: I have personally reviewed the available labs and imaging studies at the time of the admission.  Pertinent labs:   ABG: 7.373/61.4/57.0/35.6/86% Glucose 158 Albumin 2.6 Lactate 0.7 WBC 19.2 Hgb 12.2 Platelets 421 INR 1.1 UA: 50 glucose, large Hgb, large LE, negative nitrite, 100 protein, few bacteria, >50 RBC, >50 WBC   Assessment/Plan Principal Problem:   VABP (ventilator-associated bacterial pneumonia) (HCC) Active Problems:   Anxiety   Atrial fibrillation (HCC)   Chronic respiratory failure requiring continuous mechanical ventilation through tracheostomy ([place])   -Tragic situation where previously healthy patient developed cardiogenic and septic shock, ultimately resulting in trach/PEG  dependence -He was discharged to [facility] and family reports is making little progress -He was diagnosed a day or more ago with VAP and started on Cefepime and Vanc -He was more somnolent/altered last night and so was sent to the ER for further evaluation -CXR shows bibasilar PNA -It seems reasonable to continue Vanc/Cefepime -There does not appear to be an acute indication for admission since this complication should be able to be effectively managed at [facility] has consulted and agrees -I have recommended that the patient be transferred back to [facility] for further management in the LTAC setting -I spoke with the family, who is somewhat dismayed at his lack of progress at [facility] -Other home medications can be continued    Note: This patient has been tested and is negative for the novel coronavirus COVID-19.   Thank you for this interesting consult.  Please reconsult [facility] if the patient needs further evaluation and treatment.    Karmen Bongo MD [facility]   How to contact the [facility] Attending or Consulting provider [place] or covering provider during after hours [place], for this patient?  1. Check the care team in [facility] and look for a) attending/consulting [facility] provider listed and b) the [facility] team listed 2. Log into [email or web address] and use [place]'s universal password to access. If you do not have the password, please contact the hospital operator. 3. Locate the [facility] provider you are looking for under [facility] and page to a number that you can be directly reached. 4. If you still have difficulty reaching the provider, please page the [facility] (Director on Call) for the Hospitalists listed on amion for assistance.   07/31/2019, 1:51 PM

## 2019-07-31 NOTE — ED Notes (Signed)
Patient verbalizes understanding of discharge instructions. Opportunity for questioning and answers were provided. Armband removed by staff, pt discharged from ED.  

## 2019-07-31 NOTE — ED Provider Notes (Signed)
[facility] EMERGENCY DEPARTMENT Provider Note   CSN: [id number] Arrival date & time: 07/31/19  0557    History   Chief Complaint Chief Complaint  Patient presents with  . Altered LOC    HPI Sergio Mitchell is a 68 y.o. male.   The history is provided by the EMS personnel. The history is limited by the condition of the patient (Intubated, altered mental status).  He has history of atrial fibrillation, GERD and was transferred from [facility] because of altered mental status.  He is a chronic ventilator patient, but apparently is able to communicate well.  Tonight, he was noted to have altered mentation and was not able to communicate as he had before.  He was started on antibiotics for pneumonia yesterday.  Past Medical History:  Diagnosis Date  . DDD (degenerative disc disease), lumbar   . Fibromyositis   . Osteoporosis   . Scoliosis   . Spinal stenosis     Patient Active Problem List   Diagnosis Date Noted  . Other insomnia 04/20/2017  . Other fatigue 04/20/2017  . Fibromyalgia 10/06/2016  . Primary osteoarthritis of both hands 10/06/2016  . 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 DDDlumbar spine 10/06/2016  . Former smoker 10/06/2016  . Depression 10/06/2016  . Anxiety 10/06/2016  . Atrial fibrillation (Sergio Mitchell) 10/06/2016  . Gastroesophageal reflux disease without esophagitis 10/06/2016    Past Surgical History:  Procedure Laterality Date  . CHOLECYSTECTOMY    . JOINT REPLACEMENT    . REPLACEMENT TOTAL KNEE BILATERAL Bilateral   . TONSILECTOMY, ADENOIDECTOMY, BILATERAL MYRINGOTOMY AND TUBES    . TRACHEOSTOMY          Home Medications    Prior to Admission medications   Medication Sig Start Date End Date Taking? Authorizing Provider  azithromycin (ZITHROMAX) 250 MG tablet Take by mouth daily.    [provider]  carvedilol (COREG) 12.5 MG tablet Take 12.5 mg by mouth 2  (two) times daily with a meal.  07/23/16   [provider]  DULoxetine (CYMBALTA) 30 MG capsule TAKE 1 CAPSULE BY MOUTH EVERY DAY 05/01/19   Bo Merino, MD  gabapentin (NEURONTIN) 300 MG capsule TAKE ONE CAPSULE BY MOUTH 4 TIMES DAILY 05/01/19   Ofilia Neas, PA-C  lisinopril (PRINIVIL,ZESTRIL) 40 MG tablet Take 40 mg by mouth daily.  09/22/16   [provider]  Multiple Vitamins-Minerals (ZINC PO) Take by mouth daily.    [provider]  NIFEdipine (PROCARDIA XL/ADALAT-CC) 60 MG 24 hr tablet Take 60 mg by mouth daily.  04/14/17   [provider]  oxyCODONE-acetaminophen (PERCOCET) 7.5-325 MG tablet Take 1 tablet by mouth every 4 (four) hours as needed for severe pain.    [provider]  zolpidem (AMBIEN) 10 MG tablet TAKE 1 TABLET BY MOUTH AT BEDTIME AS NEEDED FOR SLEEP 04/07/19   Ofilia Neas, PA-C    Family History Family History  Problem Relation Age of Onset  . Scoliosis Mother   . Fibromyalgia Mother   . Prostate cancer Father     Social History Social History   Tobacco Use  . Smoking status: Former Smoker    Packs/day: 1.50    Years: 52.00    Pack years: 78.00    Types: Cigarettes  . Smokeless tobacco: Never Used  Substance Use Topics  . Alcohol use: No  . Drug use: No     Allergies  Cefdinir, Neosporin af [miconazole], Pantoprazole, and Penicillins   Review of Systems Review of Systems  Unable to perform ROS: Mental status change     Physical Exam Updated Vital Signs BP (!) 115/56 (BP Location: Right Arm)   Pulse (!) 103   Temp 100.1 F (37.8 C) (Rectal)   Resp (!) 25   SpO2 95%   Physical Exam Vitals signs and nursing note reviewed.    68 year old male, resting comfortably and in no acute distress. Vital signs are significant for rapid heart rate, rapid respiratory rate, and borderline fever. Oxygen saturation is 95%, which is normal. Head is normocephalic and atraumatic. PERRLA, EOMI. Oropharynx is  clear. Neck is nontender and supple without adenopathy or JVD.  Tracheostomy is in place. Back is nontender and there is no CVA tenderness. Lungs are clear without rales, wheezes, or rhonchi. Chest is nontender. Heart has regular rate and rhythm without murmur. Abdomen is soft, flat, nontender without masses or hepatosplenomegaly and peristalsis is normoactive.  PEG tube present in the left upper quadrant.  Multiple ecchymoses are present over the mid and lower abdomen, apparently from heparin injections. Rectal: External hemorrhoids present. Genitalia: Uncircumcised penis with Foley catheter in place. Extremities have no cyanosis or edema, full range of motion is present. Skin is warm and dry without rash.  Minor skin breakdown present in the sacrum and scrotum (stage I). Neurologic: He is awake and will respond to some questions, cranial nerves are intact, there are no gross motor or sensory deficits.  ED Treatments / Results  Labs (all labs ordered are listed, but only abnormal results are displayed) Labs Reviewed  CBC WITH DIFFERENTIAL/PLATELET - Abnormal; Notable for the following components:      Result Value   WBC 19.2 (*)    RBC 3.73 (*)    Hemoglobin 12.2 (*)    HCT 38.1 (*)    MCV 102.1 (*)    Platelets 421 (*)    Neutro Abs 15.3 (*)    Monocytes Absolute 2.3 (*)    Abs Immature Granulocytes 0.33 (*)    All other components within normal limits  URINALYSIS, ROUTINE W REFLEX MICROSCOPIC - Abnormal; Notable for the following components:   APPearance CLOUDY (*)    Glucose, UA 50 (*)    Hgb urine dipstick LARGE (*)    Protein, ur 100 (*)    Leukocytes,Ua LARGE (*)    RBC / HPF >50 (*)    WBC, UA >50 (*)    Bacteria, UA FEW (*)    All other components within normal limits  POCT I-STAT 7, (LYTES, BLD GAS, ICA,H+H) - Abnormal; Notable for the following components:   pCO2 arterial 61.4 (*)    pO2, Arterial 57.0 (*)    Bicarbonate 35.6 (*)    TCO2 37 (*)    Acid-Base  Excess 9.0 (*)    Calcium, Ion 1.42 (*)    HCT 33.0 (*)    Hemoglobin 11.2 (*)    All other components within normal limits  CULTURE, BLOOD (ROUTINE X 2)  CULTURE, BLOOD (ROUTINE X 2)  URINE CULTURE  SARS CORONAVIRUS 2 (HOSPITAL ORDER, PERFORMED IN [facility])  LACTIC ACID, PLASMA  APTT  PROTIME-INR  LACTIC ACID, PLASMA  COMPREHENSIVE METABOLIC PANEL  MAGNESIUM  I-STAT ARTERIAL BLOOD GAS, ED    EKG EKG Interpretation  Date/Time:  Monday July 31 2019 05:59:42 EDT Ventricular Rate:  102 PR Interval:    QRS Duration: 173 QT Interval:  404  QTC Calculation: 527 R Axis:   146 Text Interpretation:  Sinus tachycardia Left ventricular hypertrophy Prolonged QT interval Rightward axis Baseline wander in lead(s) V6 Partial missing lead(s): V6 When compared with ECG of 08/11/2010, QT has lengthened Axis has shifted rightward Confirmed by Dione BoozeGlick, Jenesis Martin ([id number]) on 07/31/2019 6:17:42 AM   Radiology Dg Chest Port 1 View  Result Date: 07/31/2019 CLINICAL DATA:  Altered mental status EXAM: PORTABLE CHEST 1 VIEW COMPARISON:  08/15/2010 FINDINGS: Tracheostomy tube in place. Possible cardiomegaly but limited by leftward rotation. Airspace disease at both lung bases. No edema, definite effusion, or pneumothorax. Chronic granulomatous type calcification on the left. IMPRESSION: Pneumonia both lung bases. Electronically Signed   By: Marnee SpringJonathon  Watts M.D.   On: 07/31/2019 07:08    Procedures Procedures  CRITICAL CARE Performed by: Dione Boozeavid Birdie Fetty Total critical care time: 60 minutes Critical care time was exclusive of separately billable procedures and treating other patients. Critical care was necessary to treat or prevent imminent or life-threatening deterioration. Critical care was time spent personally by me on the following activities: development of treatment plan with patient and/or surrogate as well as nursing, discussions with consultants, evaluation of patient's response to  treatment, examination of patient, obtaining history from patient or surrogate, ordering and performing treatments and interventions, ordering and review of laboratory studies, ordering and review of radiographic studies, pulse oximetry and re-evaluation of patient's condition.  Medications Ordered in ED Medications  vancomycin (VANCOCIN) IVPB 1000 mg/200 mL premix (has no administration in time range)  aztreonam (AZACTAM) 2 g in sodium chloride 0.9 % 100 mL IVPB (has no administration in time range)     Initial Impression / Assessment and Plan / ED Course  I have reviewed the triage vital signs and the nursing notes.  Pertinent labs & imaging results that were available during my care of the patient were reviewed by me and considered in my medical decision making (see chart for details).  Patient with pneumonia and altered mental status.  Although he technically does not have a fever, temperature 100.1 at this time of day is clearly abnormal.  He will be treated as a code sepsis patient.  [facility] and he was started on vancomycin and cefepime.  Because of presence of antibiotics, cultures will likely be unrevealing.  ECG shows a rightward axis with prolonged QT interval, will check magnesium level.  Chest x-ray shows bibasilar infiltrates.  ABG showed chronic hypercarbia which is appropriately compensated.  Remainder of labs are pending.  I have discussed the case with pulmonary critical care who states that if patient does need to be admitted, they will consult for ventilator management but is to be admitted to hospitalist service.  Urinalysis does have significant number of WBCs and RBCs, but he clinically does not have a UTI.  Case is signed out to Dr. Stevie Kernykstra.  Final Clinical Impressions(s) / ED Diagnoses   Final diagnoses:  HCAP (healthcare-associated pneumonia)  Chronic respiratory failure requiring continuous mechanical ventilation through tracheostomy  [facility](HCC)    ED Discharge Orders    None       Dione BoozeGlick, Kaydance Bowie, MD 07/31/19 [phone]

## 2019-07-31 NOTE — Progress Notes (Addendum)
Pharmacy Antibiotic Note  Sergio Mitchell is a 68 y.o. male admitted on 07/31/2019 with sepsis.  Pharmacy has been consulted for vancomycin and aztreonam dosing.  Pt presents from [place] as chronic vent pt started tx for PNA 1 day prior.  No prior hx of abx, and unable to get hx of cephalosporin and pcn allergy at this time.    Plan: Vancomycin 2g IV, then 750mg  IV every 8 hours (calc AUC 474, SCr 0.8) Aztreonam 2g IV every 8 hours F/u more detail from [place] fax to change to cephalosporin Monitor renal function, Cx and clinical progression to narrow  Height: 6\' 1"  (185.4 cm) Weight: 242 lb 8.1 oz (110 kg) IBW/kg (Calculated) : 79.9  Temp (24hrs), Avg:100.1 F (37.8 C), Min:100.1 F (37.8 C), Max:100.1 F (37.8 C)  Recent Labs  Lab 07/31/19 0615  WBC 19.2*  CREATININE 0.69  LATICACIDVEN 0.7    Estimated Creatinine Clearance: 113.3 mL/min (by C-G formula based on SCr of 0.69 mg/dL).    Allergies  Allergen Reactions  . Cefdinir   . Neosporin Af [Miconazole]   . Pantoprazole   . Penicillins     Addendum: F/u Med hx reveals Cefepime started at [facility], will switch from aztreonam to Cefepime given apparent tolerance  Bertis Ruddy, PharmD Clinical Pharmacist Please check AMION for all [facility] numbers 07/31/2019 7:53 AM

## 2019-07-31 NOTE — Progress Notes (Addendum)
Patient arrived via EMS from [facility].  Patient was being ventilated via bag-mask-valve.  Placed on mechanical ventilation settings from previous facility.   PRVC  500-24-55%-+5.  #7.0 XLT proximal trach is secured.  Moderate thick, tan, pink tinged secretions obtained.   Per Progress Notes from [facility]. Patient was tolerating CPAP/PS & ATC trials for a brief period of time.

## 2019-07-31 NOTE — Discharge Instructions (Signed)
Patient will need to continue vancomycin and cefepime for healthcare acquired pneumonia.  If patient has worsening blood pressure, elevated heart rate, change in mental status or other new concerning symptoms recommend returning to ER for reassessment.

## 2019-08-01 LAB — URINE CULTURE: Culture: >=100000 — AB

## 2019-08-02 LAB — LEGIONELLA PNEUMOPHILA SEROGP 1 UR AG: L. pneumophila Serogp 1 Ur Ag: NEGATIVE

## 2019-08-03 LAB — CULTURE, RESPIRATORY W GRAM STAIN

## 2019-08-05 LAB — CULTURE, BLOOD (ROUTINE X 2)
Culture: NO GROWTH
Culture: NO GROWTH
Special Requests: ADEQUATE
Special Requests: ADEQUATE

## 2019-09-02 ENCOUNTER — Inpatient Hospital Stay (HOSPITAL_COMMUNITY)
Admission: EM | Admit: 2019-09-02 | Discharge: 2019-09-16 | DRG: 207 | Disposition: A | Discharge status: Other Acute Inpatient Hospital | Payer: Medicare Other | Source: Skilled Nursing Facility | Attending: Internal Medicine | Admitting: Internal Medicine

## 2019-09-02 ENCOUNTER — Other Ambulatory Visit: Payer: Self-pay

## 2019-09-02 ENCOUNTER — Encounter (HOSPITAL_COMMUNITY): Payer: Self-pay

## 2019-09-02 ENCOUNTER — Emergency Department (HOSPITAL_COMMUNITY): Payer: Medicare Other

## 2019-09-02 DIAGNOSIS — U071 COVID-19: Principal | ICD-10-CM | POA: Diagnosis present

## 2019-09-02 DIAGNOSIS — M810 Age-related osteoporosis without current pathological fracture: Secondary | ICD-10-CM | POA: Diagnosis present

## 2019-09-02 DIAGNOSIS — M797 Fibromyalgia: Secondary | ICD-10-CM | POA: Diagnosis present

## 2019-09-02 DIAGNOSIS — E785 Hyperlipidemia, unspecified: Secondary | ICD-10-CM | POA: Diagnosis present

## 2019-09-02 DIAGNOSIS — G9341 Metabolic encephalopathy: Secondary | ICD-10-CM | POA: Diagnosis not present

## 2019-09-02 DIAGNOSIS — Z883 Allergy status to other anti-infective agents status: Secondary | ICD-10-CM

## 2019-09-02 DIAGNOSIS — F419 Anxiety disorder, unspecified: Secondary | ICD-10-CM | POA: Diagnosis present

## 2019-09-02 DIAGNOSIS — R739 Hyperglycemia, unspecified: Secondary | ICD-10-CM | POA: Diagnosis not present

## 2019-09-02 DIAGNOSIS — M4800 Spinal stenosis, site unspecified: Secondary | ICD-10-CM | POA: Diagnosis present

## 2019-09-02 DIAGNOSIS — I4891 Unspecified atrial fibrillation: Secondary | ICD-10-CM | POA: Diagnosis not present

## 2019-09-02 DIAGNOSIS — L89151 Pressure ulcer of sacral region, stage 1: Secondary | ICD-10-CM | POA: Diagnosis not present

## 2019-09-02 DIAGNOSIS — F329 Major depressive disorder, single episode, unspecified: Secondary | ICD-10-CM | POA: Diagnosis present

## 2019-09-02 DIAGNOSIS — A413 Sepsis due to Hemophilus influenzae: Secondary | ICD-10-CM | POA: Diagnosis not present

## 2019-09-02 DIAGNOSIS — R6521 Severe sepsis with septic shock: Secondary | ICD-10-CM | POA: Diagnosis not present

## 2019-09-02 DIAGNOSIS — Z96653 Presence of artificial knee joint, bilateral: Secondary | ICD-10-CM | POA: Diagnosis present

## 2019-09-02 DIAGNOSIS — Z79899 Other long term (current) drug therapy: Secondary | ICD-10-CM

## 2019-09-02 DIAGNOSIS — L8990 Pressure ulcer of unspecified site, unspecified stage: Secondary | ICD-10-CM | POA: Diagnosis present

## 2019-09-02 DIAGNOSIS — J069 Acute upper respiratory infection, unspecified: Secondary | ICD-10-CM | POA: Diagnosis not present

## 2019-09-02 DIAGNOSIS — Z66 Do not resuscitate: Secondary | ICD-10-CM | POA: Diagnosis not present

## 2019-09-02 DIAGNOSIS — Z9911 Dependence on respirator [ventilator] status: Secondary | ICD-10-CM | POA: Diagnosis not present

## 2019-09-02 DIAGNOSIS — J9621 Acute and chronic respiratory failure with hypoxia: Secondary | ICD-10-CM | POA: Diagnosis present

## 2019-09-02 DIAGNOSIS — Z820 Family history of epilepsy and other diseases of the nervous system: Secondary | ICD-10-CM

## 2019-09-02 DIAGNOSIS — Z7952 Long term (current) use of systemic steroids: Secondary | ICD-10-CM

## 2019-09-02 DIAGNOSIS — J988 Other specified respiratory disorders: Secondary | ICD-10-CM | POA: Diagnosis not present

## 2019-09-02 DIAGNOSIS — Z7982 Long term (current) use of aspirin: Secondary | ICD-10-CM

## 2019-09-02 DIAGNOSIS — I480 Paroxysmal atrial fibrillation: Secondary | ICD-10-CM | POA: Diagnosis present

## 2019-09-02 DIAGNOSIS — Z79891 Long term (current) use of opiate analgesic: Secondary | ICD-10-CM

## 2019-09-02 DIAGNOSIS — M5136 Other intervertebral disc degeneration, lumbar region: Secondary | ICD-10-CM | POA: Diagnosis present

## 2019-09-02 DIAGNOSIS — Z930 Tracheostomy status: Secondary | ICD-10-CM | POA: Diagnosis not present

## 2019-09-02 DIAGNOSIS — Z9109 Other allergy status, other than to drugs and biological substances: Secondary | ICD-10-CM

## 2019-09-02 DIAGNOSIS — J9601 Acute respiratory failure with hypoxia: Secondary | ICD-10-CM | POA: Diagnosis present

## 2019-09-02 DIAGNOSIS — E876 Hypokalemia: Secondary | ICD-10-CM | POA: Diagnosis present

## 2019-09-02 DIAGNOSIS — J1289 Other viral pneumonia: Secondary | ICD-10-CM | POA: Diagnosis present

## 2019-09-02 DIAGNOSIS — J9690 Respiratory failure, unspecified, unspecified whether with hypoxia or hypercapnia: Secondary | ICD-10-CM

## 2019-09-02 DIAGNOSIS — Z87891 Personal history of nicotine dependence: Secondary | ICD-10-CM

## 2019-09-02 DIAGNOSIS — M542 Cervicalgia: Secondary | ICD-10-CM | POA: Diagnosis not present

## 2019-09-02 DIAGNOSIS — J398 Other specified diseases of upper respiratory tract: Secondary | ICD-10-CM | POA: Diagnosis not present

## 2019-09-02 DIAGNOSIS — J984 Other disorders of lung: Secondary | ICD-10-CM | POA: Diagnosis present

## 2019-09-02 DIAGNOSIS — I5022 Chronic systolic (congestive) heart failure: Secondary | ICD-10-CM | POA: Diagnosis present

## 2019-09-02 DIAGNOSIS — J9622 Acute and chronic respiratory failure with hypercapnia: Secondary | ICD-10-CM | POA: Diagnosis present

## 2019-09-02 DIAGNOSIS — I5023 Acute on chronic systolic (congestive) heart failure: Secondary | ICD-10-CM | POA: Diagnosis not present

## 2019-09-02 DIAGNOSIS — Z8042 Family history of malignant neoplasm of prostate: Secondary | ICD-10-CM

## 2019-09-02 DIAGNOSIS — J9811 Atelectasis: Secondary | ICD-10-CM | POA: Diagnosis present

## 2019-09-02 DIAGNOSIS — Z880 Allergy status to penicillin: Secondary | ICD-10-CM

## 2019-09-02 DIAGNOSIS — J9312 Secondary spontaneous pneumothorax: Secondary | ICD-10-CM | POA: Diagnosis not present

## 2019-09-02 DIAGNOSIS — G8929 Other chronic pain: Secondary | ICD-10-CM | POA: Diagnosis present

## 2019-09-02 DIAGNOSIS — M419 Scoliosis, unspecified: Secondary | ICD-10-CM | POA: Diagnosis present

## 2019-09-02 DIAGNOSIS — J1282 Pneumonia due to coronavirus disease 2019: Secondary | ICD-10-CM

## 2019-09-02 DIAGNOSIS — Z888 Allergy status to other drugs, medicaments and biological substances status: Secondary | ICD-10-CM

## 2019-09-02 DIAGNOSIS — M549 Dorsalgia, unspecified: Secondary | ICD-10-CM | POA: Diagnosis not present

## 2019-09-02 DIAGNOSIS — L8941 Pressure ulcer of contiguous site of back, buttock and hip, stage 1: Secondary | ICD-10-CM | POA: Diagnosis not present

## 2019-09-02 DIAGNOSIS — L89616 Pressure-induced deep tissue damage of right heel: Secondary | ICD-10-CM | POA: Diagnosis not present

## 2019-09-02 DIAGNOSIS — I482 Chronic atrial fibrillation, unspecified: Secondary | ICD-10-CM | POA: Diagnosis not present

## 2019-09-02 DIAGNOSIS — I110 Hypertensive heart disease with heart failure: Secondary | ICD-10-CM | POA: Diagnosis present

## 2019-09-02 DIAGNOSIS — T380X5A Adverse effect of glucocorticoids and synthetic analogues, initial encounter: Secondary | ICD-10-CM | POA: Diagnosis not present

## 2019-09-02 LAB — CBC WITH DIFFERENTIAL/PLATELET
Abs Immature Granulocytes: 0.15 10*3/uL — ABNORMAL HIGH (ref 0.00–0.07)
Basophils Absolute: 0.1 10*3/uL (ref 0.0–0.1)
Basophils Relative: 0 %
Eosinophils Absolute: 0.2 10*3/uL (ref 0.0–0.5)
Eosinophils Relative: 1 %
HCT: 34.9 % — ABNORMAL LOW (ref 39.0–52.0)
Hemoglobin: 11.3 g/dL — ABNORMAL LOW (ref 13.0–17.0)
Immature Granulocytes: 1 %
Lymphocytes Relative: 12 %
Lymphs Abs: 1.9 10*3/uL (ref 0.7–4.0)
MCH: 32.4 pg (ref 26.0–34.0)
MCHC: 32.4 g/dL (ref 30.0–36.0)
MCV: 100 fL (ref 80.0–100.0)
Monocytes Absolute: 1.3 10*3/uL — ABNORMAL HIGH (ref 0.1–1.0)
Monocytes Relative: 8 %
Neutro Abs: 12.4 10*3/uL — ABNORMAL HIGH (ref 1.7–7.7)
Neutrophils Relative %: 78 %
Platelets: 362 10*3/uL (ref 150–400)
RBC: 3.49 MIL/uL — ABNORMAL LOW (ref 4.22–5.81)
RDW: 13.9 % (ref 11.5–15.5)
WBC: 16 10*3/uL — ABNORMAL HIGH (ref 4.0–10.5)
nRBC: 0 % (ref 0.0–0.2)

## 2019-09-02 LAB — POCT I-STAT 7, (LYTES, BLD GAS, ICA,H+H)
Acid-Base Excess: 13 mmol/L — ABNORMAL HIGH (ref 0.0–2.0)
Bicarbonate: 38.3 mmol/L — ABNORMAL HIGH (ref 20.0–28.0)
Calcium, Ion: 1.36 mmol/L (ref 1.15–1.40)
HCT: 35 % — ABNORMAL LOW (ref 39.0–52.0)
Hemoglobin: 11.9 g/dL — ABNORMAL LOW (ref 13.0–17.0)
O2 Saturation: 99 %
Patient temperature: 100.4
Potassium: 3.8 mmol/L (ref 3.5–5.1)
Sodium: 138 mmol/L (ref 135–145)
TCO2: 40 mmol/L — ABNORMAL HIGH (ref 22–32)
pCO2 arterial: 51.2 mmHg — ABNORMAL HIGH (ref 32.0–48.0)
pH, Arterial: 7.485 — ABNORMAL HIGH (ref 7.350–7.450)
pO2, Arterial: 152 mmHg — ABNORMAL HIGH (ref 83.0–108.0)

## 2019-09-02 LAB — URINALYSIS, ROUTINE W REFLEX MICROSCOPIC
Bilirubin Urine: NEGATIVE
Glucose, UA: NEGATIVE mg/dL
Hgb urine dipstick: NEGATIVE
Ketones, ur: NEGATIVE mg/dL
Leukocytes,Ua: NEGATIVE
Nitrite: NEGATIVE
Protein, ur: NEGATIVE mg/dL

## 2019-09-02 LAB — COMPREHENSIVE METABOLIC PANEL
ALT: 25 U/L (ref 0–44)
AST: 23 U/L (ref 15–41)
Albumin: 3.2 g/dL — ABNORMAL LOW (ref 3.5–5.0)
Alkaline Phosphatase: 43 U/L (ref 38–126)
Anion gap: 12 (ref 5–15)
BUN: 27 mg/dL — ABNORMAL HIGH (ref 8–23)
CO2: 33 mmol/L — ABNORMAL HIGH (ref 22–32)
Calcium: 10.2 mg/dL (ref 8.9–10.3)
Chloride: 95 mmol/L — ABNORMAL LOW (ref 98–111)
Creatinine, Ser: 0.77 mg/dL (ref 0.61–1.24)
GFR calc Af Amer: >60 mL/min (ref >60)
GFR calc non Af Amer: >60 mL/min (ref >60)
Glucose, Bld: 111 mg/dL — ABNORMAL HIGH (ref 70–99)
Potassium: 3.7 mmol/L (ref 3.5–5.1)
Sodium: 140 mmol/L (ref 135–145)
Total Bilirubin: 0.3 mg/dL (ref 0.3–1.2)
Total Protein: 6.9 g/dL (ref 6.5–8.1)

## 2019-09-02 LAB — URINALYSIS, MICROSCOPIC (REFLEX): RBC / HPF: >50 RBC/hpf (ref 0–5)

## 2019-09-02 LAB — FIBRINOGEN: Fibrinogen: 595 mg/dL — ABNORMAL HIGH (ref 210–475)

## 2019-09-02 LAB — LACTIC ACID, PLASMA: Lactic Acid, Venous: 1 mmol/L (ref 0.5–1.9)

## 2019-09-02 LAB — C-REACTIVE PROTEIN: CRP: <0.8 mg/dL (ref <1.0)

## 2019-09-02 LAB — GLUCOSE, CAPILLARY: Glucose-Capillary: 109 mg/dL — ABNORMAL HIGH (ref 70–99)

## 2019-09-02 LAB — ABO/RH: ABO/RH(D): A POS

## 2019-09-02 LAB — PROCALCITONIN: Procalcitonin: <0.1 ng/mL

## 2019-09-02 LAB — HIV ANTIBODY (ROUTINE TESTING W REFLEX): HIV Screen 4th Generation wRfx: NONREACTIVE

## 2019-09-02 LAB — D-DIMER, QUANTITATIVE: D-Dimer, Quant: 1.84 ug/mL-FEU — ABNORMAL HIGH (ref 0.00–0.50)

## 2019-09-02 MED ORDER — DOCUSATE SODIUM 50 MG/5ML PO LIQD
100.0000 mg | Freq: Every day | ORAL | Status: DC
  Administered 2019-09-03 – 2019-09-14 (×12): 100 mg
  Filled 2019-09-02 (×12): qty 10

## 2019-09-02 MED ORDER — METHYLPREDNISOLONE SODIUM SUCC 125 MG IJ SOLR
55.0000 mg | Freq: Two times a day (BID) | INTRAMUSCULAR | Status: DC
  Administered 2019-09-02 – 2019-09-12 (×19): 55 mg via INTRAVENOUS
  Filled 2019-09-02 (×20): qty 2

## 2019-09-02 MED ORDER — ATORVASTATIN CALCIUM 10 MG PO TABS
20.0000 mg | ORAL_TABLET | Freq: Every day | ORAL | Status: DC
  Administered 2019-09-02 – 2019-09-14 (×13): 20 mg
  Filled 2019-09-02 (×14): qty 2

## 2019-09-02 MED ORDER — SODIUM CHLORIDE 0.9 % IV SOLN
100.0000 mg | INTRAVENOUS | Status: AC
  Administered 2019-09-03 – 2019-09-06 (×4): 100 mg via INTRAVENOUS
  Filled 2019-09-02 (×4): qty 20

## 2019-09-02 MED ORDER — POLYETHYLENE GLYCOL 3350 17 G PO PACK
17.0000 g | PACK | Freq: Every day | ORAL | Status: DC
  Administered 2019-09-02 – 2019-09-13 (×7): 17 g
  Filled 2019-09-02 (×8): qty 1

## 2019-09-02 MED ORDER — ENOXAPARIN SODIUM 60 MG/0.6ML ~~LOC~~ SOLN
55.0000 mg | Freq: Two times a day (BID) | SUBCUTANEOUS | Status: DC
  Administered 2019-09-02: 55 mg via SUBCUTANEOUS
  Filled 2019-09-02: qty 0.6

## 2019-09-02 MED ORDER — VITAL HIGH PROTEIN PO LIQD: 1000.0000 mL | ORAL | Status: DC

## 2019-09-02 MED ORDER — LANSOPRAZOLE 15 MG PO TBDD
30.0000 mg | DELAYED_RELEASE_TABLET | Freq: Two times a day (BID) | ORAL | Status: DC
  Filled 2019-09-02 (×3): qty 2

## 2019-09-02 MED ORDER — DOCUSATE SODIUM 100 MG PO CAPS
100.0000 mg | ORAL_CAPSULE | Freq: Every day | ORAL | Status: DC
  Administered 2019-09-02: 100 mg via ORAL
  Filled 2019-09-02: qty 1

## 2019-09-02 MED ORDER — IPRATROPIUM-ALBUTEROL 20-100 MCG/ACT IN AERS
1.0000 | INHALATION_SPRAY | Freq: Four times a day (QID) | RESPIRATORY_TRACT | Status: DC
  Filled 2019-09-02: qty 4

## 2019-09-02 MED ORDER — ESCITALOPRAM OXALATE 10 MG PO TABS
10.0000 mg | ORAL_TABLET | Freq: Every day | ORAL | Status: DC
  Administered 2019-09-02 – 2019-09-14 (×13): 10 mg
  Filled 2019-09-02 (×15): qty 1

## 2019-09-02 MED ORDER — LANSOPRAZOLE 30 MG PO CPDR
30.0000 mg | DELAYED_RELEASE_CAPSULE | Freq: Two times a day (BID) | ORAL | Status: DC
  Filled 2019-09-02 (×2): qty 1

## 2019-09-02 MED ORDER — IPRATROPIUM-ALBUTEROL 0.5-2.5 (3) MG/3ML IN SOLN
3.0000 mL | Freq: Four times a day (QID) | RESPIRATORY_TRACT | Status: DC
  Administered 2019-09-02 – 2019-09-05 (×10): 3 mL via RESPIRATORY_TRACT
  Filled 2019-09-02 (×10): qty 3

## 2019-09-02 MED ORDER — SODIUM CHLORIDE 0.9% FLUSH
3.0000 mL | Freq: Two times a day (BID) | INTRAVENOUS | Status: DC
  Administered 2019-09-02 – 2019-09-15 (×19): 3 mL via INTRAVENOUS

## 2019-09-02 MED ORDER — ONDANSETRON HCL 4 MG PO TABS: 4.0000 mg | ORAL_TABLET | Freq: Four times a day (QID) | ORAL | Status: DC | PRN

## 2019-09-02 MED ORDER — MORPHINE SULFATE 15 MG PO TABS
15.0000 mg | ORAL_TABLET | Freq: Three times a day (TID) | ORAL | Status: DC
  Administered 2019-09-02 – 2019-09-15 (×39): 15 mg
  Filled 2019-09-02 (×39): qty 1

## 2019-09-02 MED ORDER — ACETAMINOPHEN 325 MG PO TABS: 650.0000 mg | ORAL_TABLET | Freq: Four times a day (QID) | ORAL | Status: DC | PRN

## 2019-09-02 MED ORDER — SODIUM CHLORIDE 0.9% FLUSH: 3.0000 mL | Freq: Two times a day (BID) | INTRAVENOUS | Status: DC

## 2019-09-02 MED ORDER — PANTOPRAZOLE SODIUM 40 MG PO TBEC: 40.0000 mg | DELAYED_RELEASE_TABLET | Freq: Every day | ORAL | Status: DC

## 2019-09-02 MED ORDER — CARVEDILOL 12.5 MG PO TABS
12.5000 mg | ORAL_TABLET | Freq: Two times a day (BID) | ORAL | Status: DC
  Administered 2019-09-02 – 2019-09-14 (×24): 12.5 mg
  Filled 2019-09-02 (×29): qty 1

## 2019-09-02 MED ORDER — ZOLPIDEM TARTRATE 5 MG PO TABS
5.0000 mg | ORAL_TABLET | Freq: Every evening | ORAL | Status: DC | PRN
  Administered 2019-09-02 – 2019-09-11 (×7): 5 mg via ORAL
  Filled 2019-09-02 (×7): qty 1

## 2019-09-02 MED ORDER — SODIUM CHLORIDE 0.9% FLUSH: 3.0000 mL | INTRAVENOUS | Status: DC | PRN

## 2019-09-02 MED ORDER — QUETIAPINE FUMARATE 50 MG PO TABS
50.0000 mg | ORAL_TABLET | Freq: Two times a day (BID) | ORAL | Status: DC | PRN
  Administered 2019-09-03 – 2019-09-14 (×7): 50 mg
  Filled 2019-09-02 (×8): qty 1

## 2019-09-02 MED ORDER — SODIUM CHLORIDE 0.9% FLUSH
3.0000 mL | Freq: Two times a day (BID) | INTRAVENOUS | Status: DC
  Administered 2019-09-02 – 2019-09-04 (×5): 3 mL via INTRAVENOUS
  Administered 2019-09-05: 10 mL via INTRAVENOUS
  Administered 2019-09-05: 3 mL via INTRAVENOUS
  Administered 2019-09-06: 10:00:00 10 mL via INTRAVENOUS
  Administered 2019-09-07 – 2019-09-13 (×12): 3 mL via INTRAVENOUS

## 2019-09-02 MED ORDER — PRO-STAT SUGAR FREE PO LIQD: 30.0000 mL | Freq: Two times a day (BID) | ORAL | Status: DC

## 2019-09-02 MED ORDER — ENOXAPARIN SODIUM 40 MG/0.4ML ~~LOC~~ SOLN: 40.0000 mg | SUBCUTANEOUS | Status: DC

## 2019-09-02 MED ORDER — PRO-STAT SUGAR FREE PO LIQD
30.0000 mL | Freq: Two times a day (BID) | ORAL | Status: DC
  Administered 2019-09-02: 30 mL
  Filled 2019-09-02 (×2): qty 30

## 2019-09-02 MED ORDER — BISACODYL 5 MG PO TBEC: 5.0000 mg | DELAYED_RELEASE_TABLET | Freq: Every day | ORAL | Status: DC | PRN

## 2019-09-02 MED ORDER — ENOXAPARIN SODIUM 40 MG/0.4ML ~~LOC~~ SOLN
40.0000 mg | SUBCUTANEOUS | Status: DC
  Administered 2019-09-03 – 2019-09-15 (×13): 40 mg via SUBCUTANEOUS
  Filled 2019-09-02 (×13): qty 0.4

## 2019-09-02 MED ORDER — SODIUM CHLORIDE 0.9 % IV SOLN
250.0000 mL | INTRAVENOUS | Status: DC | PRN
  Administered 2019-09-07 – 2019-09-09 (×3): 250 mL via INTRAVENOUS

## 2019-09-02 MED ORDER — ONDANSETRON HCL 4 MG/2ML IJ SOLN: 4.0000 mg | Freq: Four times a day (QID) | INTRAMUSCULAR | Status: DC | PRN

## 2019-09-02 MED ORDER — SODIUM CHLORIDE 0.9 % IV SOLN
200.0000 mg | Freq: Once | INTRAVENOUS | Status: AC
  Administered 2019-09-02: 200 mg via INTRAVENOUS
  Filled 2019-09-02: qty 40

## 2019-09-02 MED ORDER — OXYCODONE HCL 5 MG PO TABS
5.0000 mg | ORAL_TABLET | ORAL | Status: DC | PRN
  Administered 2019-09-02 – 2019-09-07 (×5): 5 mg via ORAL
  Filled 2019-09-02 (×5): qty 1

## 2019-09-02 MED ORDER — CLONAZEPAM 1 MG PO TABS
1.0000 mg | ORAL_TABLET | Freq: Three times a day (TID) | ORAL | Status: DC
  Administered 2019-09-02 – 2019-09-15 (×39): 1 mg
  Filled 2019-09-02 (×34): qty 1
  Filled 2019-09-02: qty 2
  Filled 2019-09-02 (×4): qty 1

## 2019-09-02 MED ORDER — ALBUTEROL SULFATE HFA 108 (90 BASE) MCG/ACT IN AERS
2.0000 | INHALATION_SPRAY | RESPIRATORY_TRACT | Status: DC | PRN
  Filled 2019-09-02: qty 6.7

## 2019-09-02 MED ORDER — SODIUM CHLORIDE 0.9% IV SOLUTION: Freq: Once | INTRAVENOUS | Status: DC

## 2019-09-02 MED ORDER — ASPIRIN 81 MG PO CHEW
81.0000 mg | CHEWABLE_TABLET | Freq: Every day | ORAL | Status: DC
  Administered 2019-09-02 – 2019-09-14 (×13): 81 mg
  Filled 2019-09-02 (×14): qty 1

## 2019-09-02 MED ORDER — CHLORHEXIDINE GLUCONATE CLOTH 2 % EX PADS
6.0000 | MEDICATED_PAD | Freq: Every day | CUTANEOUS | Status: DC
  Administered 2019-09-02 – 2019-09-14 (×11): 6 via TOPICAL

## 2019-09-02 MED ORDER — LANSOPRAZOLE 15 MG PO CPDR
30.0000 mg | DELAYED_RELEASE_CAPSULE | Freq: Two times a day (BID) | ORAL | Status: DC
  Administered 2019-09-02 – 2019-09-04 (×4): 30 mg
  Filled 2019-09-02: qty 2
  Filled 2019-09-02: qty 1
  Filled 2019-09-02 (×4): qty 2
  Filled 2019-09-02: qty 1

## 2019-09-02 NOTE — Research (Signed)
[place] PACT Informed Consent   Subject Name: Sergio Mitchell  Subject met inclusion and exclusion criteria.  The informed consent form, study requirements and expectations were reviewed with the subject and questions and concerns were addressed prior to the signing of the consent form.  The subject verbalized understanding of the trial requirements.  The subject agreed to participate in the [place] PACT trial and signed the informed consent.  The informed consent was obtained prior to performance of any protocol-specific procedures for the subject.  A copy of the signed informed consent was given to the subject and a copy was placed in the subject's medical record.  Berneda Rose 09/02/2019, 7:03 PM

## 2019-09-02 NOTE — ED Provider Notes (Signed)
[facility] EMERGENCY DEPARTMENT Provider Note   CSN: [id number] Arrival date & time: 09/02/19  0125     History   Chief Complaint Chief Complaint  Patient presents with  . Abnormal Lab    ABG    HPI Sergio Mitchell is a 68 y.o. male.     Chronic that patient usually assist control with worsening difficulty breathing today since restorative pressure support.  Objective ABG which initially 7.5 with a CO2 of 47 so they reduced pressure and then subsequently patient's ABG showed worsening PCO2 saying increased again and sent here for further evaluation.  Patient was tested positive for COVID on the eighth.   Abnormal Lab Time since result:  2 hours Patient referred by:  Nursing home Resulting agency:  Internal Result type comment:  Abg Shortness of Breath Severity:  Mild Onset quality:  Gradual Duration:  1 day Timing:  Constant Progression:  Worsening Chronicity:  New Context: not activity   Relieved by:  None tried Worsened by:  Nothing Ineffective treatments:  None tried Associated symptoms: no abdominal pain and no fever        Past Medical History:  Diagnosis Date  . DDD (degenerative disc disease), lumbar   . Fibromyositis   . Osteoporosis   . S/P percutaneous endoscopic gastrostomy (PEG) tube placement ([place])   . Scoliosis   . Spinal stenosis   . Systolic CHF, chronic ([place]) 04/2019   EF 20-25%, ACID recommended  . Tracheostomy tube present ([place])   . Ventilator dependence [facility])     Patient Active Problem List   Diagnosis Date Noted  . VABP (ventilator-associated bacterial pneumonia) ([place]) 07/31/2019  . Acute respiratory failure with hypoxia and hypercarbia (HCC)   . Chronic respiratory failure requiring continuous mechanical ventilation through tracheostomy ([place])   . HCAP (healthcare-associated pneumonia)   . Other insomnia 04/20/2017  . Other fatigue 04/20/2017  . Fibromyalgia 10/06/2016  . Primary osteoarthritis of both hands  10/06/2016  . 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 DDDlumbar spine 10/06/2016  . Former smoker 10/06/2016  . Depression 10/06/2016  . Anxiety 10/06/2016  . Atrial fibrillation ([place]) 10/06/2016  . Gastroesophageal reflux disease without esophagitis 10/06/2016    Past Surgical History:  Procedure Laterality Date  . CHOLECYSTECTOMY    . JOINT REPLACEMENT    . REPLACEMENT TOTAL KNEE BILATERAL Bilateral   . TONSILECTOMY, ADENOIDECTOMY, BILATERAL MYRINGOTOMY AND TUBES    . TRACHEOSTOMY          Home Medications    Prior to Admission medications   Medication Sig Start Date End Date Taking? Authorizing Provider  acetaminophen (TYLENOL) 325 MG tablet Place 650 mg into feeding tube every 6 (six) hours as needed for mild pain, fever or headache.    [provider]  albuterol (PROVENTIL) (2.5 MG/3ML) 0.083% nebulizer solution Take 2.5 mg by nebulization every 2 (two) hours as needed for wheezing or shortness of breath.    [provider]  aspirin 81 MG chewable tablet Place 81 mg into feeding tube daily.    [provider]  atorvastatin (LIPITOR) 20 MG tablet Place 20 mg into feeding tube daily.    [provider]  carvedilol (COREG) 12.5 MG tablet Place 12.5 mg into feeding tube 2 (two) times daily.  07/23/16   [provider]  ceFEPIme 1 g in sodium chloride 0.9 % 100 mL Inject 1 g into the vein every 8 (eight) hours. For  seven days Started on 9.4.2020    [provider]  chlorhexidine gluconate, MEDLINE KIT, (PERIDEX) 0.12 % solution Use as directed 15 mLs in the mouth or throat 3 (three) times daily.    [provider]  clonazePAM (KLONOPIN) 1 MG tablet Place 1 mg into feeding tube every 8 (eight) hours.    [provider]  docusate sodium (COLACE) 100 MG capsule 100 mg daily. Per tube    [provider]  escitalopram (LEXAPRO) 10 MG tablet Place 10 mg  into feeding tube daily.    [provider]  furosemide (LASIX) 40 MG tablet Place 40 mg into feeding tube daily.    [provider]  heparin 5000 UNIT/ML injection Inject 5,000 Units into the skin every 8 (eight) hours.    [provider]  ipratropium-albuterol (DUONEB) 0.5-2.5 (3) MG/3ML SOLN Take 3 mLs by nebulization every 6 (six) hours.    [provider]  lansoprazole (PREVACID) 30 MG capsule Place 30 mg into feeding tube daily at 12 noon.    [provider]  miconazole (ANTIFUNGAL) 2 % powder Apply 1 application topically daily.    [provider]  morphine (MSIR) 15 MG tablet Place 15 mg into feeding tube every 8 (eight) hours.    [provider]  Nutritional Supplements (ISOSOURCE 1.5 CAL PO) Give by tube.    [provider]  ondansetron (ZOFRAN) 4 MG tablet Place 4 mg into feeding tube every 8 (eight) hours as needed for nausea or vomiting.    [provider]  polyethylene glycol (MIRALAX) 17 g packet Place 17 g into feeding tube daily.    [provider]  predniSONE (DELTASONE) 10 MG tablet Place 30 mg into feeding tube daily with breakfast.    [provider]  QUEtiapine (SEROQUEL) 50 MG tablet Place 50 mg into feeding tube every 12 (twelve) hours as needed (increased anxiousness/behavior).    [provider]  vancomycin IVPB Inject 1,000 mg into the vein daily. For seven days Started on 9.5.2020    [provider]    Family History Family History  Problem Relation Age of Onset  . Scoliosis Mother   . Fibromyalgia Mother   . Prostate cancer Father     Social History Social History   Tobacco Use  . Smoking status: Former Smoker    Packs/day: 1.50    Years: 52.00    Pack years: 78.00    Types: Cigarettes  . Smokeless tobacco: Never Used  Substance Use Topics  . Alcohol use: No  . Drug use: No     Allergies   Bacitracin, Cefdinir, Lidocaine, Neomycin,  Neosporin af [miconazole], Pantoprazole, Penicillins, Polymyxin b, and Pramoxine   Review of Systems Review of Systems  Constitutional: Negative for fever.  Respiratory: Positive for shortness of breath.   Gastrointestinal: Negative for abdominal pain.  All other systems reviewed and are negative.    Physical Exam Updated Vital Signs BP 98/72   Pulse 98   Temp (!) 100.4 F (38 C) (Oral)   Resp 18   SpO2 95%   Physical Exam Vitals signs and nursing note reviewed.  Constitutional:      Appearance: He is well-developed.  HENT:     Head: Normocephalic and atraumatic.  Neck:     Musculoskeletal: Normal range of motion.  Cardiovascular:     Rate and Rhythm: Normal rate.  Pulmonary:     Effort: Pulmonary effort is normal. Tachypnea present. No accessory muscle usage  or respiratory distress.     Breath sounds: Rales present.  Abdominal:     General: There is no distension.  Musculoskeletal: Normal range of motion.  Neurological:     Mental Status: He is alert.      ED Treatments / Results  Labs (all labs ordered are listed, but only abnormal results are displayed) Labs Reviewed  COMPREHENSIVE METABOLIC PANEL - Abnormal; Notable for the following components:      Result Value   Chloride 95 (*)    CO2 33 (*)    Glucose, Bld 111 (*)    BUN 27 (*)    Albumin 3.2 (*)    All other components within normal limits  CBC WITH DIFFERENTIAL/PLATELET - Abnormal; Notable for the following components:   WBC 16.0 (*)    RBC 3.49 (*)    Hemoglobin 11.3 (*)    HCT 34.9 (*)    Neutro Abs 12.4 (*)    Monocytes Absolute 1.3 (*)    Abs Immature Granulocytes 0.15 (*)    All other components within normal limits  FIBRINOGEN - Abnormal; Notable for the following components:   Fibrinogen 595 (*)    All other components within normal limits  D-DIMER, QUANTITATIVE (NOT AT [facility]) - Abnormal; Notable for the following components:   D-Dimer, Quant 1.84 (*)    All other components within  normal limits  POCT I-STAT 7, (LYTES, BLD GAS, ICA,H+H) - Abnormal; Notable for the following components:   pH, Arterial 7.485 (*)    pCO2 arterial 51.2 (*)    pO2, Arterial 152.0 (*)    Bicarbonate 38.3 (*)    TCO2 40 (*)    Acid-Base Excess 13.0 (*)    HCT 35.0 (*)    Hemoglobin 11.9 (*)    All other components within normal limits  LACTIC ACID, PLASMA  C-REACTIVE PROTEIN  LACTIC ACID, PLASMA  URINALYSIS, ROUTINE W REFLEX MICROSCOPIC  PROCALCITONIN    EKG EKG Interpretation  Date/Time:  Saturday September 02 2019 02:43:49 EDT Ventricular Rate:  96 PR Interval:    QRS Duration: 155 QT Interval:  383 QTC Calculation: 484 R Axis:   120 Text Interpretation:  Sinus rhythm Ventricular premature complex Right atrial enlargement Nonspecific intraventricular conduction delay Probable anterolateral infarct, acute repol changes likely from LBBB Confirmed by Merrily Pew [phone]) on 09/02/2019 5:16:44 AM   Radiology Dg Chest Portable 1 View  Result Date: 09/02/2019 CLINICAL DATA:  eval for hypoxia. [facility]. Per RN pt is covid +, changes in abgeval for hypoxia EXAM: PORTABLE CHEST 1 VIEW COMPARISON:  07/31/2019 FINDINGS: Stable enlarged cardiac silhouette. Improvement aeration to the RIGHT lung base. Elevation of the LEFT hemidiaphragm LEFT lower lobe poorly evaluated. Improved aeration LEFT lung base. IMPRESSION: 1. Improved aeration to LEFT and RIGHT lung base. 2. Elevated LEFT hemidiaphragm. Electronically Signed   By: Suzy Bouchard M.D.   On: 09/02/2019 04:11    Procedures Procedures (including critical care time)  Medications Ordered in ED Medications  sodium chloride flush (NS) 0.9 % injection 3 mL (3 mLs Intravenous Not Given 09/02/19 0342)     Initial Impression / Assessment and Plan / ED Course  I have reviewed the triage vital signs and the nursing notes.  Pertinent labs & imaging results that were available during my care of the patient were  reviewed by me and considered in my medical decision making (see chart for details).  MACLANE HOLLORAN was evaluated in Emergency Department on 09/02/2019 for  the symptoms described in the history of present illness. He was evaluated in the context of the global COVID-19 pandemic, which necessitated consideration that the patient might be at risk for infection with the SARS-CoV-2 virus that causes COVID-19. Institutional protocols and algorithms that pertain to the evaluation of patients at risk for COVID-19 are in a state of rapid change based on information released by regulatory bodies including the CDC and federal and state organizations. These policies and algorithms were followed during the patient's care in the ED.   covid positive with worsening resp status. Plan for admit to [facility] after labs/obtainin fax for positive covid.   Final Clinical Impressions(s) / ED Diagnoses   Final diagnoses:  [id number]    ED Discharge Orders    None       Caterra Ostroff, Corene Cornea, MD 09/02/19 [phone]

## 2019-09-02 NOTE — Progress Notes (Signed)
Brief Nutrition Note  Consult received for enteral/tube feeding initiation and management.  Adult Enteral Nutrition Protocol initiated. Full assessment to follow.  Admitting Dx: IRJJO-84 [U07.1]  There is no height or weight on file to calculate BMI.   Labs:  Recent Labs  Lab 09/02/19 0234 09/02/19 0301  NA 138 140  K 3.8 3.7  CL  --  95*  CO2  --  33*  BUN  --  27*  CREATININE  --  0.77  CALCIUM  --  10.2  GLUCOSE  --  111*    Juda Lajeunesse RD, LDN Clinical Nutrition Pager # [phone]

## 2019-09-02 NOTE — Research (Signed)
   YES NO INCLUSION  [x] [] Age ?18 years (male or male)  [x] [] Acute infection with severe acute respiratory syndrome coronarvirus 2 (SARS-CoV2)  [x] [] Currently admitted to an ICU 1. Where ICU admission occurred < 72 hours prior to randomization.   YES NO EXCLUSION  [] [x] Ongoing (>48hours) or planned full-dose (therapeutic) anticoagulation for any indication  [] [x] Ongoing or planned treatment with dual antiplatelet therapy  [] [x] Contraindication to antithrombotic therapy or high risk of bleeding due to conditions including, but not limited to, any of the following:  [] [x] a) History of intracranial hemorrhage, known CNS tumor        or CNS vascular abnormality  [] [x]  b) Active or recent major bleeding within the past 30 days with untreated source  [] [x] c) Platelet count <70,000 or known functional platelet disorder  [] [x]  d) Fibrinogen <200 mg/dL  [] [x] e) International normalized ratio (INR) >1.9  [] [x] History of heparin-induced thrombocytopenia  [] [x]  Ischemic stroke within the past 2 weeks  [] [x] Pregnancy  [] [x]  Study center employees or their family members  [] [x]  Any condition which in the investigator's assessment might increase the risk to the patient or decrease the chance of obtaining satisfactory data to achieve the objectives of the study  [] [x]  Subjects for whom further care is being forgone at the decision of the subject, family, and/or treating team ("comfort measures only")    

## 2019-09-02 NOTE — Progress Notes (Signed)
ANTICOAGULATION CONSULT NOTE - Initial Consult  Pharmacy Consult for Lovenox Indication: VTE prophylaxis (COVID-PACT trial)  Allergies  Allergen Reactions  . Bacitracin Other (See Comments)    Per MAR  . Cefdinir Other (See Comments)    Per MAR  . Lidocaine Other (See Comments)    Per MAR  . Neomycin Other (See Comments)    Per MAR  . Neosporin Af [Miconazole] Other (See Comments)    Unknown 9.7.2020 Per [facility] MAR pt is currently using Miconazole Topical Poweder  . Pantoprazole Other (See Comments)    unknown  . Penicillins Other (See Comments)    Per MAR  . Polymyxin B Other (See Comments)    Per MAR  . Pramoxine Other (See Comments)    Per [facility]    Patient Measurements: Height: 6\' 2"  (188 cm) Weight: 242 lb 15.2 oz (110.2 kg) IBW/kg (Calculated) : 82.2  Vital Signs: Temp: 99 F (37.2 C) (10/10 1700) Temp Source: Oral (10/10 1700) BP: 137/87 (10/10 1700) Pulse Rate: 80 (10/10 1700)  Labs: Recent Labs    09/02/19 0234 09/02/19 0301  HGB 11.9* 11.3*  HCT 35.0* 34.9*  PLT  --  362  CREATININE  --  0.77    Estimated Creatinine Clearance: 115.1 mL/min (by C-G formula based on SCr of 0.77 mg/dL).   Medical History: Past Medical History:  Diagnosis Date  . DDD (degenerative disc disease), lumbar   . Fibromyositis   . Osteoporosis   . S/P percutaneous endoscopic gastrostomy (PEG) tube placement ([place])   . Scoliosis   . Spinal stenosis   . Systolic CHF, chronic ([place]) 04/2019   EF 20-25%, ACID recommended  . Tracheostomy tube present ([place])   . Ventilator dependence (HCC)     Medications:  Scheduled:  . sodium chloride   Intravenous Once  . aspirin  81 mg Per Tube Daily  . atorvastatin  20 mg Per Tube Daily  . carvedilol  12.5 mg Per Tube BID WC  . Chlorhexidine Gluconate Cloth  6 each Topical Daily  . clonazePAM  1 mg Per Tube Q8H  . docusate sodium  100 mg Oral Daily  . enoxaparin (LOVENOX) injection  55 mg Subcutaneous Q12H  . escitalopram  10 mg  Per Tube Daily  . feeding supplement (PRO-STAT SUGAR FREE 64)  30 mL Per Tube BID  . feeding supplement (VITAL HIGH PROTEIN)  1,000 mL Per Tube Q24H  . ipratropium-albuterol  3 mL Nebulization Q6H  . lansoprazole  30 mg Per Tube BID  . methylPREDNISolone (SOLU-MEDROL) injection  55 mg Intravenous Q12H  . morphine  15 mg Per Tube Q8H  . polyethylene glycol  17 g Per Tube Daily  . sodium chloride flush  3 mL Intravenous Q12H  . sodium chloride flush  3 mL Intravenous Q12H   Infusions:  . sodium chloride    . [START ON 09/03/2019] remdesivir 100 mg in NS 250 mL     PRN: sodium chloride, acetaminophen, albuterol, bisacodyl, ondansetron **OR** ondansetron (ZOFRAN) IV, oxyCODONE, QUEtiapine, sodium chloride flush, zolpidem  Assessment: 68 yo male [facility] 10/10 for hypoxemia due to COVID 19 pneumonia.  Patient has been enrolled into the COVID-PACT study, assigned to Lovenox VTE prophylaxis arm.   Weight 110.2kg  BMI = 31  SCr 0.77  CrCl 115 ml/min  D dimer 1.84  Goal of Therapy:  Prevention of VTE Monitor platelets by anticoagulation protocol: Yes   Plan:  Adjust Lovenox to 40mg  SQ q24h per  COVID-PACT protocol Monitor for signs/symptoms of bleeding  Loralee Pacas, PharmD, BCPS Pharmacy: [phone] 09/02/2019,7:08 PM

## 2019-09-02 NOTE — ED Triage Notes (Signed)
Per EMS pt had abnormal ABG labs at [facility] and covid +. Has trach, feeding tube, and urinary cath.

## 2019-09-02 NOTE — ED Notes (Signed)
ED TO INPATIENT HANDOFF REPORT  ED Nurse Name and Phone #: [email or web address] [phone]  S Name/Age/Gender Sergio Mitchell 68 y.o. male Room/Bed: 021C/021C  Code Status   Code Status: DNR  Home/SNF/Other Skilled nursing facility Patient oriented to: self, place and situation Is this baseline? Yes   Triage Complete: Triage complete  Chief Complaint Covid positive  Triage Note Per EMS pt had abnormal ABG labs at [facility] and covid +. Has trach, feeding tube, and urinary cath.   Allergies Allergies  Allergen Reactions  . Bacitracin Other (See Comments)    Per MAR  . Cefdinir Other (See Comments)    Per MAR  . Lidocaine Other (See Comments)    Per MAR  . Neomycin Other (See Comments)    Per MAR  . Neosporin Af [Miconazole] Other (See Comments)    Unknown 9.7.2020 Per [facility] MAR pt is currently using Miconazole Topical Poweder  . Pantoprazole Other (See Comments)    unknown  . Penicillins Other (See Comments)    Per MAR  . Polymyxin B Other (See Comments)    Per MAR  . Pramoxine Other (See Comments)    Per MAR    Level of Care/Admitting Diagnosis ED Disposition    ED Disposition Condition Comment   Admit  Hospital Area: [facility] [[id number]]  Level of Care: ICU [6]  Covid Evaluation: Confirmed COVID Positive  Diagnosis: Respiratory tract infection due to COVID-19 virus [[id number]]  Admitting Physician: Jonah Blue [[id number]]  Attending Physician: Jonah Blue [[id number]]  Estimated length of stay: 5 - 7 days  Certification:: I certify this patient will need inpatient services for at least 2 midnights  PT Class (Do Not Modify): Inpatient [101]  PT Acc Code (Do Not Modify): Private [1]       B Medical/Surgery History Past Medical History:  Diagnosis Date  . DDD (degenerative disc disease), lumbar   . Fibromyositis   . Osteoporosis   . S/P percutaneous endoscopic gastrostomy (PEG) tube placement (HCC)   . Scoliosis   . Spinal stenosis    . Systolic CHF, chronic (HCC) 04/2019   EF 20-25%, ACID recommended  . Tracheostomy tube present (HCC)   . Ventilator dependence [facility])    Past Surgical History:  Procedure Laterality Date  . CHOLECYSTECTOMY    . JOINT REPLACEMENT    . REPLACEMENT TOTAL KNEE BILATERAL Bilateral   . TONSILECTOMY, ADENOIDECTOMY, BILATERAL MYRINGOTOMY AND TUBES    . TRACHEOSTOMY       A IV Location/Drains/Wounds Patient Lines/Drains/Airways Status   Active Line/Drains/Airways    Name:   Placement date:   Placement time:   Site:   Days:   Peripheral IV 09/02/19 Left Hand   09/02/19    0258    Hand   less than 1   Urethral Catheter Non-latex 16 Fr.   07/31/19    -    Non-latex   33   Airway   -    -        Tracheostomy Shiley 7 mm Cuffed;Proximal;Other (Comment)   -    -    7 mm             Intake/Output Last 24 hours  Intake/Output Summary (Last 24 hours) at 09/02/2019 1506 Last data filed at 09/02/2019 1115 Gross per 24 hour  Intake 250 ml  Output -  Net 250 ml    Labs/Imaging Results for orders placed or performed during the hospital encounter of 09/02/19 (from the  past 48 hour(s))  I-STAT 7, (LYTES, BLD GAS, ICA, H+H)     Status: Abnormal   Collection Time: 09/02/19  2:34 AM  Result Value Ref Range   pH, Arterial 7.485 (H) 7.350 - 7.450   pCO2 arterial 51.2 (H) 32.0 - 48.0 mmHg   pO2, Arterial 152.0 (H) 83.0 - 108.0 mmHg   Bicarbonate 38.3 (H) 20.0 - 28.0 mmol/L   TCO2 40 (H) 22 - 32 mmol/L   O2 Saturation 99.0 %   Acid-Base Excess 13.0 (H) 0.0 - 2.0 mmol/L   Sodium 138 135 - 145 mmol/L   Potassium 3.8 3.5 - 5.1 mmol/L   Calcium, Ion 1.36 1.15 - 1.40 mmol/L   HCT 35.0 (L) 39.0 - 52.0 %   Hemoglobin 11.9 (L) 13.0 - 17.0 g/dL   Patient temperature 100.4 F    Collection site RADIAL, ALLEN'S TEST ACCEPTABLE    Drawn by RT    Sample type ARTERIAL   Lactic acid, plasma     Status: None   Collection Time: 09/02/19  3:01 AM  Result Value Ref Range   Lactic Acid, Venous 1.0 0.5 -  1.9 mmol/L    Comment: Performed at [facility], [address] [place], [place] [address]  Comprehensive metabolic panel     Status: Abnormal   Collection Time: 09/02/19  3:01 AM  Result Value Ref Range   Sodium 140 135 - 145 mmol/L   Potassium 3.7 3.5 - 5.1 mmol/L   Chloride 95 (L) 98 - 111 mmol/L   CO2 33 (H) 22 - 32 mmol/L   Glucose, Bld 111 (H) 70 - 99 mg/dL   BUN 27 (H) 8 - 23 mg/dL   Creatinine, Ser 0.77 0.61 - 1.24 mg/dL   Calcium 10.2 8.9 - 10.3 mg/dL   Total Protein 6.9 6.5 - 8.1 g/dL   Albumin 3.2 (L) 3.5 - 5.0 g/dL   AST 23 15 - 41 U/L   ALT 25 0 - 44 U/L   Alkaline Phosphatase 43 38 - 126 U/L   Total Bilirubin 0.3 0.3 - 1.2 mg/dL   GFR calc non Af Amer >60 >60 mL/min   GFR calc Af Amer >60 >60 mL/min   Anion gap 12 5 - 15    Comment: Performed at [facility], [address], [place], [place] [address]  CBC with Differential     Status: Abnormal   Collection Time: 09/02/19  3:01 AM  Result Value Ref Range   WBC 16.0 (H) 4.0 - 10.5 K/uL   RBC 3.49 (L) 4.22 - 5.81 MIL/uL   Hemoglobin 11.3 (L) 13.0 - 17.0 g/dL   HCT 34.9 (L) 39.0 - 52.0 %   MCV 100.0 80.0 - 100.0 fL   MCH 32.4 26.0 - 34.0 pg   MCHC 32.4 30.0 - 36.0 g/dL   RDW 13.9 11.5 - 15.5 %   Platelets 362 150 - 400 K/uL   nRBC 0.0 0.0 - 0.2 %   Neutrophils Relative % 78 %   Neutro Abs 12.4 (H) 1.7 - 7.7 K/uL   Lymphocytes Relative 12 %   Lymphs Abs 1.9 0.7 - 4.0 K/uL   Monocytes Relative 8 %   Monocytes Absolute 1.3 (H) 0.1 - 1.0 K/uL   Eosinophils Relative 1 %   Eosinophils Absolute 0.2 0.0 - 0.5 K/uL   Basophils Relative 0 %   Basophils Absolute 0.1 0.0 - 0.1 K/uL   Immature Granulocytes 1 %   Abs Immature Granulocytes  0.15 (H) 0.00 - 0.07 K/uL    Comment: Performed at [facility], [address], [place], [place] [address]  Urinalysis, Routine w reflex microscopic     Status: Abnormal   Collection Time: 09/02/19  3:01 AM  Result Value Ref Range   Color, Urine RED (A) YELLOW     Comment: BIOCHEMICALS MAY BE AFFECTED BY COLOR   APPearance TURBID (A) CLEAR   Specific Gravity, Urine  1.005 - 1.030    TEST NOT REPORTED DUE TO COLOR INTERFERENCE OF URINE PIGMENT   pH  5.0 - 8.0    TEST NOT REPORTED DUE TO COLOR INTERFERENCE OF URINE PIGMENT   Glucose, UA NEGATIVE NEGATIVE mg/dL   Hgb urine dipstick NEGATIVE NEGATIVE   Bilirubin Urine NEGATIVE NEGATIVE   Ketones, ur NEGATIVE NEGATIVE mg/dL   Protein, ur NEGATIVE NEGATIVE mg/dL   Nitrite NEGATIVE NEGATIVE   Leukocytes,Ua NEGATIVE NEGATIVE    Comment: Performed at [facility], [address], [place], [place] [address]  Urinalysis, Microscopic (reflex)     Status: Abnormal   Collection Time: 09/02/19  3:01 AM  Result Value Ref Range   RBC / HPF >50 0 - 5 RBC/hpf   WBC, UA 0-5 0 - 5 WBC/hpf   Bacteria, UA MANY (A) NONE SEEN   Squamous Epithelial / LPF 0-5 0 - 5   Urine-Other LESS THAN 10 mL OF URINE SUBMITTED     Comment: Performed at [facility], [address] [place], [place] [address]  C-reactive protein     Status: None   Collection Time: 09/02/19  3:24 AM  Result Value Ref Range   CRP <0.8 <1.0 mg/dL    Comment: Performed at [facility], [address], [place], [place] [address]  Fibrinogen     Status: Abnormal   Collection Time: 09/02/19  3:24 AM  Result Value Ref Range   Fibrinogen 595 (H) 210 - 475 mg/dL    Comment: Performed at [facility], [address] [place], [place] [address]  D-dimer, quantitative (not at [facility])     Status: Abnormal   Collection Time: 09/02/19  3:24 AM  Result Value Ref Range   D-Dimer, Quant 1.84 (H) 0.00 - 0.50 ug/mL-FEU    Comment: (NOTE) At the manufacturer cut-off of 0.50 ug/mL FEU, this assay has been documented to exclude PE with a sensitivity and negative predictive value of 97 to 99%.  At this time, this assay has not been approved by the FDA to exclude DVT/VTE. Results should be correlated with clinical presentation. Performed at  [facility], [address], [place], [place] [address]   Procalcitonin     Status: None   Collection Time: 09/02/19  3:24 AM  Result Value Ref Range   Procalcitonin <0.10 ng/mL    Comment:        Interpretation: PCT (Procalcitonin) <= 0.5 ng/mL: Systemic infection (sepsis) is not likely. Local bacterial infection is possible. (NOTE)       Sepsis PCT Algorithm           Lower Respiratory Tract                                      Infection PCT Algorithm    ----------------------------     ----------------------------         PCT < 0.25 ng/mL  PCT < 0.10 ng/mL         Strongly encourage             Strongly discourage   discontinuation of antibiotics    initiation of antibiotics    ----------------------------     -----------------------------       PCT 0.25 - 0.50 ng/mL            PCT 0.10 - 0.25 ng/mL               OR       >80% decrease in PCT            Discourage initiation of                                            antibiotics      Encourage discontinuation           of antibiotics    ----------------------------     -----------------------------         PCT >= 0.50 ng/mL              PCT 0.26 - 0.50 ng/mL               AND        <80% decrease in PCT             Encourage initiation of                                             antibiotics       Encourage continuation           of antibiotics    ----------------------------     -----------------------------        PCT >= 0.50 ng/mL                  PCT > 0.50 ng/mL               AND         increase in PCT                  Strongly encourage                                      initiation of antibiotics    Strongly encourage escalation           of antibiotics                                     -----------------------------                                           PCT <= 0.25 ng/mL                                                 OR                                        >  80% decrease in  PCT                                     Discontinue / Do not initiate                                             antibiotics Performed at [facility], [address] [place], [place] [address]   HIV Antibody (routine testing w rflx)     Status: None   Collection Time: 09/02/19  9:42 AM  Result Value Ref Range   HIV Screen 4th Generation wRfx NON REACTIVE NON REACTIVE    Comment: Performed at [facility], [address] [place], [place] [address]   Dg Chest Portable 1 View  Result Date: 09/02/2019 CLINICAL DATA:  eval for hypoxia. [facility]. Per RN pt is covid +, changes in abgeval for hypoxia EXAM: PORTABLE CHEST 1 VIEW COMPARISON:  07/31/2019 FINDINGS: Stable enlarged cardiac silhouette. Improvement aeration to the RIGHT lung base. Elevation of the LEFT hemidiaphragm LEFT lower lobe poorly evaluated. Improved aeration LEFT lung base. IMPRESSION: 1. Improved aeration to LEFT and RIGHT lung base. 2. Elevated LEFT hemidiaphragm. Electronically Signed   By: Genevive BiStewart  Edmunds M.D.   On: 09/02/2019 04:11    Pending Labs Unresulted Labs (From admission, onward)    Start     Ordered   09/03/19 0500  CBC with Differential/Platelet  Daily,   R     09/02/19 0828   09/03/19 0500  Comprehensive metabolic panel  Daily,   R     09/02/19 0828   09/03/19 0500  C-reactive protein  Daily,   R     09/02/19 0828   09/03/19 0500  D-dimer, quantitative (not at [facility])  Daily,   R     09/02/19 0828   09/03/19 0500  Ferritin  Daily,   R     09/02/19 0828   09/03/19 0500  Magnesium  Daily,   R     09/02/19 0828   09/03/19 0500  Phosphorus  Daily,   R     09/02/19 0828   09/02/19 0826  HIV4GL Save Tube  (HIV Antibody (Routine testing w reflex) panel)  Once,   STAT     09/02/19 0828          Vitals/Pain Today's Vitals   09/02/19 1300 09/02/19 1330 09/02/19 1400 09/02/19 1430  BP: 109/76 115/76 124/80 114/84  Pulse: 83 81 81 83  Resp: (!) 24 (!) 24 (!) 24 (!) 23   Temp:      TempSrc:      SpO2: 96% 96% 97% 97%    Isolation Precautions Airborne and Contact precautions  Medications Medications  aspirin chewable tablet 81 mg (81 mg Per Tube Given 09/02/19 1156)  morphine (MSIR) tablet 15 mg (15 mg Per Tube Given 09/02/19 1156)  atorvastatin (LIPITOR) tablet 20 mg (20 mg Per Tube Given 09/02/19 1156)  carvedilol (COREG) tablet 12.5 mg (12.5 mg Per Tube Given 09/02/19 1433)  escitalopram (LEXAPRO) tablet 10 mg (10 mg Per Tube Given 09/02/19 1156)  QUEtiapine (SEROQUEL) tablet 50 mg (has no administration in time range)  docusate sodium (COLACE) capsule 100 mg (100 mg Oral Given 09/02/19 1155)  polyethylene glycol (MIRALAX / GLYCOLAX)  packet 17 g (17 g Per Tube Given 09/02/19 1433)  clonazePAM (KLONOPIN) tablet 1 mg (1 mg Per Tube Given 09/02/19 1155)  enoxaparin (LOVENOX) injection 40 mg (has no administration in time range)  sodium chloride flush (NS) 0.9 % injection 3 mL (3 mLs Intravenous Given 09/02/19 1155)  sodium chloride flush (NS) 0.9 % injection 3 mL (has no administration in time range)  0.9 %  sodium chloride infusion (has no administration in time range)  acetaminophen (TYLENOL) tablet 650 mg (has no administration in time range)  oxyCODONE (Oxy IR/ROXICODONE) immediate release tablet 5 mg (has no administration in time range)  zolpidem (AMBIEN) tablet 5 mg (has no administration in time range)  bisacodyl (DULCOLAX) EC tablet 5 mg (has no administration in time range)  ondansetron (ZOFRAN) tablet 4 mg (has no administration in time range)    Or  ondansetron (ZOFRAN) injection 4 mg (has no administration in time range)  sodium chloride flush (NS) 0.9 % injection 3 mL (3 mLs Intravenous Given 09/02/19 1155)  methylPREDNISolone sodium succinate (SOLU-MEDROL) injection 0.5 mg/kg (has no administration in time range)  Ipratropium-Albuterol (COMBIVENT) respimat 1 puff (1 puff Inhalation Not Given 09/02/19 1458)  albuterol (VENTOLIN HFA)  108 (90 Base) MCG/ACT inhaler 2 puff (has no administration in time range)  remdesivir 200 mg in sodium chloride 0.9 % 250 mL IVPB (0 mg Intravenous Stopped 09/02/19 1115)    Followed by  remdesivir 100 mg in sodium chloride 0.9 % 250 mL IVPB (has no administration in time range)  lansoprazole (PREVACID SOLUTAB) disintegrating tablet 30 mg (has no administration in time range)    Mobility non-ambulatory Low fall risk   Focused Assessments Pulmonary Assessment Handoff:  Lung sounds: Bilateral Breath Sounds: Rhonchi, Diminished L Breath Sounds: Diminished R Breath Sounds: Diminished O2 Device: Ventilator        R Recommendations: See Admitting Provider Note  Report given to:   Additional Notes:

## 2019-09-02 NOTE — Progress Notes (Signed)
NAME:  Sergio Mitchell, MRN:  [id number], DOB:  October 05, 1951, LOS: 0 ADMISSION DATE:  09/02/2019, CONSULTATION DATE:  10/10 REFERRING MD:  Lorin Mercy, CHIEF COMPLAINT:  Hypoxemia   Brief History   68 y/o male admitted from [place] to [facility] on 10/10 for hypoxemia due to COVID 19 pneumonia.  At baseline the patient has ventilator dependent respiratory failure and systolic heart failure.   History of present illness   68 y/o male with multiple medical problems including systolic heart failure, chronic respiratory failure wth hypoxemia.  At baseline the patient had minimal medical problems prior to 03/2019 when he was admitted to a hospital in [place] with cardiogenic shock, LVEF noted to be 20-25%.  During his hospitalization he required mechanical ventilation, eventually a tracheostomy, and developed septic shock.  He was eventually discharged to [facility] in mid July 2020 where he remained ventilator dependent but was able to perform some ventilator weaning.  He was noted to have COVID on 10/7 and has developed hypoxemia since.  He was brought to the [facility] ER and transferred to [facility].  The patient could provide no history.  Past Medical History  Chronic respiratory failure with hypoxemia, ventilator dependent Tracheostomy status Chronic systolic heart faiulre Fibromyalgia Chronic pain Osteoporosis Scoliosis Spinal stenosis  Significant Hospital Events   10/10 admission  Consults:  PCCM  Procedures:  Tracheostomy, chronic (May 2020?)  Significant Diagnostic Tests:    Micro Data:  10/7 SARS COV 2 Positive [facility])  Antimicrobials/COVID Treatment  10/10 Convalescent plasma > 10/10 decadron >  10/10 remdesivir >    Interim history/subjective:  As above  Objective   Blood pressure 108/81, pulse 79, temperature 98.3 F (36.8 C), temperature source Oral, resp. rate (!) 24, SpO2 96 %.    Vent Mode: PRVC FiO2 (%):  [50 %-65 %] 65 % Set Rate:  [24 bmp] 24 bmp Vt  Set:  [500 mL] 500 mL PEEP:  [5 cmH20] 5 cmH20 Plateau Pressure:  [16 cmH20-29 cmH20] 29 cmH20   Intake/Output Summary (Last 24 hours) at 09/02/2019 1535 Last data filed at 09/02/2019 1115 Gross per 24 hour  Intake 250 ml  Output -  Net 250 ml   There were no vitals filed for this visit.  Examination:  General:  In bed on vent HENT: NCAT tracheostomy in place PULM: wheezing bilaterally B, vent supported breathing CV: RRR, no mgr GI: BS+, soft, nontender MSK: normal bulk and tone Neuro: awake, alert, following commands, moves all four ext   10/10 CXR images personally reviewed: tracheostomy in place, no real infiltrate noted, some plate-like atelectasis R base  Resolved Hospital Problem list     Assessment & Plan:  Acute on chronic respiratory failure with hypoxemia due to COVID 19 pneumonia, generalized weakness, chronic systolic heart failure: ABG not too bad, only minimal atelectasis on CXR; given comorbid illnesses he is very high risk for death from COVID-19 Admit to ICU Plan full vent support on mechanical ventilator: PRVC  Tracheostomy care per routine Pulmonary toilette Decadron> continue home prednisone instead (indication?) remdesivir Convalescent plasma: discussed risks and benefits, gave FDA Fact sheet, he is interested and gave written consent after informed consent conversation Plan weaning ventilator on 15/52  Chronic systolic heart failure Tele Continue home coreg Monitor I/O and hemodynamics  Physical deconditioning PT/OT consult as able  Best practice:  Diet: tube feeding, allow to take food by mouth as well Pain/Anxiety/Delirium protocol (if indicated): no VAP protocol (if indicated): yes DVT prophylaxis: interested in  COVID PACT trial, for now use lovenox 0.26m/kg sub cutaneous q12h per COVID ICU protocol GI prophylaxis: lansoprazole for stress ulcer prophylaxis Glucose control: SSI Mobility: bed rest Code Status: DNR Family Communication:  per TRH Disposition:   Labs   CBC: Recent Labs  Lab 09/02/19 0234 09/02/19 0301  WBC  --  16.0*  NEUTROABS  --  12.4*  HGB 11.9* 11.3*  HCT 35.0* 34.9*  MCV  --  100.0  PLT  --  3981   Basic Metabolic Panel: Recent Labs  Lab 09/02/19 0234 09/02/19 0301  NA 138 140  K 3.8 3.7  CL  --  95*  CO2  --  33*  GLUCOSE  --  111*  BUN  --  27*  CREATININE  --  0.77  CALCIUM  --  10.2   GFR: CrCl cannot be calculated (Unknown ideal weight.). Recent Labs  Lab 09/02/19 0301 09/02/19 0324  PROCALCITON  --  <0.10  WBC 16.0*  --   LATICACIDVEN 1.0  --     Liver Function Tests: Recent Labs  Lab 09/02/19 0301  AST 23  ALT 25  ALKPHOS 43  BILITOT 0.3  PROT 6.9  ALBUMIN 3.2*   No results for input(s): LIPASE, AMYLASE in the last 168 hours. No results for input(s): AMMONIA in the last 168 hours.  ABG    Component Value Date/Time   PHART 7.485 (H) 09/02/2019 0234   PCO2ART 51.2 (H) 09/02/2019 0234   PO2ART 152.0 (H) 09/02/2019 0234   HCO3 38.3 (H) 09/02/2019 0234   TCO2 40 (H) 09/02/2019 0234   O2SAT 99.0 09/02/2019 0234     Coagulation Profile: No results for input(s): INR, PROTIME in the last 168 hours.  Cardiac Enzymes: No results for input(s): CKTOTAL, CKMB, CKMBINDEX, TROPONINI in the last 168 hours.  HbA1C: No results found for: HGBA1C  CBG: No results for input(s): GLUCAP in the last 168 hours.  Review of Systems:   Cannot obtain due to chronic trach, inability to talk  Past Medical History  He,  has a past medical history of DDD (degenerative disc disease), lumbar, Fibromyositis, Osteoporosis, S/P percutaneous endoscopic gastrostomy (PEG) tube placement ([facility], Scoliosis, Spinal stenosis, Systolic CHF, chronic ([place] (04/2019), Trache[place] tube present ([place], and Ventilator dependence ([place].   Surgical History    Past Surgical History:  Procedure Laterality Date  . CHOLECYSTECTOMY    . JOINT REPLACEMENT    . REPLACEMENT TOTAL KNEE BILATERAL  Bilateral   . TONSILECTOMY, ADENOIDECTOMY, BILATERAL MYRINGOTOMY AND TUBES    . TRACHEOSTOMY       Social History   reports that he has quit smoking. His smoking use included cigarettes. He has a 78.00 pack-year smoking history. He has never used smokeless tobacco. He reports that he does not drink alcohol or use drugs.   Family History   His family history includes Fibromyalgia in his mother; Prostate cancer in his father; Scoliosis in his mother.   Allergies Allergies  Allergen Reactions  . Bacitracin Other (See Comments)    Per MAR  . Cefdinir Other (See Comments)    Per MAR  . Lidocaine Other (See Comments)    Per MAR  . Neomycin Other (See Comments)    Per MAR  . Neosporin Af [Miconazole] Other (See Comments)    Unknown 9.7.2020 Per [facility] MAR pt is currently using Miconazole Topical Poweder  . Pantoprazole Other (See Comments)    unknown  . Penicillins Other (See Comments)    Per MAR  .  Polymyxin B Other (See Comments)    Per MAR  . Pramoxine Other (See Comments)    Per MAR     Home Medications  Prior to Admission medications   Medication Sig Start Date End Date Taking? Authorizing Provider  acetaminophen (TYLENOL) 325 MG tablet Place 650 mg into feeding tube every 6 (six) hours as needed for mild pain, fever or headache.   Yes [provider]  albuterol (PROVENTIL) (2.5 MG/3ML) 0.083% nebulizer solution Take 2.5 mg by nebulization every 2 (two) hours as needed for wheezing or shortness of breath.   Yes [provider]  aspirin 81 MG chewable tablet Place 81 mg into feeding tube daily.   Yes [provider]  atorvastatin (LIPITOR) 20 MG tablet Place 20 mg into feeding tube daily.   Yes [provider]  carvedilol (COREG) 12.5 MG tablet Place 12.5 mg into feeding tube 2 (two) times daily.  07/23/16  Yes [provider]  chlorhexidine gluconate, MEDLINE KIT, (PERIDEX) 0.12 % solution Use as directed 15 mLs in the mouth or  throat 3 (three) times daily.   Yes [provider]  Cholecalciferol (VITAMIN D3) 125 MCG (5000 UT) CAPS Take 5,000 Units by mouth daily.   Yes [provider]  clonazePAM (KLONOPIN) 1 MG tablet Place 1 mg into feeding tube every 8 (eight) hours.   Yes [provider]  docusate sodium (COLACE) 100 MG capsule Take 200 mg by mouth daily. Per tube    Yes [provider]  escitalopram (LEXAPRO) 10 MG tablet Place 10 mg into feeding tube daily.   Yes [provider]  furosemide (LASIX) 40 MG tablet Place 40 mg into feeding tube daily.   Yes [provider]  heparin 5000 UNIT/ML injection Inject 5,000 Units into the skin every 8 (eight) hours.   Yes [provider]  ipratropium-albuterol (DUONEB) 0.5-2.5 (3) MG/3ML SOLN Take 3 mLs by nebulization every 6 (six) hours.   Yes [provider]  lansoprazole (PREVACID) 30 MG capsule Place 30 mg into feeding tube daily at 12 noon.   Yes [provider]  miconazole (ANTIFUNGAL) 2 % powder Apply 1 application topically daily.   Yes [provider]  morphine (MSIR) 15 MG tablet Place 15 mg into feeding tube every 8 (eight) hours.   Yes [provider]  Nutritional Supplements (ISOSOURCE 1.5 CAL) LIQD Take 60 mLs by mouth 3 (three) times daily.   Yes [provider]  ondansetron (ZOFRAN) 4 MG tablet Place 4 mg into feeding tube every 8 (eight) hours as needed for nausea or vomiting.   Yes [provider]  polyethylene glycol (MIRALAX) 17 g packet Place 17 g into feeding tube daily.   Yes [provider]  predniSONE (DELTASONE) 10 MG tablet Place 30 mg into feeding tube daily with breakfast.   Yes [provider]  vitamin C (ASCORBIC ACID) 500 MG tablet Take 500 mg by mouth daily.   Yes [provider]  zinc gluconate 50 MG tablet Take 50 mg by mouth daily.   Yes [provider]  QUEtiapine (SEROQUEL) 50 MG tablet  Place 50 mg into feeding tube every 12 (twelve) hours as needed (increased anxiousness/behavior).    [provider]     Critical care time: 35 minutes     Roselie Awkward, MD [place] [facility] Pager: [phone] Cell: [phone] If no response, call [phone]

## 2019-09-02 NOTE — H&P (Signed)
History and Physical    Sergio Mitchell MGQ:[id number] DOB: 06/10/1951 DOA: 09/02/2019  PCP: [name], MD Consultants:  None Patient coming from: [facility]; NOK: [name], [phone] (mobile), [phone] (home)   Chief Complaint: COVID-19 infection  HPI: Sergio Mitchell is a 68 y.o. male with medical history significant of trach-dependent respiratory failure with PEG tube; chronic systolic CHF; and afib presenting with COVID-19 infection.   The patient is a tragic one who appeared to be in reasonably good health other than fibromyalgia and chronic pain until he was admitted at [facility] on 5/25.  He presented with CP and SOB and eventually was diagnosed with cardiogenic shock with EF 20-25%, recommended for AICD.  However, he also developed septic shock from HCAP with BAL positive for H flu.  He required trach/peg and was discharged to [facility].  He also had encephalopathy with anxiety and was treated by psychiatry.  He was admitted to [place] on 7/15 and has been tolerating some CPAP and T-bar trials.    He was treated recently [facility] for HCAP and was sent to [facility] for evaluation; I consulted on him on 9/7 and he was stable for d/c back to [facility] at that time.    He returns today with COVID-19 and hypoxia.  He is unable to provide history.  I spoke with his sister and brother-in-law.  They had many excellent questions about treatments and prognosis and understand the severity of his condition.  His sister reports that he would prefer to be DNR at this time.    ED Course:  Carryover, as per Dr. Jonelle Sidle:  Sergio Mitchell who is trach and Vent dependent from [facility] diagnosed with Covid 19 and sent to the er as they don't keep Covid pt. He seems to be at his baseline   Review of Systems: unable to perform   Ambulatory Status:  Nonambulatory  Past Medical History:  Diagnosis Date  . DDD (degenerative disc disease), lumbar   . Fibromyositis   . Osteoporosis   . S/P  percutaneous endoscopic gastrostomy (PEG) tube placement ([place])   . Scoliosis   . Spinal stenosis   . Systolic CHF, chronic ([place]) 04/2019   EF 20-25%, ACID recommended  . Tracheostomy tube present ([place])   . Ventilator dependence [facility])     Past Surgical History:  Procedure Laterality Date  . CHOLECYSTECTOMY    . JOINT REPLACEMENT    . REPLACEMENT TOTAL KNEE BILATERAL Bilateral   . TONSILECTOMY, ADENOIDECTOMY, BILATERAL MYRINGOTOMY AND TUBES    . TRACHEOSTOMY      Social History   Socioeconomic History  . Marital status: Married    Spouse name: Not on file  . Number of children: Not on file  . Years of education: Not on file  . Highest education level: Not on file  Occupational History  . Not on file  Social Needs  . Financial resource strain: Not on file  . Food insecurity    Worry: Not on file    Inability: Not on file  . Transportation needs    Medical: Not on file    Non-medical: Not on file  Tobacco Use  . Smoking status: Former Smoker    Packs/day: 1.50    Years: 52.00    Pack years: 78.00    Types: Cigarettes  . Smokeless tobacco: Never Used  Substance and Sexual Activity  . Alcohol use: No  . Drug use: No  . Sexual activity: Not on file  Lifestyle  .  Physical activity    Days per week: Not on file    Minutes per session: Not on file  . Stress: Not on file  Relationships  . Social Herbalist on phone: Not on file    Gets together: Not on file    Attends religious service: Not on file    Active member of club or organization: Not on file    Attends meetings of clubs or organizations: Not on file    Relationship status: Not on file  . Intimate partner violence    Fear of current or ex partner: Not on file    Emotionally abused: Not on file    Physically abused: Not on file    Forced sexual activity: Not on file  Other Topics Concern  . Not on file  Social History Narrative  . Not on file    Allergies  Allergen Reactions  .  Bacitracin Other (See Comments)    Per MAR  . Cefdinir Other (See Comments)    Per MAR  . Lidocaine Other (See Comments)    Per MAR  . Neomycin Other (See Comments)    Per MAR  . Neosporin Af [Miconazole] Other (See Comments)    Unknown 9.7.2020 Per [facility] MAR pt is currently using Miconazole Topical Poweder  . Pantoprazole Other (See Comments)    unknown  . Penicillins Other (See Comments)    Per MAR  . Polymyxin B Other (See Comments)    Per MAR  . Pramoxine Other (See Comments)    Per MAR    Family History  Problem Relation Age of Onset  . Scoliosis Mother   . Fibromyalgia Mother   . Prostate cancer Father     Prior to Admission medications   Medication Sig Start Date End Date Taking? Authorizing Provider  acetaminophen (TYLENOL) 325 MG tablet Place 650 mg into feeding tube every 6 (six) hours as needed for mild pain, fever or headache.    [provider]  albuterol (PROVENTIL) (2.5 MG/3ML) 0.083% nebulizer solution Take 2.5 mg by nebulization every 2 (two) hours as needed for wheezing or shortness of breath.    [provider]  aspirin 81 MG chewable tablet Place 81 mg into feeding tube daily.    [provider]  atorvastatin (LIPITOR) 20 MG tablet Place 20 mg into feeding tube daily.    [provider]  carvedilol (COREG) 12.5 MG tablet Place 12.5 mg into feeding tube 2 (two) times daily.  07/23/16   [provider]  ceFEPIme 1 g in sodium chloride 0.9 % 100 mL Inject 1 g into the vein every 8 (eight) hours. For seven days Started on 9.4.2020    [provider]  chlorhexidine gluconate, MEDLINE KIT, (PERIDEX) 0.12 % solution Use as directed 15 mLs in the mouth or throat 3 (three) times daily.    [provider]  clonazePAM (KLONOPIN) 1 MG tablet Place 1 mg into feeding tube every 8 (eight) hours.    [provider]  docusate sodium (COLACE) 100 MG capsule 100 mg daily. Per tube    [provider]  escitalopram (LEXAPRO) 10 MG tablet Place 10 mg into feeding tube daily.    [provider]  furosemide (LASIX) 40 MG tablet Place 40 mg into feeding tube daily.    [provider]  heparin 5000 UNIT/ML injection Inject 5,000 Units into the skin every 8 (eight) hours.    [provider]  ipratropium-albuterol (  DUONEB) 0.5-2.5 (3) MG/3ML SOLN Take 3 mLs by nebulization every 6 (six) hours.    [provider]  lansoprazole (PREVACID) 30 MG capsule Place 30 mg into feeding tube daily at 12 noon.    [provider]  miconazole (ANTIFUNGAL) 2 % powder Apply 1 application topically daily.    [provider]  morphine (MSIR) 15 MG tablet Place 15 mg into feeding tube every 8 (eight) hours.    [provider]  Nutritional Supplements (ISOSOURCE 1.5 CAL PO) Give by tube.    [provider]  ondansetron (ZOFRAN) 4 MG tablet Place 4 mg into feeding tube every 8 (eight) hours as needed for nausea or vomiting.    [provider]  polyethylene glycol (MIRALAX) 17 g packet Place 17 g into feeding tube daily.    [provider]  predniSONE (DELTASONE) 10 MG tablet Place 30 mg into feeding tube daily with breakfast.    [provider]  QUEtiapine (SEROQUEL) 50 MG tablet Place 50 mg into feeding tube every 12 (twelve) hours as needed (increased anxiousness/behavior).    [provider]  vancomycin IVPB Inject 1,000 mg into the vein daily. For seven days Started on 9.5.2020    [provider]    Physical Exam: Vitals:   09/02/19 0630 09/02/19 0700 09/02/19 0715 09/02/19 0800  BP: 111/69 (!) 101/59 110/70 135/77  Pulse: 82   87  Resp: (!) 24 (!) _0 Temp:      TempSrc:      SpO2: (!) 80%   90%     . General:  Acutely and chronically ill; sats down to 83% while I was there and he appeared to have significant secretions that were limiting his oxygenation . Eyes:   EOMI, normal lids,  iris . ENT:  grossly normal hearing (opens eyes to voice), trach in place . Cardiovascular:  RRR, no m/r/g. No LE edema.  Marland Kitchen Respiratory:   Scattered rhonchi with excessive secretions.  Mildly increased respiratory rate. . Abdomen:  soft, NT, ND; PEG tube in place; diffuse ecchymoses all over abdomen from heparin injections . Back:   normal alignment, no CVAT . Skin:  no rash or induration seen on limited exam . Musculoskeletal:  atrophic tone BUE/BLE, good ROM, no bony abnormality . Psychiatric: flat/somnolent mood and affect . Neurologic:  Unable to perform    Radiological Exams on Admission: Dg Chest Portable 1 View  Result Date: 09/02/2019 CLINICAL DATA:  eval for hypoxia. [facility]. Per RN pt is covid +, changes in abgeval for hypoxia EXAM: PORTABLE CHEST 1 VIEW COMPARISON:  07/31/2019 FINDINGS: Stable enlarged cardiac silhouette. Improvement aeration to the RIGHT lung base. Elevation of the LEFT hemidiaphragm LEFT lower lobe poorly evaluated. Improved aeration LEFT lung base. IMPRESSION: 1. Improved aeration to LEFT and RIGHT lung base. 2. Elevated LEFT hemidiaphragm. Electronically Signed   By: Suzy Bouchard M.D.   On: 09/02/2019 04:11    EKG: Independently reviewed.  NSR with rate 96; nonspecific ST changes which appear to indicate repolarization   Labs on Admission: I have personally reviewed the available labs and imaging studies at the time of the admission.  Pertinent labs:   ABG: 7.485/51.2/152.0/38.3 CO2 33 Glucose 111 BUN 27/Creatinine 0.77/GFR >60 Albumin 3.2 CRP <0.8 Lactate 1.0 Procalcitonin <0.10 D-dimer 1.84 Fibrinogen 595 WBC 16.0 Hgb 11.3 UA: many bacteria COVID POSITIVE at facility on 10/7  Assessment/Plan Principal Problem:   Acute on chronic respiratory failure with  hypoxia and hypercapnia (HCC) Active Problems:   Atrial fibrillation (HCC)   Acute respiratory disease due to COVID-19 virus   Respiratory tract infection due  to COVID-19 virus   Chronic systolic CHF (congestive heart failure) ([place])   -Tragic situation where previously healthy patient developed cardiogenic and septic shock, ultimately resulting in trach/PEG dependence -He was discharged to [facility] and family reports is making little progress -He was with VAP 1 month ago and treated with Cefepime and Vanc -He was diagnosed with COVID-19 infection on 10/7 and has had progressive respiratory failure since -CXR actually shows improved aeration from prior -COVID POSITIVE -Based on his underlying conditions, his prognosis is guarded to poor and his family understands this -Pertinent labs concerning for COVID include increased BUN; low procalcitonin; markedly elevated D-dimer (>1) -Will not treat with broad-spectrum antibiotics given procalcitonin <0.1 -Will admit to [facility] ICU for further evaluation, close monitoring, and treatment -At this time, will attempt to avoid use of aerosolized medications and use HFAs instead -Will check daily labs including BMP with Mag, Phos; LFTs; CBC with differential; CRP; ferritin; fibrinogen; D-dimer -Will order steroids (1 mg/kg divided BID) and Remdesivir (pharmacy consult) given +COVID test and hypoxia.  Of note, he is on chronic prednisone 30 mg daily.  -ConsiderTocilizumab 8 mg/kg x1.  Will defer to [facility] doctors regarding this medication since it is being used off label. -Will attempt to maintain euvolemia to a net negative fluid status -He may benefit from prone positioning -With D-dimer <5, will use standard-dosed Lovenox for DVT prevention; he has been on Heparin injections q8h for an extended period and has significant abdominal wall ecchymoses; Lovenox injections are likely to cause less bruising and possibly discomfort -Patient was seen wearing full PPE including: gown, gloves, head cover, N95, and face shield; donning and doffing was in compliance with current standards.  CHF/Afib -Continue home meds including  Lipitor, Coreg, ASA  Depression/anxiety -He is on Klonopin, Lexapro, and Seroquel and these have been continued -Continue MSIR for pain  Goals of care -The family previously expressed dissatisfaction with [facility] and again voiced concern about lack of communication - they said that I am the only physician who has ever called them. -They were quite clear that he would not desire resuscitation -They may be nearing a time when palliative care would be considered, regardless of how he progresses from [place] -For now, aggressive care without heroic efforts    DVT prophylaxis:  Lovenox  Code Status:  DNR - confirmed with family Family Communication: None present; I spoke with the patient's sister and brother-in-law by telephone. Disposition Plan:  To be determined Consults called: PCCM Admission status: Admit - It is my clinical opinion that admission to INPATIENT is reasonable and necessary because of the expectation that this patient will require hospital care that crosses at least 2 midnights to treat this condition based on the medical complexity of the problems presented.  Given the aforementioned information, the predictability of an adverse outcome is felt to be significant.    Total critical care time: 70 minutes Critical care time was exclusive of separately billable procedures and treating other patients. Critical care was necessary to treat or prevent imminent or life-threatening deterioration. Critical care was time spent personally by me on the following activities: development of treatment plan with patient and/or surrogate as well as nursing, discussions with consultants, evaluation of patient's response to treatment, examination of patient, obtaining history from patient or surrogate, ordering and performing treatments and interventions, ordering and review  of laboratory studies, ordering and review of radiographic studies, pulse oximetry and re-evaluation of patient's condition.    Karmen Bongo MD [facility]   How to contact the [facility] Attending or Consulting provider [place] or covering provider during after hours [place], for this patient?  1. Check the care team in [facility] and look for a) attending/consulting TR[facility] provider listed and b) the [facility] team listed 2. Log into [email or web address] and use [place]'s universal password to access. If you do not have the password, please contact the hospital operator. 3. Locate the [facility] provider you are looking for under [facility] and page to a number that you can be directly reached. 4. If you still have difficulty reaching the provider, please page the [facility] (Director on Call) for the Hospitalists listed on amion for assistance.   09/02/2019, 8:37 AM

## 2019-09-03 ENCOUNTER — Inpatient Hospital Stay (HOSPITAL_COMMUNITY): Payer: Medicare Other

## 2019-09-03 DIAGNOSIS — J9622 Acute and chronic respiratory failure with hypercapnia: Secondary | ICD-10-CM | POA: Diagnosis not present

## 2019-09-03 DIAGNOSIS — I5022 Chronic systolic (congestive) heart failure: Secondary | ICD-10-CM | POA: Diagnosis not present

## 2019-09-03 DIAGNOSIS — U071 COVID-19: Secondary | ICD-10-CM | POA: Diagnosis not present

## 2019-09-03 DIAGNOSIS — J9621 Acute and chronic respiratory failure with hypoxia: Secondary | ICD-10-CM | POA: Diagnosis not present

## 2019-09-03 LAB — CBC WITH DIFFERENTIAL/PLATELET
Abs Immature Granulocytes: 0.11 10*3/uL — ABNORMAL HIGH (ref 0.00–0.07)
Basophils Absolute: 0.1 10*3/uL (ref 0.0–0.1)
Basophils Relative: 0 %
Eosinophils Absolute: 0 10*3/uL (ref 0.0–0.5)
Eosinophils Relative: 0 %
HCT: 31.1 % — ABNORMAL LOW (ref 39.0–52.0)
Hemoglobin: 10.1 g/dL — ABNORMAL LOW (ref 13.0–17.0)
Immature Granulocytes: 1 %
Lymphocytes Relative: 8 %
Lymphs Abs: 1.4 10*3/uL (ref 0.7–4.0)
MCH: 31.7 pg (ref 26.0–34.0)
MCHC: 32.5 g/dL (ref 30.0–36.0)
MCV: 97.5 fL (ref 80.0–100.0)
Monocytes Absolute: 1.5 10*3/uL — ABNORMAL HIGH (ref 0.1–1.0)
Monocytes Relative: 8 %
Neutro Abs: 15.7 10*3/uL — ABNORMAL HIGH (ref 1.7–7.7)
Neutrophils Relative %: 83 %
Platelets: 328 10*3/uL (ref 150–400)
RBC: 3.19 MIL/uL — ABNORMAL LOW (ref 4.22–5.81)
RDW: 13.6 % (ref 11.5–15.5)
WBC: 18.7 10*3/uL — ABNORMAL HIGH (ref 4.0–10.5)
nRBC: 0 % (ref 0.0–0.2)

## 2019-09-03 LAB — GLUCOSE, CAPILLARY
Glucose-Capillary: 126 mg/dL — ABNORMAL HIGH (ref 70–99)
Glucose-Capillary: 134 mg/dL — ABNORMAL HIGH (ref 70–99)

## 2019-09-03 LAB — COMPREHENSIVE METABOLIC PANEL
ALT: 26 U/L (ref 0–44)
AST: 23 U/L (ref 15–41)
Albumin: 3.3 g/dL — ABNORMAL LOW (ref 3.5–5.0)
Alkaline Phosphatase: 43 U/L (ref 38–126)
Anion gap: 10 (ref 5–15)
BUN: 37 mg/dL — ABNORMAL HIGH (ref 8–23)
CO2: 32 mmol/L (ref 22–32)
Calcium: 10 mg/dL (ref 8.9–10.3)
Chloride: 96 mmol/L — ABNORMAL LOW (ref 98–111)
Creatinine, Ser: 0.86 mg/dL (ref 0.61–1.24)
GFR calc Af Amer: >60 mL/min (ref >60)
GFR calc non Af Amer: >60 mL/min (ref >60)
Glucose, Bld: 128 mg/dL — ABNORMAL HIGH (ref 70–99)
Potassium: 3.5 mmol/L (ref 3.5–5.1)
Sodium: 138 mmol/L (ref 135–145)
Total Bilirubin: 0.9 mg/dL (ref 0.3–1.2)
Total Protein: 6.4 g/dL — ABNORMAL LOW (ref 6.5–8.1)

## 2019-09-03 LAB — PHOSPHORUS
Phosphorus: 2.9 mg/dL (ref 2.5–4.6)
Phosphorus: 3.3 mg/dL (ref 2.5–4.6)

## 2019-09-03 LAB — PROTIME-INR
INR: 1.1 (ref 0.8–1.2)
Prothrombin Time: 13.8 seconds (ref 11.4–15.2)

## 2019-09-03 LAB — MRSA PCR SCREENING: MRSA by PCR: POSITIVE — AB

## 2019-09-03 LAB — MAGNESIUM
Magnesium: 2 mg/dL (ref 1.7–2.4)
Magnesium: 2 mg/dL (ref 1.7–2.4)

## 2019-09-03 LAB — D-DIMER, QUANTITATIVE: D-Dimer, Quant: 1.17 ug/mL-FEU — ABNORMAL HIGH (ref 0.00–0.50)

## 2019-09-03 LAB — FERRITIN: Ferritin: 122 ng/mL (ref 24–336)

## 2019-09-03 LAB — C-REACTIVE PROTEIN: CRP: <0.8 mg/dL (ref <1.0)

## 2019-09-03 MED ORDER — IOHEXOL 350 MG/ML SOLN
100.0000 mL | Freq: Once | INTRAVENOUS | Status: AC | PRN
  Administered 2019-09-03: 100 mL via INTRAVENOUS

## 2019-09-03 MED ORDER — FENTANYL CITRATE (PF) 100 MCG/2ML IJ SOLN
25.0000 ug | INTRAMUSCULAR | Status: DC | PRN
  Administered 2019-09-03 – 2019-09-04 (×4): 100 ug via INTRAVENOUS
  Administered 2019-09-04: 50 ug via INTRAVENOUS
  Administered 2019-09-06 – 2019-09-13 (×3): 100 ug via INTRAVENOUS
  Filled 2019-09-03 (×8): qty 2

## 2019-09-03 MED ORDER — DEXMEDETOMIDINE HCL IN NACL 400 MCG/100ML IV SOLN
0.0000 ug/kg/h | INTRAVENOUS | Status: AC
  Administered 2019-09-03: 0.3 ug/kg/h via INTRAVENOUS
  Administered 2019-09-03 (×2): 0.4 ug/kg/h via INTRAVENOUS
  Administered 2019-09-04: 0.5 ug/kg/h via INTRAVENOUS
  Filled 2019-09-03 (×4): qty 100

## 2019-09-03 MED ORDER — SODIUM CHLORIDE 3 % IN NEBU
4.0000 mL | INHALATION_SOLUTION | Freq: Two times a day (BID) | RESPIRATORY_TRACT | Status: AC
  Administered 2019-09-03 – 2019-09-06 (×6): 4 mL via RESPIRATORY_TRACT
  Filled 2019-09-03 (×6): qty 4

## 2019-09-03 MED ORDER — VITAL HIGH PROTEIN PO LIQD
1000.0000 mL | ORAL | Status: DC
  Administered 2019-09-03 – 2019-09-06 (×3): 1000 mL
  Filled 2019-09-03: qty 1000

## 2019-09-03 MED ORDER — FENTANYL CITRATE (PF) 100 MCG/2ML IJ SOLN
INTRAMUSCULAR | Status: AC
  Filled 2019-09-03: qty 2

## 2019-09-03 MED ORDER — MIDAZOLAM HCL 2 MG/2ML IJ SOLN
1.0000 mg | INTRAMUSCULAR | Status: DC | PRN
  Administered 2019-09-04 – 2019-09-06 (×4): 1 mg via INTRAVENOUS
  Filled 2019-09-03 (×5): qty 2

## 2019-09-03 MED ORDER — MUPIROCIN 2 % EX OINT
TOPICAL_OINTMENT | Freq: Two times a day (BID) | CUTANEOUS | Status: DC
  Administered 2019-09-03: 1 via NASAL
  Administered 2019-09-04 – 2019-09-05 (×2): via NASAL
  Administered 2019-09-06: 1 via NASAL
  Administered 2019-09-06: 10:00:00 via NASAL
  Administered 2019-09-07: 1 via NASAL
  Administered 2019-09-07 – 2019-09-14 (×14): via NASAL
  Administered 2019-09-14: 1 via NASAL
  Administered 2019-09-15 (×2): via NASAL
  Filled 2019-09-03 (×2): qty 22

## 2019-09-03 MED ORDER — PRO-STAT SUGAR FREE PO LIQD
60.0000 mL | Freq: Three times a day (TID) | ORAL | Status: DC
  Administered 2019-09-03 – 2019-09-06 (×10): 60 mL
  Filled 2019-09-03 (×10): qty 60

## 2019-09-03 MED ORDER — FENTANYL CITRATE (PF) 100 MCG/2ML IJ SOLN
25.0000 ug | INTRAMUSCULAR | Status: AC | PRN
  Administered 2019-09-04 – 2019-09-05 (×3): 25 ug via INTRAVENOUS

## 2019-09-03 MED ORDER — MIDAZOLAM HCL 2 MG/2ML IJ SOLN
1.0000 mg | INTRAMUSCULAR | Status: AC | PRN
  Administered 2019-09-04 (×3): 1 mg via INTRAVENOUS
  Filled 2019-09-03 (×4): qty 2

## 2019-09-03 NOTE — Progress Notes (Signed)
[facility] PCCM  I called his sister Fraser Din to give her an update on his condition.    Roselie Awkward, MD [place] [facility] Pager: [phone] Cell: [phone] If no response, call [phone]

## 2019-09-03 NOTE — Progress Notes (Signed)
Patient transported on vent to CT and returned to [id number] without complications.

## 2019-09-03 NOTE — Progress Notes (Signed)
Initial Nutrition Assessment  RD working remotely.   DOCUMENTATION CODES:   Obesity unspecified  INTERVENTION:  - will adjust TF regimen: Vital High Protein @ 55 ml/hr with 60 ml prostat TID. - this regimen will provide 1920 kcal (125% estimated kcal need), 205 grams protein, and 1103 ml free water.  - free water flush, if desired, to be per MD.   NUTRITION DIAGNOSIS:   Increased nutrient needs related to acute illness(COVID-19) as evidenced by estimated needs.  GOAL:   Provide needs based on ASPEN/SCCM guidelines  MONITOR:   Vent status, TF tolerance, Labs, Weight trends  REASON FOR ASSESSMENT:   Ventilator, Consult Enteral/tube feeding initiation and management  ASSESSMENT:   68 y.o. male with medical history of trach-dependent respiratory failure, PEG tube, CHF, fibromyalgia/chronic pain, and afib presenting with COVID-19 infection. Patient was admitted to [facility] on 7/15 after prolonged hospitalization at [facility] in [place] during which time he required trach and PEG placements. He was recently treated for HVAP at [facility]. His sister preferred code status to be DNR.  Patient remains intubated and has PEG in place from PTA. TF ordered per protocol yesterday: Vital High Protein @ 40 ml/hr with 30 ml prostat BID which provides 1160 kcal, 114 grams protein, and 802 ml free water. Will adjust as outlined above to better meet estimated needs.   Per chart review, current weight is 242 lb and weight on 9/7 was the same. Weight on 02/07/19 was 271 lb. This indicates 29 lb weight loss (10.7% body weight) since that time; not significant for time frame. Patient at increased risk for malnutrition given previous prolonged hospitalization and current illness.  Per notes: - acute on chronic respiratory failure d/t COVID-19 PNA - generalized weakness/deconditioning - due to comorbid illnesses he is a very high risk for death   Patient is currently intubated on ventilator support MV:  12.4 L/min Temp (24hrs), Avg:99 F (37.2 C), Min:98.3 F (36.8 C), Max:99.8 F (37.7 C) Propofol: none  Labs reviewed; CBGs: 134 and 126 mg/dl, Cl: 95 mmol/l, BUN: 27 mg/dl. Medications reviewed; 100 mg colace/day, 55 mg solu-medrol BID, 1 packet miralax/day, 200 mg remdesivir x1 dose 10/10, 100 mg remdesivir/day x4 days starting 10/11.      NUTRITION - FOCUSED PHYSICAL EXAM:  unable to complete while patient is at [place]:   Diet Order            Diet NPO time specified  Diet effective now              EDUCATION NEEDS:   No education needs have been identified at this time  Skin:  Skin Assessment: Reviewed RN Assessment  Last BM:  PTA/unknown  Height:   Ht Readings from Last 1 Encounters:  09/02/19 6\' 2"  (1.88 m)    Weight:   Wt Readings from Last 1 Encounters:  09/03/19 109.9 kg    Ideal Body Weight:  86.4 kg  BMI:  Body mass index is 31.11 kg/m.  Estimated Nutritional Needs:   Kcal:  1210-1540 kcal  Protein:  >/= 200 grams (2.3 grams/kg IBW)  Fluid:  >/= 2 L/day    Jarome Matin, MS, RD, LDN, [facility] Inpatient Clinical Dietitian Pager # [phone] After hours/weekend pager # [phone]

## 2019-09-03 NOTE — Progress Notes (Addendum)
Progress Note   Sergio Mitchell ZHG:[id number] DOB: [birth year] DOA: 09/02/2019  PCP: [name], MD Consultants:  None Patient coming from: [facility]; NOK: [name], [phone] (mobile), [phone] (home)  Chief Complaint: COVID-19 infection  HPI: Sergio Mitchell is a 68 y.o. male with medical history significant of trach-dependent respiratory failure with PEG tube; chronic systolic CHF; and afib presenting with COVID-19 infection.   The patient is a tragic one who appeared to be in reasonably good health other than fibromyalgia and chronic pain until he was admitted at [facility] on 5/25.  He presented with CP and SOB and eventually was diagnosed with cardiogenic shock with EF 20-25%, recommended for AICD.  However, he also developed septic shock from HCAP with BAL positive for H flu.  He required trach/peg and was discharged to [facility].  He also had encephalopathy with anxiety and was treated by psychiatry.  He was admitted to [place] [facility] [place] on 7/15 and has been tolerating some CPAP and T-bar trials.    He was treated recently [facility] for HCAP and was sent to [facility] for evaluation; I consulted on him on 9/7 and he was stable for d/c back to [facility] at that time.  Returned to ED with COVID-19 positive labs and worsening hypoxia.  I spoke with his sister and brother-in-law. They had many excellent questions about treatments and prognosis and understand the severity of his condition.  His sister reports that he would prefer to be DNR at this time. Sergio Mitchell who is trach and Vent dependent from [facility] diagnosed with Covid 19 and sent to the er as they don't keep Covid pt. He seems to be at his baseline.  Review of Systems: unable to perform due to current condition  Assessment/Plan Principal Problem:   Acute on chronic respiratory failure with hypoxia and hypercapnia (HCC) Active Problems:   Atrial fibrillation (HCC)   Acute respiratory disease due to COVID-19 virus   Respiratory tract  infection due to COVID-19 virus   Chronic systolic CHF (congestive heart failure) (HCC)     Acute on chronic respiratory failure with hypoxia and hypercapnia, ongoing - VAP 1 month prior to admission and treated with Cefepime and Vanc - resolved -Worsening hypoxia x2weeks, noted COVID-19 infection on 10/7 -Follow CTA, appears to be unremarkable for PE, left-sided atelectasis and inferior lung appears to be collapsed somewhat chronic appearing due to calcifications Recent Labs    09/02/19 0324 09/03/19 0600  DDIMER 1.84* 1.17*  FERRITIN  --  122  CRP <0.8 <0.8    CHF/Afib, currently rate controlled -Continue home meds including Lipitor, Coreg, ASA -Continue to keep patient euvolemic as possible, on tube feeds currently, follow I's and O's Intake/Output Summary (Last 24 hours) at 09/03/2019 1243 Last data filed at 09/03/2019 0617 Gross per 24 hour  Intake 300 ml  Output 950 ml  Net -650 ml    Questionable acute metabolic encephalopathy, likely secondary to above Depression/anxiety - On Klonopin, Lexapro, and Seroquel via NG - Now on precedex gtt and fent/versed pushes for agitation - patient attempted to pull out trach this morning during rounds and was not able to be oriented   DVT prophylaxis:  Lovenox  Code Status:  DNR - confirmed with family Family Communication:  PCCM contacted Disposition Plan:  To be determined Consults called: PCCM Admission status: Remain inpatient - continues to require ventilator support well above baseline and ongoing treatment for COVID with IV remdesivir, steroids.  Ambulatory Status:  Nonambulatory  Past Medical History:  Diagnosis Date  . DDD (degenerative disc disease), lumbar   . Fibromyositis   . Osteoporosis   . S/P percutaneous endoscopic gastrostomy (PEG) tube placement ([place])   . Scoliosis   . Spinal stenosis   . Systolic CHF, chronic ([place]) 04/2019   EF 20-25%, ACID recommended  . Tracheostomy tube present ([place])   .  Ventilator dependence [facility])     Past Surgical History:  Procedure Laterality Date  . CHOLECYSTECTOMY    . JOINT REPLACEMENT    . REPLACEMENT TOTAL KNEE BILATERAL Bilateral   . TONSILECTOMY, ADENOIDECTOMY, BILATERAL MYRINGOTOMY AND TUBES    . TRACHEOSTOMY      Social History   Socioeconomic History  . Marital status: Married    Spouse name: Not on file  . Number of children: Not on file  . Years of education: Not on file  . Highest education level: Not on file  Occupational History  . Not on file  Social Needs  . Financial resource strain: Not on file  . Food insecurity    Worry: Not on file    Inability: Not on file  . Transportation needs    Medical: Not on file    Non-medical: Not on file  Tobacco Use  . Smoking status: Former Smoker    Packs/day: 1.50    Years: 52.00    Pack years: 78.00    Types: Cigarettes  . Smokeless tobacco: Never Used  Substance and Sexual Activity  . Alcohol use: No  . Drug use: No  . Sexual activity: Not on file  Lifestyle  . Physical activity    Days per week: Patient refused    Minutes per session: Patient refused  . Stress: Not on file  Relationships  . Social Herbalist on phone: Patient refused    Gets together: Patient refused    Attends religious service: Patient refused    Active member of club or organization: Patient refused    Attends meetings of clubs or organizations: Patient refused    Relationship status: Patient refused  . Intimate partner violence    Fear of current or ex partner: Not on file    Emotionally abused: Not on file    Physically abused: Not on file    Forced sexual activity: Not on file  Other Topics Concern  . Not on file  Social History Narrative  . Not on file    Allergies  Allergen Reactions  . Bacitracin Other (See Comments)    Per MAR  . Cefdinir Other (See Comments)    Per MAR  . Lidocaine Other (See Comments)    Per MAR  . Neomycin Other (See Comments)    Per MAR  .  Neosporin Af [Miconazole] Other (See Comments)    Unknown 9.7.2020 Per [facility] MAR pt is currently using Miconazole Topical [place]  . Pantoprazole Other (See Comments)    unknown  . Penicillins Other (See Comments)    Per MAR  . Polymyxin B Other (See Comments)    Per MAR  . Pramoxine Other (See Comments)    Per MAR    Family History  Problem Relation Age of Onset  . Scoliosis Mother   . Fibromyalgia Mother   . Prostate cancer Father     Prior to Admission medications   Medication Sig Start Date End Date Taking? Authorizing Provider  acetaminophen (TYLENOL) 325 MG tablet Place 650 mg into feeding tube every 6 (six) hours as needed for mild pain, fever  or headache.    [provider]  albuterol (PROVENTIL) (2.5 MG/3ML) 0.083% nebulizer solution Take 2.5 mg by nebulization every 2 (two) hours as needed for wheezing or shortness of breath.    [provider]  aspirin 81 MG chewable tablet Place 81 mg into feeding tube daily.    [provider]  atorvastatin (LIPITOR) 20 MG tablet Place 20 mg into feeding tube daily.    [provider]  carvedilol (COREG) 12.5 MG tablet Place 12.5 mg into feeding tube 2 (two) times daily.  07/23/16   [provider]  ceFEPIme 1 g in sodium chloride 0.9 % 100 mL Inject 1 g into the vein every 8 (eight) hours. For seven days Started on 9.4.2020    [provider]  chlorhexidine gluconate, MEDLINE KIT, (PERIDEX) 0.12 % solution Use as directed 15 mLs in the mouth or throat 3 (three) times daily.    [provider]  clonazePAM (KLONOPIN) 1 MG tablet Place 1 mg into feeding tube every 8 (eight) hours.    [provider]  docusate sodium (COLACE) 100 MG capsule 100 mg daily. Per tube    [provider]  escitalopram (LEXAPRO) 10 MG tablet Place 10 mg into feeding tube daily.    [provider]  furosemide (LASIX) 40 MG tablet Place 40 mg into feeding tube daily.     [provider]  heparin 5000 UNIT/ML injection Inject 5,000 Units into the skin every 8 (eight) hours.    [provider]  ipratropium-albuterol (DUONEB) 0.5-2.5 (3) MG/3ML SOLN Take 3 mLs by nebulization every 6 (six) hours.    [provider]  lansoprazole (PREVACID) 30 MG capsule Place 30 mg into feeding tube daily at 12 noon.    [provider]  miconazole (ANTIFUNGAL) 2 % powder Apply 1 application topically daily.    [provider]  morphine (MSIR) 15 MG tablet Place 15 mg into feeding tube every 8 (eight) hours.    [provider]  Nutritional Supplements (ISOSOURCE 1.5 CAL PO) Give by tube.    [provider]  ondansetron (ZOFRAN) 4 MG tablet Place 4 mg into feeding tube every 8 (eight) hours as needed for nausea or vomiting.    [provider]  polyethylene glycol (MIRALAX) 17 g packet Place 17 g into feeding tube daily.    [provider]  predniSONE (DELTASONE) 10 MG tablet Place 30 mg into feeding tube daily with breakfast.    [provider]  QUEtiapine (SEROQUEL) 50 MG tablet Place 50 mg into feeding tube every 12 (twelve) hours as needed (increased anxiousness/behavior).    [provider]  vancomycin IVPB Inject 1,000 mg into the vein daily. For seven days Started on 9.5.2020    [provider]    Physical Exam: Vitals:   09/03/19 0800 09/03/19 0818 09/03/19 1028 09/03/19 1105  BP: 110/65 110/65 123/72 118/67  Pulse: 67 65 82 82  Resp: (!) 24 (!) 24  (!) 26  Temp: 98.3 F (36.8 C)     TempSrc: Axillary     SpO2: (!) 89% 90%  (!) 87%  Weight:      Height:  _0  (1.88 m)       General:  Chronically ill appearing obese, resting comfortably in no acute distress Eyes:   EOMI, normal lids, iris ENT:  grossly normal hearing (opens eyes to voice), trach in place Cardiovascular:  RRR, no m/r/g. No LE edema.  Respiratory:  Scattered rhonchi with excessive secretions.   Mildly increased respiratory rate. Abdomen: Soft, NT, ND; PEG tube in place; diffuse ecchymoses over Skin:  no rash or induration seen on limited exam Musculoskeletal:  atrophic tone BUE/BLE, good ROM, no bony abnormality   Radiological Exams on Admission: Dg Chest Portable 1 View  Result Date: 09/02/2019 CLINICAL DATA:  eval for hypoxia. [facility]. Per RN pt is covid +, changes in abgeval for hypoxia EXAM: PORTABLE CHEST 1 VIEW COMPARISON:  07/31/2019 FINDINGS: Stable enlarged cardiac silhouette. Improvement aeration to the RIGHT lung base. Elevation of the LEFT hemidiaphragm LEFT lower lobe poorly evaluated. Improved aeration LEFT lung base. IMPRESSION: 1. Improved aeration to LEFT and RIGHT lung base. 2. Elevated LEFT hemidiaphragm. Electronically Signed   By: Suzy Bouchard M.D.   On: 09/02/2019 04:11   EKG: Independently reviewed.  NSR with rate 96; nonspecific ST changes which appear to indicate repolarization  Total time: 30 minutes  Little Ishikawa DO [facility]  09/03/2019, 12:43 PM

## 2019-09-03 NOTE — TOC Initial Note (Signed)
Transition of Care [facility]) - Initial/Assessment Note    Patient Details  Name: Sergio Mitchell MRN: [id number] Date of Birth: [birth year]  [facility] CM/SW Contact:    Ninfa Meeker, RN Phone Number: 09/03/2019, 2:37 PM  Clinical Narrative: Patient is 68 yr old male admitted from [place]  He returned to ED with COVID-19 and hypoxia.  He is unable to provide history. Patient is trached and vent dependent. [facility] does not accept COVID positive patients.  CM/SW will continue to monitor for appropriate disposition. May He be blessed to recover.    Patient Goals and CMS Choice        Expected Discharge Plan and Services                                                Prior Living Arrangements/Services                       Activities of Daily Living Home Assistive Devices/Equipment: Other (Comment)(From LTAC facility) ADL Screening (condition at time of admission) Patient's cognitive ability adequate to safely complete daily activities?: Yes Is the patient deaf or have difficulty hearing?: No Does the patient have difficulty seeing, even when wearing glasses/contacts?: No Does the patient have difficulty concentrating, remembering, or making decisions?: No Patient able to express need for assistance with ADLs?: Yes Does the patient have difficulty dressing or bathing?: Yes Independently performs ADLs?: No Bathing: Dependent Is this a change from baseline?: Pre-admission baseline Toileting: Needs assistance Is this a change from baseline?: Pre-admission baseline In/Out Bed: Dependent Is this a change from baseline?: Pre-admission baseline Does the patient have difficulty walking or climbing stairs?: No Weakness of Legs: None Weakness of Arms/Hands: None  Permission Sought/Granted                  Emotional Assessment              Admission diagnosis:  COVID-19 [U07.1] Patient Active Problem List   Diagnosis Date Noted   . Acute on chronic respiratory failure with hypoxia and hypercapnia ([place]) 09/02/2019  . Acute respiratory disease due to COVID-19 virus 09/02/2019  . Respiratory tract infection due to COVID-19 virus 09/02/2019  . Chronic systolic CHF (congestive heart failure) ([place]) 09/02/2019  . VABP (ventilator-associated bacterial pneumonia) ([place]) 07/31/2019  . Acute respiratory failure with hypoxia and hypercarbia (HCC)   . Chronic respiratory failure requiring continuous mechanical ventilation through tracheostomy ([place])   . HCAP (healthcare-associated pneumonia)   . Other insomnia 04/20/2017  . Other fatigue 04/20/2017  . Fibromyalgia 10/06/2016  . Primary osteoarthritis of both hands 10/06/2016  . 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 DDDlumbar spine 10/06/2016  . Former smoker 10/06/2016  . Depression 10/06/2016  . Anxiety 10/06/2016  . Atrial fibrillation ([place]) 10/06/2016  . Gastroesophageal reflux disease without esophagitis 10/06/2016   PCP:  [name], MD Pharmacy:  No Pharmacies Listed    Social Determinants of Health (SDOH) Interventions    Readmission Risk Interventions No flowsheet data found.

## 2019-09-03 NOTE — Progress Notes (Signed)
S/w pt's sister Fraser Din [phone]) ~1030; updated.  She asked for MD to call. Pt pulling on tubes/lines; upon return from huddle ~1010 pt had taken off trach ties and was pulling on trach; dislodged ~2"; called RT & they were able to reinsert, MD ordered sedation and restraints.  ~ 1400 untied restraints to allow pt to write on white board, he attempted to pull out left wrist PIV.  ~1600 Lab came to draw blood and pt began kicking and pulling leads off.   F/C dc'd 1815.

## 2019-09-03 NOTE — Progress Notes (Signed)
Spoke with sister Fraser Din. Discussed ventilator, O2 sats, and medications. Appreciative of  Call.

## 2019-09-03 NOTE — Progress Notes (Signed)
NAME:  Sergio Mitchell, MRN:  [id number], DOB:  04/16/1951, LOS: 1 ADMISSION DATE:  09/02/2019, CONSULTATION DATE:  10/10 REFERRING MD:  Lorin Mercy, CHIEF COMPLAINT:  Hypoxemia   Brief History   68 y/o male admitted from [place] to [facility] on 10/10 for hypoxemia due to COVID 19 pneumonia.  At baseline the patient has ventilator dependent respiratory failure and systolic heart failure.    Past Medical History  Chronic respiratory failure with hypoxemia, ventilator dependent Tracheostomy status Chronic systolic heart faiulre Fibromyalgia Chronic pain Osteoporosis Scoliosis Spinal stenosis  Significant Hospital Events   10/10 admission 10/11 worsening confusion, hypoxemia  Consults:  PCCM  Procedures:  Tracheostomy, chronic (May 2020?)  Significant Diagnostic Tests:    Micro Data:  10/7 SARS COV 2 Positive [facility])  Antimicrobials/COVID Treatment  10/10 Convalescent plasma > 10/10 decadron >  10/10 remdesivir >    Interim history/subjective:   Worsening hypoxemia, confusion overnight  Objective   Blood pressure 102/64, pulse 69, temperature 98.3 F (36.8 C), resp. rate (!) 24, height 6\' 2"  (1.88 m), weight 109.9 kg, SpO2 (!) 88 %.    Vent Mode: PRVC FiO2 (%):  [50 %-100 %] 100 % Set Rate:  [24 bmp] 24 bmp Vt Set:  [500 mL] 500 mL PEEP:  [5 cmH20-8 cmH20] 8 cmH20 Plateau Pressure:  [16 cmH20-29 cmH20] 21 cmH20   Intake/Output Summary (Last 24 hours) at 09/03/2019 0800 Last data filed at 09/03/2019 8341 Gross per 24 hour  Intake 550 ml  Output 950 ml  Net -400 ml   Filed Weights   09/02/19 1700 09/03/19 0500  Weight: 110.2 kg 109.9 kg    Examination:  General:  In bed on vent HENT: NCAT tracheostomy in place PULM: Some wheezing bilaterally, vent supported breathing CV: RRR, no mgr GI: BS+, soft, nontender MSK: normal bulk and tone Neuro: awake, following commands on ventilator   10/10 CXR images personally reviewed: tracheostomy in place, no  real infiltrate noted, some plate-like atelectasis R base  Resolved Hospital Problem list     Assessment & Plan:  Acute on chronic respiratory failure with hypoxemia due to COVID 19 pneumonia, generalized weakness, chronic systolic heart failure (not clearly ARDS): worsening hypoxemia on 10/11 Continue full support on mechanical ventilation CT angiogram chest today to evaluate for pulmonary embolism and pulmonary parenchyma Send respiratory culture now Continue decadron, remdesivir Start sedation given confusion, pulling on tracheostomy today May need ARDS protocol, hold off for now until can assess appearance of pulmonary parenchyma on CT  Chronic systolic heart failure Tele Continue coreg Monitor I/O and hemodynamics  Physical deconditioning PT/OT consult as able  Best practice:  Diet: tube feeding, allow to take food by mouth as well Pain/Anxiety/Delirium protocol (if indicated): no VAP protocol (if indicated): yes DVT prophylaxis: Lovenox low dose per COVID PACT Trial GI prophylaxis: lansoprazole for stress ulcer prophylaxis Glucose control: SSI Mobility: bed rest Code Status: DNR Family Communication: per [facility] Disposition: remain in ICU  Labs   CBC: Recent Labs  Lab 09/02/19 0234 09/02/19 0301  WBC  --  16.0*  NEUTROABS  --  12.4*  HGB 11.9* 11.3*  HCT 35.0* 34.9*  MCV  --  100.0  PLT  --  962    Basic Metabolic Panel: Recent Labs  Lab 09/02/19 0234 09/02/19 0301  NA 138 140  K 3.8 3.7  CL  --  95*  CO2  --  33*  GLUCOSE  --  111*  BUN  --  27*  CREATININE  --  0.77  CALCIUM  --  10.2   GFR: Estimated Creatinine Clearance: 115 mL/min (by C-G formula based on SCr of 0.77 mg/dL). Recent Labs  Lab 09/02/19 0301 09/02/19 0324  PROCALCITON  --  <0.10  WBC 16.0*  --   LATICACIDVEN 1.0  --     Liver Function Tests: Recent Labs  Lab 09/02/19 0301  AST 23  ALT 25  ALKPHOS 43  BILITOT 0.3  PROT 6.9  ALBUMIN 3.2*   No results for input(s):  LIPASE, AMYLASE in the last 168 hours. No results for input(s): AMMONIA in the last 168 hours.  ABG    Component Value Date/Time   PHART 7.485 (H) 09/02/2019 0234   PCO2ART 51.2 (H) 09/02/2019 0234   PO2ART 152.0 (H) 09/02/2019 0234   HCO3 38.3 (H) 09/02/2019 0234   TCO2 40 (H) 09/02/2019 0234   O2SAT 99.0 09/02/2019 0234     Coagulation Profile: No results for input(s): INR, PROTIME in the last 168 hours.  Cardiac Enzymes: No results for input(s): CKTOTAL, CKMB, CKMBINDEX, TROPONINI in the last 168 hours.  HbA1C: No results found for: HGBA1C  CBG: Recent Labs  Lab 09/02/19 2004 09/02/19 2357 09/03/19 0400  GLUCAP 109* 134* 126*       Critical care time: 35 minutes     Heber [place], MD [place] PCCM Pager: [phone] Cell: [phone] If no response, call [phone]

## 2019-09-04 DIAGNOSIS — J9621 Acute and chronic respiratory failure with hypoxia: Secondary | ICD-10-CM | POA: Diagnosis not present

## 2019-09-04 DIAGNOSIS — U071 COVID-19: Secondary | ICD-10-CM | POA: Diagnosis not present

## 2019-09-04 DIAGNOSIS — J9622 Acute and chronic respiratory failure with hypercapnia: Secondary | ICD-10-CM | POA: Diagnosis not present

## 2019-09-04 DIAGNOSIS — J988 Other specified respiratory disorders: Secondary | ICD-10-CM

## 2019-09-04 DIAGNOSIS — I5022 Chronic systolic (congestive) heart failure: Secondary | ICD-10-CM | POA: Diagnosis not present

## 2019-09-04 DIAGNOSIS — I4891 Unspecified atrial fibrillation: Secondary | ICD-10-CM | POA: Diagnosis not present

## 2019-09-04 LAB — C-REACTIVE PROTEIN: CRP: <0.8 mg/dL (ref <1.0)

## 2019-09-04 LAB — COMPREHENSIVE METABOLIC PANEL
ALT: 22 U/L (ref 0–44)
AST: 18 U/L (ref 15–41)
Albumin: 3 g/dL — ABNORMAL LOW (ref 3.5–5.0)
Alkaline Phosphatase: 45 U/L (ref 38–126)
Anion gap: 12 (ref 5–15)
BUN: 40 mg/dL — ABNORMAL HIGH (ref 8–23)
CO2: 30 mmol/L (ref 22–32)
Calcium: 10.2 mg/dL (ref 8.9–10.3)
Chloride: 97 mmol/L — ABNORMAL LOW (ref 98–111)
Creatinine, Ser: 0.8 mg/dL (ref 0.61–1.24)
GFR calc Af Amer: >60 mL/min (ref >60)
GFR calc non Af Amer: >60 mL/min (ref >60)
Glucose, Bld: 150 mg/dL — ABNORMAL HIGH (ref 70–99)
Potassium: 3.4 mmol/L — ABNORMAL LOW (ref 3.5–5.1)
Sodium: 139 mmol/L (ref 135–145)
Total Bilirubin: 0.3 mg/dL (ref 0.3–1.2)
Total Protein: 6.4 g/dL — ABNORMAL LOW (ref 6.5–8.1)

## 2019-09-04 LAB — CBC WITH DIFFERENTIAL/PLATELET
Abs Immature Granulocytes: 0.19 10*3/uL — ABNORMAL HIGH (ref 0.00–0.07)
Basophils Absolute: 0 10*3/uL (ref 0.0–0.1)
Basophils Relative: 0 %
Eosinophils Absolute: 0 10*3/uL (ref 0.0–0.5)
Eosinophils Relative: 0 %
HCT: 32.4 % — ABNORMAL LOW (ref 39.0–52.0)
Hemoglobin: 10.7 g/dL — ABNORMAL LOW (ref 13.0–17.0)
Immature Granulocytes: 1 %
Lymphocytes Relative: 4 %
Lymphs Abs: 1.1 10*3/uL (ref 0.7–4.0)
MCH: 31.7 pg (ref 26.0–34.0)
MCHC: 33 g/dL (ref 30.0–36.0)
MCV: 95.9 fL (ref 80.0–100.0)
Monocytes Absolute: 1.1 10*3/uL — ABNORMAL HIGH (ref 0.1–1.0)
Monocytes Relative: 5 %
Neutro Abs: 21.6 10*3/uL — ABNORMAL HIGH (ref 1.7–7.7)
Neutrophils Relative %: 90 %
Platelets: 346 10*3/uL (ref 150–400)
RBC: 3.38 MIL/uL — ABNORMAL LOW (ref 4.22–5.81)
RDW: 13.6 % (ref 11.5–15.5)
WBC: 24 10*3/uL — ABNORMAL HIGH (ref 4.0–10.5)
nRBC: 0 % (ref 0.0–0.2)

## 2019-09-04 LAB — GLUCOSE, CAPILLARY
Glucose-Capillary: 138 mg/dL — ABNORMAL HIGH (ref 70–99)
Glucose-Capillary: 136 mg/dL — ABNORMAL HIGH (ref 70–99)
Glucose-Capillary: 146 mg/dL — ABNORMAL HIGH (ref 70–99)
Glucose-Capillary: 124 mg/dL — ABNORMAL HIGH (ref 70–99)
Glucose-Capillary: 154 mg/dL — ABNORMAL HIGH (ref 70–99)
Glucose-Capillary: 117 mg/dL — ABNORMAL HIGH (ref 70–99)

## 2019-09-04 LAB — PREPARE FRESH FROZEN PLASMA: Unit division: 0

## 2019-09-04 LAB — BPAM FFP
Blood Product Expiration Date: 202010112120
ISSUE DATE / TIME: 202010102217
Unit Type and Rh: 6200

## 2019-09-04 LAB — MAGNESIUM: Magnesium: 2 mg/dL (ref 1.7–2.4)

## 2019-09-04 LAB — PROTIME-INR
INR: 1.1 (ref 0.8–1.2)
Prothrombin Time: 14.1 seconds (ref 11.4–15.2)

## 2019-09-04 LAB — PHOSPHORUS: Phosphorus: 3 mg/dL (ref 2.5–4.6)

## 2019-09-04 LAB — D-DIMER, QUANTITATIVE: D-Dimer, Quant: 1.33 ug/mL-FEU — ABNORMAL HIGH (ref 0.00–0.50)

## 2019-09-04 LAB — FERRITIN: Ferritin: 109 ng/mL (ref 24–336)

## 2019-09-04 MED ORDER — FAMOTIDINE 20 MG PO TABS
20.0000 mg | ORAL_TABLET | Freq: Every day | ORAL | Status: DC
  Administered 2019-09-05 – 2019-09-12 (×8): 20 mg via ORAL
  Filled 2019-09-04 (×8): qty 1

## 2019-09-04 MED ORDER — ORAL CARE MOUTH RINSE
15.0000 mL | OROMUCOSAL | Status: DC
  Administered 2019-09-04 – 2019-09-05 (×10): 15 mL via OROMUCOSAL

## 2019-09-04 MED ORDER — CHLORHEXIDINE GLUCONATE 0.12% ORAL RINSE (MEDLINE KIT)
15.0000 mL | Freq: Two times a day (BID) | OROMUCOSAL | Status: DC
  Administered 2019-09-04 – 2019-09-07 (×6): 15 mL via OROMUCOSAL

## 2019-09-04 MED ORDER — FAMOTIDINE 20 MG PO TABS: 20.0000 mg | ORAL_TABLET | Freq: Every day | ORAL | Status: DC

## 2019-09-04 MED ORDER — POTASSIUM CHLORIDE 20 MEQ/15ML (10%) PO SOLN
40.0000 meq | Freq: Every day | ORAL | Status: DC
  Administered 2019-09-04: 15:00:00 40 meq
  Filled 2019-09-04 (×2): qty 30

## 2019-09-04 NOTE — Progress Notes (Signed)
NAME:  Sergio Mitchell, MRN:  [id number], DOB:  [birth year], LOS: 2 ADMISSION DATE:  09/02/2019, CONSULTATION DATE:  10/10 REFERRING MD:  Lorin Mercy, CHIEF COMPLAINT:  Hypoxemia   Brief History   68 y/o male admitted from [place] to [facility] on 10/10 for hypoxemia due to COVID 19 pneumonia.  At baseline the patient has ventilator dependent respiratory failure and systolic heart failure.   History of present illness   69 y/o male with multiple medical problems including systolic heart failure, chronic respiratory failure wth hypoxemia.  At baseline the patient had minimal medical problems prior to 03/2019 when he was admitted to a hospital in [place] with cardiogenic shock, LVEF noted to be 20-25%.  During his hospitalization he required mechanical ventilation, eventually a tracheostomy, and developed septic shock.  He was eventually discharged to [facility] in mid July 2020 where he remained ventilator dependent but was able to perform some ventilator weaning.  He was noted to have COVID on 10/7 and has developed hypoxemia since.  He was brought to the [facility] ER and transferred to [facility].  The patient could provide no history.  Past Medical History  Chronic respiratory failure with hypoxemia, ventilator dependent Tracheostomy status Chronic systolic heart faiulre Fibromyalgia Chronic pain Osteoporosis Scoliosis Spinal stenosis  Significant Hospital Events   10/10 admission  Consults:  PCCM  Procedures:  Tracheostomy, chronic (May 2020?)  Significant Diagnostic Tests:    Micro Data:  10/7 SARS COV 2 Positive [facility])  Antimicrobials/COVID Treatment  10/10 Convalescent plasma > 10/10 decadron >  10/10 remdesivir >    Interim history/subjective:  No issues overnight.  Remains in intensive care unit.  Connected to mechanical support.  Tracheostomy tube in place.  Objective   Blood pressure 124/72, pulse (!) 55, temperature 98.2 F (36.8 C), temperature source Axillary,  resp. rate (!) 24, height '6\' 2"'  (1.88 m), weight 109.9 kg, SpO2 96 %.    Vent Mode: PRVC FiO2 (%):  [80 %] 80 % Set Rate:  [24 bmp] 24 bmp Vt Set:  [500 mL] 500 mL PEEP:  [8 cmH20] 8 cmH20 Plateau Pressure:  [16 cmH20-27 cmH20] 18 cmH20   Intake/Output Summary (Last 24 hours) at 09/04/2019 0804 Last data filed at 09/04/2019 0600 Gross per 24 hour  Intake 1830.2 ml  Output 1050 ml  Net 780.2 ml   Filed Weights   09/02/19 1700 09/03/19 0500  Weight: 110.2 kg 109.9 kg    Examination:  General: Resting in bed connected to mechanical ventilation, tracheostomy tube in place HENT: NCAT, trach in place, clean dry and intact no significant secretions PULM: Bilateral ventilated breath sounds no wheeze CV: Regular rate and rhythm, S1-S2 GI: Soft nontender nondistended MSK: Normal bulk and tone Neuro: Awake alert following commands no focal deficit  10/10 CXR images personally reviewed: tracheostomy in place, no real infiltrate noted, some plate-like atelectasis R base  10/11: CTA chest No evidence of pulmonary embolus.  Lower lobe atelectasis volume loss. Multifocal groundglass opacities.  Consistent with COVID pneumonia. The patient's images have been independently reviewed by me.    Resolved Hospital Problem list     Assessment & Plan:   Acute on chronic respiratory failure with hypoxemia due to COVID 19 pneumonia, generalized weakness, chronic systolic heart failure Remains in the intensive care unit for close hemodynamic respiratory support. Continue full mechanical ventilator support currently in PRVC.  Tolerating well. Continue routine tracheostomy care Pulmonary toileting Decadron remdesivir  Wean mechanical vent support as tolerated as permitted  Continue low tidal volume ventilation strategy  Chronic systolic heart failure Continue home dose Coreg Monitor I's and O's Diuresis to maintain euvolemia  Physical deconditioning PT OT to work with patient as  tolerated.  Rising leukocytosis Continue to observe, possibly steroid related  Best practice:  Diet: Continue tube feeding and allowed to take food by mouth.  Advance diet as tolerated Pain/Anxiety/Delirium protocol (if indicated): no VAP protocol (if indicated): yes DVT prophylaxis: interested in [place] trial, for now use lovenox 0.33m/kg sub cutaneous q12h per COVID ICU protocol GI prophylaxis: lansoprazole for stress ulcer prophylaxis Glucose control: SSI Mobility: bed rest Code Status: DNR [facility]   [facility]abs   CBC: Recent Labs  Lab 09/02/19 0234 09/02/19 0301 09/03/19 0600 09/04/19 0530  WBC  --  16.0* 18.7* 24.0*  NEUTROABS  --  12.4* 15.7* 21.6*  HGB 11.9* 11.3* 10.1* 10.7*  HCT 35.0* 34.9* 31.1* 32.4*  MCV  --  100.0 97.5 95.9  PLT  --  362 328 3073   Basic Metabolic Panel: Recent Labs  Lab 09/02/19 0234 09/02/19 0301 09/03/19 0600 09/03/19 1700 09/04/19 0530  NA 138 140 138  --  139  K 3.8 3.7 3.5  --  3.4*  CL  --  95* 96*  --  97*  CO2  --  33* 32  --  30  GLUCOSE  --  111* 128*  --  150*  BUN  --  27* 37*  --  40*  CREATININE  --  0.77 0.86  --  0.80  CALCIUM  --  10.2 10.0  --  10.2  MG  --   --  2.0 2.0 2.0  PHOS  --   --  2.9 3.3 3.0   GFR: Estimated Creatinine Clearance: 115 mL/min (by C-G formula based on SCr of 0.8 mg/dL). Recent Labs  Lab 09/02/19 0301 09/02/19 0324 09/03/19 0600 09/04/19 0530  PROCALCITON  --  <0.10  --   --   WBC 16.0*  --  18.7* 24.0*  LATICACIDVEN 1.0  --   --   --     Liver Function Tests: Recent Labs  Lab 09/02/19 0301 09/03/19 0600 09/04/19 0530  AST '23 23 18  ' ALT '25 26 22  ' ALKPHOS 43 43 45  BILITOT 0.3 0.9 0.3  PROT 6.9 6.4* 6.4*  ALBUMIN 3.2* 3.3* 3.0*   No results for input(s): LIPASE, AMYLASE in the last 168 hours. No results for input(s): AMMONIA in the last 168 hours.  ABG    Component Value Date/Time   PHART 7.485 (H) 09/02/2019 0234   PCO2ART 51.2  (H) 09/02/2019 0234   PO2ART 152.0 (H) 09/02/2019 0234   HCO3 38.3 (H) 09/02/2019 0234   TCO2 40 (H) 09/02/2019 0234   O2SAT 99.0 09/02/2019 0234     Coagulation Profile: Recent Labs  Lab 09/03/19 0600 09/04/19 0530  INR 1.1 1.1    Cardiac Enzymes: No results for input(s): CKTOTAL, CKMB, CKMBINDEX, TROPONINI in the last 168 hours.  HbA1C: No results found for: HGBA1C  CBG: Recent Labs  Lab 09/02/19 2004 09/02/19 2357 09/03/19 0400 09/04/19 0142 09/04/19 0502  GLUCAP 109* 134* 126* 138* 136*    Review of Systems:   Cannot obtain due to chronic trach, inability to talk  Past Medical History  He,  has a past medical history of DDD (degenerative disc disease), lumbar, Fibromyositis, Osteoporosis, S/P percutaneous [place] gastrostomy (PEG) [place], Scoliosis, Spinal stenosis, Systolic CHF, chronic ([place] (  04/2019), Tracheostomy tube present ([place]), and Ventilator dependence ([place]).   Surgical History    Past Surgical History:  Procedure Laterality Date  . CHOLECYSTECTOMY    . JOINT REPLACEMENT    . REPLACEMENT TOTAL KNEE BILATERAL Bilateral   . TONSILECTOMY, ADENOIDECTOMY, BILATERAL MYRINGOTOMY AND TUBES    . TRACHEOSTOMY       Social History   reports that he has quit smoking. His smoking use included cigarettes. He has a 78.00 pack-year smoking history. He has never used smokeless tobacco. He reports that he does not drink alcohol or use drugs.   Family History   His family history includes Fibromyalgia in his mother; Prostate cancer in his father; Scoliosis in his mother.   Allergies Allergies  Allergen Reactions  . Bacitracin Other (See Comments)    Per MAR  . Cefdinir Other (See Comments)    Per MAR  . Lidocaine Other (See Comments)    Per MAR  . Neomycin Other (See Comments)    Per MAR  . Neosporin Af [Miconazole] Other (See Comments)    Unknown 9.7.2020 Per [facility] MAR pt is currently using Miconazole Topical Poweder  . Pantoprazole  Other (See Comments)    unknown  . Penicillins Other (See Comments)    Per MAR  . Polymyxin B Other (See Comments)    Per MAR  . Pramoxine Other (See Comments)    Per MAR     Home Medications  Prior to Admission medications   Medication Sig Start Date End Date Taking? Authorizing Provider  acetaminophen (TYLENOL) 325 MG tablet Place 650 mg into feeding tube every 6 (six) hours as needed for mild pain, fever or headache.   Yes [provider]  albuterol (PROVENTIL) (2.5 MG/3ML) 0.083% nebulizer solution Take 2.5 mg by nebulization every 2 (two) hours as needed for wheezing or shortness of breath.   Yes [provider]  aspirin 81 MG chewable tablet Place 81 mg into feeding tube daily.   Yes [provider]  atorvastatin (LIPITOR) 20 MG tablet Place 20 mg into feeding tube daily.   Yes [provider]  carvedilol (COREG) 12.5 MG tablet Place 12.5 mg into feeding tube 2 (two) times daily.  07/23/16  Yes [provider]  chlorhexidine gluconate, MEDLINE KIT, (PERIDEX) 0.12 % solution Use as directed 15 mLs in the mouth or throat 3 (three) times daily.   Yes [provider]  Cholecalciferol (VITAMIN D3) 125 MCG (5000 UT) CAPS Take 5,000 Units by mouth daily.   Yes [provider]  clonazePAM (KLONOPIN) 1 MG tablet Place 1 mg into feeding tube every 8 (eight) hours.   Yes [provider]  docusate sodium (COLACE) 100 MG capsule Take 200 mg by mouth daily. Per tube    Yes [provider]  escitalopram (LEXAPRO) 10 MG tablet Place 10 mg into feeding tube daily.   Yes [provider]  furosemide (LASIX) 40 MG tablet Place 40 mg into feeding tube daily.   Yes [provider]  heparin 5000 UNIT/ML injection Inject 5,000 Units into the skin every 8 (eight) hours.   Yes [provider]  ipratropium-albuterol (DUONEB) 0.5-2.5 (3) MG/3ML SOLN Take 3 mLs by nebulization every 6 (six) hours.   Yes  [provider]  lansoprazole (PREVACID) 30 MG capsule Place 30 mg into feeding tube daily at 12 noon.   Yes [provider]  miconazole (ANTIFUNGAL) 2 % powder Apply 1 application topically daily.   Yes [provider]  morphine (MSIR) 15 MG tablet Place 15 mg into feeding tube every 8 (eight) hours.   Yes [provider]  Nutritional Supplements (ISOSOURCE 1.5 CAL) LIQD Take 60 mLs by mouth 3 (three) times daily.   Yes [provider]  ondansetron (ZOFRAN) 4 MG tablet Place 4 mg into feeding tube every 8 (eight) hours as needed for nausea or vomiting.   Yes [provider]  polyethylene glycol (MIRALAX) 17 g packet Place 17 g into feeding tube daily.   Yes [provider]  predniSONE (DELTASONE) 10 MG tablet Place 30 mg into feeding tube daily with breakfast.   Yes [provider]  vitamin C (ASCORBIC ACID) 500 MG tablet Take 500 mg by mouth daily.   Yes [provider]  zinc gluconate 50 MG tablet Take 50 mg by mouth daily.   Yes [provider]  QUEtiapine (SEROQUEL) 50 MG tablet Place 50 mg into feeding tube every 12 (twelve) hours as needed (increased anxiousness/behavior).    [provider]      This patient is critically ill with multiple organ system failure; which, requires frequent high complexity decision making, assessment, support, evaluation, and titration of therapies. This was completed through the application of advanced monitoring technologies and extensive interpretation of multiple databases. During this encounter critical care time was devoted to patient care services described in this note for 33 minutes.  Garner Nash, DO [facility] 09/04/2019 8:04 AM  Personal pager: [phone] If unanswered, please page CCM On-call: [phone]

## 2019-09-04 NOTE — Progress Notes (Signed)
Updated pt's sister and brother-in-law via phone. All questions answered. Sister requested that pt not return to [facility] at discharge, case management consult ordered.

## 2019-09-04 NOTE — Progress Notes (Signed)
I spoke to pt's sister and brother in law to give updates and answer any questions. They were interested in his vent settings and his ability to communicate, which I answered for them. They indicated that they may try to call later to set up a video chat. While the pt is not completely oriented, he has mentioned a wife multiple times and asked that I contact her. He is unable to provide a name or phone number, and the only contact listed is his sister, Fraser Din. Interestingly, his marital status in the documentation is "married."

## 2019-09-04 NOTE — Progress Notes (Signed)
Progress Note   Sergio Mitchell YKZ:[id number] DOB: [birth year] DOA: 09/02/2019  PCP: [name], MD Consultants:  None Patient coming from: [facility]; NOK: Sergio Mitchell, [phone] (mobile), [phone] (home)  Chief Complaint: COVID-19 infection  HPI: Sergio Mitchell is a 68 y.o. male with medical history significant of trach-dependent respiratory failure with PEG tube; chronic systolic CHF; and afib presenting with COVID-19 infection.   The patient is a tragic one who appeared to be in reasonably good health other than fibromyalgia and chronic pain until he was admitted at [facility] on 5/25.  He presented with CP and SOB and eventually was diagnosed with cardiogenic shock with EF 20-25%, recommended for AICD.  However, he also developed septic shock from HCAP with BAL positive for H flu.  He required trach/peg and was discharged to [facility].  He also had encephalopathy with anxiety and was treated by psychiatry.  He was admitted to [facility] on 7/15 and has been tolerating some CPAP and T-bar trials.    He was treated recently [facility] for HCAP and was sent to [facility] for evaluation; I consulted on him on 9/7 and he was stable for d/c back to [facility] at that time.  Returned to ED with COVID-19 positive labs and worsening hypoxia.  I spoke with his sister and brother-in-law. They had many excellent questions about treatments and prognosis and understand the severity of his condition.  His sister reports that he would prefer to be DNR at this time. [name] who is trach and Vent dependent from [facility] diagnosed with Covid 19 and sent to the er as they don't keep Covid pt. He seems to be at his baseline.  Review of Systems: unable to perform due to current condition  Assessment/Plan Principal Problem:   Acute on chronic respiratory failure with hypoxia and hypercapnia (HCC) Active Problems:   Atrial fibrillation (HCC)   Acute respiratory disease due to COVID-19 virus   Respiratory tract  infection due to COVID-19 virus   Chronic systolic CHF (congestive heart failure) (HCC)    Acute on chronic respiratory failure with hypoxia and hypercapnia, ongoing - VAP 1 month prior to admission and treated with Cefepime and Vanc - resolved -Worsening hypoxia x2weeks, noted COVID-19 infection on 10/7 -Follow CTA, appears to be unremarkable for PE, left-sided atelectasis and inferior lung appears to be collapsed somewhat chronic appearing due to calcifications -Remdesivir stop date 10/14; Methylprednisolone 10/19 -Convalescent plasma given 11th Recent Labs    09/02/19 0324 09/03/19 0600 09/04/19 0530  DDIMER 1.84* 1.17* 1.33*  FERRITIN  --  122 109  CRP <0.8 <0.8 <0.8    CHF/Afib, currently rate controlled -Continue home meds including Lipitor, Coreg, ASA -Continue to keep patient euvolemic as possible, on PEG tube feeds currently, follow I's and O's Intake/Output Summary (Last 24 hours) at 09/04/2019 1051 Last data filed at 09/04/2019 0600 Gross per 24 hour  Intake 1830.2 ml  Output 1050 ml  Net 780.2 ml    Questionable acute metabolic encephalopathy, likely secondary to above Depression/anxiety - On Klonopin, Lexapro, and Seroquel via NG - Now on precedex gtt and fent/versed pushes for agitation - continues to pull at IV/leads/trach today   DVT prophylaxis:  Lovenox  Code Status:  DNR - confirmed with family Family Communication:  PCCM Disposition Plan:  To be determined, pending clinical course Consults called: PCCM Admission status: Remain inpatient - continues to require ventilator support well above baseline needs and ongoing treatment for COVID with IV remdesivir, steroids.  Ambulatory  Status:  Nonambulatory  Past Medical History:  Diagnosis Date  . DDD (degenerative disc disease), lumbar   . Fibromyositis   . Osteoporosis   . S/P percutaneous endoscopic gastrostomy (PEG) tube placement ([place])   . Scoliosis   . Spinal stenosis   . Systolic CHF, chronic  ([place]) 04/2019   EF 20-25%, ACID recommended  . Tracheostomy tube present ([place])   . Ventilator dependence [facility])     Past Surgical History:  Procedure Laterality Date  . CHOLECYSTECTOMY    . JOINT REPLACEMENT    . REPLACEMENT TOTAL KNEE BILATERAL Bilateral   . TONSILECTOMY, ADENOIDECTOMY, BILATERAL MYRINGOTOMY AND TUBES    . TRACHEOSTOMY      Social History   Socioeconomic History  . Marital status: Married    Spouse name: Not on file  . Number of children: Not on file  . Years of education: Not on file  . Highest education level: Not on file  Occupational History  . Not on file  Social Needs  . Financial resource strain: Not on file  . Food insecurity    Worry: Not on file    Inability: Not on file  . Transportation needs    Medical: Not on file    Non-medical: Not on file  Tobacco Use  . Smoking status: Former Smoker    Packs/day: 1.50    Years: 52.00    Pack years: 78.00    Types: Cigarettes  . Smokeless tobacco: Never Used  Substance and Sexual Activity  . Alcohol use: No  . Drug use: No  . Sexual activity: Not on file  Lifestyle  . Physical activity    Days per week: Patient refused    Minutes per session: Patient refused  . Stress: Not on file  Relationships  . Social Herbalist on phone: Patient refused    Gets together: Patient refused    Attends religious service: Patient refused    Active member of club or organization: Patient refused    Attends meetings of clubs or organizations: Patient refused    Relationship status: Patient refused  . Intimate partner violence    Fear of current or ex partner: Not on file    Emotionally abused: Not on file    Physically abused: Not on file    Forced sexual activity: Not on file  Other Topics Concern  . Not on file  Social History Narrative  . Not on file    Allergies  Allergen Reactions  . Bacitracin Other (See Comments)    Per MAR  . Cefdinir Other (See Comments)    Per MAR  . Lidocaine  Other (See Comments)    Per MAR  . Neomycin Other (See Comments)    Per MAR  . Neosporin Af [Miconazole] Other (See Comments)    Unknown 9.7.2020 Per [facility] MAR pt is currently using Miconazole Topical Poweder  . Pantoprazole Other (See Comments)    unknown  . Penicillins Other (See Comments)    Per MAR  . Polymyxin B Other (See Comments)    Per MAR  . Pramoxine Other (See Comments)    Per MAR    Family History  Problem Relation Age of Onset  . Scoliosis Mother   . Fibromyalgia Mother   . Prostate cancer Father     Prior to Admission medications   Medication Sig Start Date End Date Taking? Authorizing Provider  acetaminophen (TYLENOL) 325 MG tablet Place 650 mg into feeding tube every 6 (  six) hours as needed for mild pain, fever or headache.    [provider]  albuterol (PROVENTIL) (2.5 MG/3ML) 0.083% nebulizer solution Take 2.5 mg by nebulization every 2 (two) hours as needed for wheezing or shortness of breath.    [provider]  aspirin 81 MG chewable tablet Place 81 mg into feeding tube daily.    [provider]  atorvastatin (LIPITOR) 20 MG tablet Place 20 mg into feeding tube daily.    [provider]  carvedilol (COREG) 12.5 MG tablet Place 12.5 mg into feeding tube 2 (two) times daily.  07/23/16   [provider]  ceFEPIme 1 g in sodium chloride 0.9 % 100 mL Inject 1 g into the vein every 8 (eight) hours. For seven days Started on 9.4.2020    [provider]  chlorhexidine gluconate, MEDLINE KIT, (PERIDEX) 0.12 % solution Use as directed 15 mLs in the mouth or throat 3 (three) times daily.    [provider]  clonazePAM (KLONOPIN) 1 MG tablet Place 1 mg into feeding tube every 8 (eight) hours.    [provider]  docusate sodium (COLACE) 100 MG capsule 100 mg daily. Per tube    [provider]  escitalopram (LEXAPRO) 10 MG tablet Place 10 mg into feeding tube daily.    [provider]  furosemide (LASIX) 40 MG tablet Place 40 mg into feeding tube daily.    [provider]  heparin 5000 UNIT/ML injection Inject 5,000 Units into the skin every 8 (eight) hours.    [provider]  ipratropium-albuterol (DUONEB) 0.5-2.5 (3) MG/3ML SOLN Take 3 mLs by nebulization every 6 (six) hours.    [provider]  lansoprazole (PREVACID) 30 MG capsule Place 30 mg into feeding tube daily at 12 noon.    [provider]  miconazole (ANTIFUNGAL) 2 % powder Apply 1 application topically daily.    [provider]  morphine (MSIR) 15 MG tablet Place 15 mg into feeding tube every 8 (eight) hours.    [provider]  Nutritional Supplements (ISOSOURCE 1.5 CAL PO) Give by tube.    [provider]  ondansetron (ZOFRAN) 4 MG tablet Place 4 mg into feeding tube every 8 (eight) hours as needed for nausea or vomiting.    [provider]  polyethylene glycol (MIRALAX) 17 g packet Place 17 g into feeding tube daily.    [provider]  predniSONE (DELTASONE) 10 MG tablet Place 30 mg into feeding tube daily with breakfast.    [provider]  QUEtiapine (SEROQUEL) 50 MG tablet Place 50 mg into feeding tube every 12 (twelve) hours as needed (increased anxiousness/behavior).    [provider]  vancomycin IVPB Inject 1,000 mg into the vein daily. For seven days Started on 9.5.2020    [provider]    Physical Exam: Vitals:   09/04/19 0500 09/04/19 0600 09/04/19 0700 09/04/19 0936  BP: 120/74 124/72 120/74 123/73  Pulse: (!) 50 (!) 55 (!) 54 (!) 47  Resp: (!) 24 (!) 24 (!) 24   Temp:      TempSrc:      SpO2: 94% 96% 93%   Weight:      Height:         General:  Chronically ill appearing obese, resting comfortably in no acute distress, does not appear to be oriented to place today Eyes:   EOMI, normal lids, iris ENT:  grossly normal hearing (opens eyes to voice), trach in  place  Cardiovascular:  RRR, no m/r/g. No LE edema.  Respiratory:   Scattered rhonchi with excessive secretions.  Mildly increased respiratory rate. Abdomen: Soft, NT, ND; PEG tube in place; diffuse ecchymoses over Skin:  no rash or induration seen on limited exam Musculoskeletal:  atrophic tone BUE/BLE, good ROM, no bony abnormality   Radiological Exams on Admission: Ct Angio Chest Pe W Or Wo Contrast  Result Date: 09/03/2019 CLINICAL DATA:  History of pulmonary embolus. EXAM: CT ANGIOGRAPHY CHEST WITH CONTRAST TECHNIQUE: Multidetector CT imaging of the chest was performed using the standard protocol during bolus administration of intravenous contrast. Multiplanar CT image reconstructions and MIPs were obtained to evaluate the vascular anatomy. CONTRAST:  18m OMNIPAQUE IOHEXOL 350 MG/ML SOLN COMPARISON:  September 02, 2019. FINDINGS: Cardiovascular: Satisfactory opacification of the pulmonary arteries to the segmental level. No evidence of pulmonary embolism. Normal heart size. No pericardial effusion. No evidence of thoracic aortic dissection or aneurysm. Mediastinum/Nodes: Endotracheal tube is seen well position within trachea. Mild calcified lymph nodes are noted. Esophagus is unremarkable. Thyroid gland is unremarkable. Lungs/Pleura: No pneumothorax is noted. Atelectasis of the left lower lobe is noted resulting in volume loss and mediastinal shift. Multifocal ground-glass opacities noted in the left upper lobe and to some degree the right upper lobe concerning for multifocal pneumonia. Upper Abdomen: No acute abnormality. Musculoskeletal: No chest wall abnormality. No acute or significant osseous findings. Review of the MIP images confirms the above findings. IMPRESSION: No definite evidence of pulmonary embolus. Atelectasis of left lower lobe is noted resulting in volume loss and mediastinal shift to the left. Multifocal ground-glass opacities are noted in the left upper lobe and the right upper lobe  consistent with multifocal pneumonia. Endotracheal tube appears to be well position. Electronically Signed   By: JMarijo ConceptionM.D.   On: 09/03/2019 15:32   EKG: Independently reviewed.  NSR with rate 96; nonspecific ST changes which appear to indicate repolarization  Total time: 35 minutes  [facility]ospital[facility]  09/04/2019, 10:51 AM

## 2019-09-04 NOTE — Progress Notes (Signed)
Upon reading nursing note from earlier, Patient mentioned wanting to contact a wife. Found Sergio Mitchell, Domestic partner listed (12/2018) [phone]

## 2019-09-05 ENCOUNTER — Inpatient Hospital Stay (HOSPITAL_COMMUNITY): Payer: Medicare Other

## 2019-09-05 DIAGNOSIS — U071 COVID-19: Secondary | ICD-10-CM | POA: Diagnosis not present

## 2019-09-05 DIAGNOSIS — Z930 Tracheostomy status: Secondary | ICD-10-CM

## 2019-09-05 DIAGNOSIS — L8990 Pressure ulcer of unspecified site, unspecified stage: Secondary | ICD-10-CM | POA: Diagnosis present

## 2019-09-05 DIAGNOSIS — J9621 Acute and chronic respiratory failure with hypoxia: Secondary | ICD-10-CM | POA: Diagnosis not present

## 2019-09-05 DIAGNOSIS — J9622 Acute and chronic respiratory failure with hypercapnia: Secondary | ICD-10-CM | POA: Diagnosis not present

## 2019-09-05 DIAGNOSIS — I5022 Chronic systolic (congestive) heart failure: Secondary | ICD-10-CM | POA: Diagnosis not present

## 2019-09-05 LAB — GLUCOSE, CAPILLARY
Glucose-Capillary: 149 mg/dL — ABNORMAL HIGH (ref 70–99)
Glucose-Capillary: 178 mg/dL — ABNORMAL HIGH (ref 70–99)
Glucose-Capillary: 122 mg/dL — ABNORMAL HIGH (ref 70–99)
Glucose-Capillary: 128 mg/dL — ABNORMAL HIGH (ref 70–99)
Glucose-Capillary: 160 mg/dL — ABNORMAL HIGH (ref 70–99)
Glucose-Capillary: 142 mg/dL — ABNORMAL HIGH (ref 70–99)

## 2019-09-05 LAB — COMPREHENSIVE METABOLIC PANEL
ALT: 21 U/L (ref 0–44)
AST: 18 U/L (ref 15–41)
Albumin: 3 g/dL — ABNORMAL LOW (ref 3.5–5.0)
Alkaline Phosphatase: 37 U/L — ABNORMAL LOW (ref 38–126)
Anion gap: 10 (ref 5–15)
BUN: 58 mg/dL — ABNORMAL HIGH (ref 8–23)
CO2: 31 mmol/L (ref 22–32)
Calcium: 9.7 mg/dL (ref 8.9–10.3)
Chloride: 99 mmol/L (ref 98–111)
Creatinine, Ser: 0.82 mg/dL (ref 0.61–1.24)
GFR calc Af Amer: >60 mL/min (ref >60)
GFR calc non Af Amer: >60 mL/min (ref >60)
Glucose, Bld: 162 mg/dL — ABNORMAL HIGH (ref 70–99)
Potassium: 3.7 mmol/L (ref 3.5–5.1)
Sodium: 140 mmol/L (ref 135–145)
Total Bilirubin: 0.7 mg/dL (ref 0.3–1.2)
Total Protein: 6.2 g/dL — ABNORMAL LOW (ref 6.5–8.1)

## 2019-09-05 LAB — PROTIME-INR
INR: 1.1 (ref 0.8–1.2)
Prothrombin Time: 14.1 seconds (ref 11.4–15.2)

## 2019-09-05 LAB — CBC WITH DIFFERENTIAL/PLATELET
Abs Immature Granulocytes: 0.15 10*3/uL — ABNORMAL HIGH (ref 0.00–0.07)
Basophils Absolute: 0 10*3/uL (ref 0.0–0.1)
Basophils Relative: 0 %
Eosinophils Absolute: 0 10*3/uL (ref 0.0–0.5)
Eosinophils Relative: 0 %
HCT: 33.6 % — ABNORMAL LOW (ref 39.0–52.0)
Hemoglobin: 10.7 g/dL — ABNORMAL LOW (ref 13.0–17.0)
Immature Granulocytes: 1 %
Lymphocytes Relative: 4 %
Lymphs Abs: 0.7 10*3/uL (ref 0.7–4.0)
MCH: 31.8 pg (ref 26.0–34.0)
MCHC: 31.8 g/dL (ref 30.0–36.0)
MCV: 99.7 fL (ref 80.0–100.0)
Monocytes Absolute: 1 10*3/uL (ref 0.1–1.0)
Monocytes Relative: 5 %
Neutro Abs: 17.2 10*3/uL — ABNORMAL HIGH (ref 1.7–7.7)
Neutrophils Relative %: 90 %
Platelets: 369 10*3/uL (ref 150–400)
RBC: 3.37 MIL/uL — ABNORMAL LOW (ref 4.22–5.81)
RDW: 13.9 % (ref 11.5–15.5)
WBC: 19 10*3/uL — ABNORMAL HIGH (ref 4.0–10.5)
nRBC: 0 % (ref 0.0–0.2)

## 2019-09-05 LAB — PHOSPHORUS: Phosphorus: 2.7 mg/dL (ref 2.5–4.6)

## 2019-09-05 LAB — D-DIMER, QUANTITATIVE: D-Dimer, Quant: 1.14 ug/mL-FEU — ABNORMAL HIGH (ref 0.00–0.50)

## 2019-09-05 LAB — MAGNESIUM: Magnesium: 2 mg/dL (ref 1.7–2.4)

## 2019-09-05 LAB — FERRITIN: Ferritin: 88 ng/mL (ref 24–336)

## 2019-09-05 LAB — FIBRINOGEN: Fibrinogen: 556 mg/dL — ABNORMAL HIGH (ref 210–475)

## 2019-09-05 LAB — C-REACTIVE PROTEIN: CRP: <0.8 mg/dL (ref <1.0)

## 2019-09-05 MED ORDER — IPRATROPIUM-ALBUTEROL 0.5-2.5 (3) MG/3ML IN SOLN
3.0000 mL | Freq: Two times a day (BID) | RESPIRATORY_TRACT | Status: DC
  Administered 2019-09-05 – 2019-09-13 (×15): 3 mL via RESPIRATORY_TRACT
  Filled 2019-09-05 (×15): qty 3

## 2019-09-05 MED ORDER — FUROSEMIDE 10 MG/ML IJ SOLN
20.0000 mg | Freq: Once | INTRAMUSCULAR | Status: AC
  Administered 2019-09-05: 20 mg via INTRAVENOUS
  Filled 2019-09-05: qty 2

## 2019-09-05 NOTE — Progress Notes (Signed)
Assisted patient with facetime visit with sister, Fraser Din.

## 2019-09-05 NOTE — Progress Notes (Signed)
ANTICOAGULATION CONSULT NOTE - Follow Up Consult  Pharmacy Consult for Lovenox Indication: VTE prophylaxis (COVID-PACT trial)  Allergies  Allergen Reactions  . Bacitracin Other (See Comments)    Per MAR  . Cefdinir Other (See Comments)    Per MAR  . Lidocaine Other (See Comments)    Per MAR  . Neomycin Other (See Comments)    Per MAR  . Neosporin Af [Miconazole] Other (See Comments)    Unknown 9.7.2020 Per [facility] MAR pt is currently using Miconazole Topical Poweder  . Pantoprazole Other (See Comments)    unknown  . Penicillins Other (See Comments)    Per MAR  . Polymyxin B Other (See Comments)    Per MAR  . Pramoxine Other (See Comments)    Per [facility]    Patient Measurements: Height: '6\' 2"'  (188 cm) Weight: 239 lb 6.7 oz (108.6 kg) IBW/kg (Calculated) : 82.2  Vital Signs: Temp: 98.4 F (36.9 C) (10/13 0800) Temp Source: Oral (10/13 0800) BP: 143/78 (10/13 1000) Pulse Rate: 84 (10/13 1000)  Labs: Recent Labs    09/03/19 0600 09/04/19 0530 09/05/19 0546  HGB 10.1* 10.7* 10.7*  HCT 31.1* 32.4* 33.6*  PLT 328 346 369  LABPROT 13.8 14.1 14.1  INR 1.1 1.1 1.1  CREATININE 0.86 0.80 0.82    Estimated Creatinine Clearance: 111.6 mL/min (by C-G formula based on SCr of 0.82 mg/dL).   Medical History: Past Medical History:  Diagnosis Date  . DDD (degenerative disc disease), lumbar   . Fibromyositis   . Osteoporosis   . S/P percutaneous endoscopic gastrostomy (PEG) tube placement ([place])   . Scoliosis   . Spinal stenosis   . Systolic CHF, chronic ([place]) 04/2019   EF 20-25%, ACID recommended  . Tracheostomy tube present ([place])   . Ventilator dependence (H[facility])     Medications:  Scheduled:  . sodium chloride   Intravenous Once  . aspirin  81 mg Per Tube Daily  . atorvastatin  20 mg Per Tube Daily  . carvedilol  12.5 mg Per Tube BID WC  . chlorhexidine gluconate (MEDLINE KIT)  15 mL Mouth Rinse BID  . Chlorhexidine Gluconate Cloth  6 each Topical Daily  .  clonazePAM  1 mg Per Tube Q8H  . docusate  100 mg Per Tube Daily  . enoxaparin (LOVENOX) injection  40 mg Subcutaneous Q24H  . escitalopram  10 mg Per Tube Daily  . famotidine  20 mg Oral QHS  . feeding supplement (PRO-STAT SUGAR FREE 64)  60 mL Per Tube TID  . ipratropium-albuterol  3 mL Nebulization Q6H  . mouth rinse  15 mL Mouth Rinse 10 times per day  . methylPREDNISolone (SOLU-MEDROL) injection  55 mg Intravenous Q12H  . morphine  15 mg Per Tube Q8H  . mupirocin ointment   Nasal BID  . polyethylene glycol  17 g Per Tube Daily  . sodium chloride flush  3 mL Intravenous Q12H  . sodium chloride flush  3 mL Intravenous Q12H  . sodium chloride HYPERTONIC  4 mL Nebulization BID   Infusions:  . sodium chloride Stopped (09/03/19 2130)  . dexmedetomidine (PRECEDEX) IV infusion Stopped (09/04/19 1625)  . feeding supplement (VITAL HIGH PROTEIN) 55 mL/hr at 09/04/19 0600  . remdesivir 100 mg in NS 250 mL 500 mL/hr at 09/05/19 1000   PRN: sodium chloride, acetaminophen, albuterol, bisacodyl, fentaNYL (SUBLIMAZE) injection, fentaNYL (SUBLIMAZE) injection, midazolam, ondansetron **OR** ondansetron (ZOFRAN) IV, oxyCODONE, QUEtiapine, sodium chloride flush, zolpidem  Assessment: 68 yo male admitted from  [facility] on 09/02/2019 for hypoxemia due to COVID 19 pneumonia. Patient has been enrolled into the COVID-PACT study and assigned to Lovenox VTE prophylaxis arm. Pharmacy has been consulted to dose enoxaparin for VTE prophylaxis per the COVID-PACT trial. Patient weighs 108.6kg and BMI is 31. CrCl is 111.63m/min. d-Dimer 1.14.   Patient has been receiving enoxaparin 438mq24h since trial enrollment which is appropriate based on CrCl >30 and BMI <35. Hgb and plt are stable. No reported bleeding.     Goal of Therapy:  Prevention of VTE Monitor platelets by anticoagulation protocol: Yes   Plan:  Continue enoxaparin 4075mQ q24h per COVID-PACT protocol Monitor CBC and signs/symptoms of  bleeding   GraCristela FeltharmD PGY1 Pharmacy Resident [employer]: [phone]/13/2020,11:08 AM

## 2019-09-05 NOTE — Progress Notes (Signed)
NAME:  Sergio Mitchell, MRN:  [id number], DOB:  03-30-1951, LOS: 3 ADMISSION DATE:  09/02/2019, CONSULTATION DATE:  10/10 REFERRING MD:  Lorin Mercy, CHIEF COMPLAINT:  Hypoxemia   Brief History   68 y/o male admitted from [place] to [facility] on 10/10 for hypoxemia due to COVID 19 pneumonia.  At baseline the patient has ventilator dependent respiratory failure and systolic heart failure.   History of present illness   68 y/o male with multiple medical problems including systolic heart failure, chronic respiratory failure wth hypoxemia.  At baseline the patient had minimal medical problems prior to 03/2019 when he was admitted to a hospital in [place] with cardiogenic shock, LVEF noted to be 20-25%.  During his hospitalization he required mechanical ventilation, eventually a tracheostomy, and developed septic shock.  He was eventually discharged to [facility] in mid July 2020 where he remained ventilator dependent but was able to perform some ventilator weaning.  He was noted to have COVID on 10/7 and has developed hypoxemia since.  He was brought to the [facility] ER and transferred to [facility].  The patient could provide no history.  Past Medical History  Chronic respiratory failure with hypoxemia, ventilator dependent Tracheostomy status Chronic systolic heart faiulre Fibromyalgia Chronic pain Osteoporosis Scoliosis Spinal stenosis  Significant Hospital Events   10/10 admission  Consults:  PCCM  Procedures:  Tracheostomy, chronic (May 2020?)  Significant Diagnostic Tests:    Micro Data:  10/7 SARS COV 2 Positive [facility])  Antimicrobials/COVID Treatment  10/10 Convalescent plasma > 10/10 decadron >  10/10 remdesivir >    Interim history/subjective:  No issues overnight.  Remains in the intensive care unit on mechanical support.  Tolerating pressure support trials this morning well.  Awake alert.  Objective   Blood pressure (!) 141/84, pulse 81, temperature 98.4 F (36.9  C), temperature source Oral, resp. rate (!) 26, height _0  (1.88 m), weight 108.6 kg, SpO2 100 %.    Vent Mode: PRVC FiO2 (%):  [50 %-80 %] 50 % Set Rate:  [24 bmp] 24 bmp Vt Set:  [500 mL] 500 mL PEEP:  [8 cmH20] 8 cmH20 Plateau Pressure:  [17 cmH20-31 cmH20] 25 cmH20   Intake/Output Summary (Last 24 hours) at 09/05/2019 0843 Last data filed at 09/05/2019 0700 Gross per 24 hour  Intake 1574.79 ml  Output 1125 ml  Net 449.79 ml   Filed Weights   09/02/19 1700 09/03/19 0500 09/05/19 0500  Weight: 110.2 kg 109.9 kg 108.6 kg    Examination:  General: Patient resting comfortably on mechanical ventilation, tracheostomy tube in place HENT: Tracheostomy tube in place, clean dry and intact no significant secretions, NCAT, tracking appropriately PULM: Bilateral vent supported breath sounds CV: Regular rate and rhythm, S1-S2 GI: Soft, nontender, nondistended MSK: Normal bulk and tone Neuro: Awake alert following commands no focal deficit  Chest x-ray 10/13: Bilateral infiltrates.  Stable trach in place stable. The patient's images have been independently reviewed by me.    Resolved Hospital Problem list     Assessment & Plan:   Acute on chronic respiratory failure with hypoxemia due to COVID 19 pneumonia, generalized weakness, chronic systolic heart failure Patient remains in the intensive care unit for close hemodynamic and respiratory support Remains on full mechanical ventilatory support at this time.  Tolerating pressure support CPAP trial today. Continue routine tracheostomy care, pulmonary toileting Continue Decadron plus remdesivir  Talking with respiratory therapy per documentation from [facility] the patient has been requiring mechanical support for the  past 3 weeks. We will continue our weaning process here however main remain vent dependent unknown at this time.  Chronic systolic heart failure Continue home dose Coreg Monitor I's and O's and diuresis to maintain  euvolemia. Lasix 20 mg IV x1  Physical deconditioning Continue PT OT as tolerated Or if the patient will allow this.  He has been refusing some care on and off per nursing staff.  Rising leukocytosis Continue to observe, likely steroid related multifactorial  Best practice:  Diet: Tube feeds, advancing diet as tolerated.  Negative outlook message Pain/Anxiety/Delirium protocol (if indicated): no VAP protocol (if indicated): yes DVT prophylaxis: interested in [facility] PACT trial, for now use lovenox 0.42m/kg sub cutaneous q12h per COVID ICU protocol GI prophylaxis: lansoprazole for stress ulcer prophylaxis Glucose control: SSI Mobility: bed rest Code Status: DNR [facility]   [facility]abs   CBC: Recent Labs  Lab 09/02/19 0234 09/02/19 0301 09/03/19 0600 09/04/19 0530 09/05/19 0546  WBC  --  16.0* 18.7* 24.0* 19.0*  NEUTROABS  --  12.4* 15.7* 21.6* 17.2*  HGB 11.9* 11.3* 10.1* 10.7* 10.7*  HCT 35.0* 34.9* 31.1* 32.4* 33.6*  MCV  --  100.0 97.5 95.9 99.7  PLT  --  362 328 346 3329   Basic Metabolic Panel: Recent Labs  Lab 09/02/19 0234 09/02/19 0301 09/03/19 0600 09/03/19 1700 09/04/19 0530  NA 138 140 138  --  139  K 3.8 3.7 3.5  --  3.4*  CL  --  95* 96*  --  97*  CO2  --  33* 32  --  30  GLUCOSE  --  111* 128*  --  150*  BUN  --  27* 37*  --  40*  CREATININE  --  0.77 0.86  --  0.80  CALCIUM  --  10.2 10.0  --  10.2  MG  --   --  2.0 2.0 2.0  PHOS  --   --  2.9 3.3 3.0   GFR: Estimated Creatinine Clearance: 114.4 mL/min (by C-G formula based on SCr of 0.8 mg/dL). Recent Labs  Lab 09/02/19 0301 09/02/19 0324 09/03/19 0600 09/04/19 0530 09/05/19 0546  PROCALCITON  --  <0.10  --   --   --   WBC 16.0*  --  18.7* 24.0* 19.0*  LATICACIDVEN 1.0  --   --   --   --     Liver Function Tests: Recent Labs  Lab 09/02/19 0301 09/03/19 0600 09/04/19 0530  AST _0 ALT _1 ALKPHOS 43 43 45  BILITOT 0.3 0.9 0.3  PROT 6.9  6.4* 6.4*  ALBUMIN 3.2* 3.3* 3.0*   No results for input(s): LIPASE, AMYLASE in the last 168 hours. No results for input(s): AMMONIA in the last 168 hours.  ABG    Component Value Date/Time   PHART 7.485 (H) 09/02/2019 0234   PCO2ART 51.2 (H) 09/02/2019 0234   PO2ART 152.0 (H) 09/02/2019 0234   HCO3 38.3 (H) 09/02/2019 0234   TCO2 40 (H) 09/02/2019 0234   O2SAT 99.0 09/02/2019 0234     Coagulation Profile: Recent Labs  Lab 09/03/19 0600 09/04/19 0530  INR 1.1 1.1    Cardiac Enzymes: No results for input(s): CKTOTAL, CKMB, CKMBINDEX, TROPONINI in the last 168 hours.  HbA1C: No results found for: HGBA1C  CBG: Recent Labs  Lab 09/04/19 1222 09/04/19 1550 09/04/19 1950 09/05/19 0439 09/05/19 0800  GLUCAP 124* 154* 117* 178* 122*  Review of Systems:   Cannot obtain due to chronic trach, inability to talk  Past Medical History  He,  has a past medical history of DDD (degenerative disc disease), lumbar, Fibromyositis, Osteoporosis, S/P percutaneous endoscopic gastrostomy (PEG) tube placement [facility]), Scoliosis, Spinal stenosis, Systolic CHF, chronic ([place]) (04/2019), Tracheostomy tube present ([place]), and Ventilator dependence ([place]).   Surgical History    Past Surgical History:  Procedure Laterality Date  . CHOLECYSTECTOMY    . JOINT REPLACEMENT    . REPLACEMENT TOTAL KNEE BILATERAL Bilateral   . TONSILECTOMY, ADENOIDECTOMY, BILATERAL MYRINGOTOMY AND TUBES    . TRACHEOSTOMY       Social History   reports that he has quit smoking. His smoking use included cigarettes. He has a 78.00 pack-year smoking history. He has never used smokeless tobacco. He reports that he does not drink alcohol or use drugs.   Family History   His family history includes Fibromyalgia in his mother; Prostate cancer in his father; Scoliosis in his mother.   Allergies Allergies  Allergen Reactions  . Bacitracin Other (See Comments)    Per MAR  . Cefdinir Other (See Comments)    Per MAR   . Lidocaine Other (See Comments)    Per MAR  . Neomycin Other (See Comments)    Per MAR  . Neosporin Af [Miconazole] Other (See Comments)    Unknown 9.7.2020 Per [facility] MAR pt is currently using Miconazole Topical Poweder  . Pantoprazole Other (See Comments)    unknown  . Penicillins Other (See Comments)    Per MAR  . Polymyxin B Other (See Comments)    Per MAR  . Pramoxine Other (See Comments)    Per MAR     Home Medications  Prior to Admission medications   Medication Sig Start Date End Date Taking? Authorizing Provider  acetaminophen (TYLENOL) 325 MG tablet Place 650 mg into feeding tube every 6 (six) hours as needed for mild pain, fever or headache.   Yes [provider]  albuterol (PROVENTIL) (2.5 MG/3ML) 0.083% nebulizer solution Take 2.5 mg by nebulization every 2 (two) hours as needed for wheezing or shortness of breath.   Yes [provider]  aspirin 81 MG chewable tablet Place 81 mg into feeding tube daily.   Yes [provider]  atorvastatin (LIPITOR) 20 MG tablet Place 20 mg into feeding tube daily.   Yes [provider]  carvedilol (COREG) 12.5 MG tablet Place 12.5 mg into feeding tube 2 (two) times daily.  07/23/16  Yes [provider]  chlorhexidine gluconate, MEDLINE KIT, (PERIDEX) 0.12 % solution Use as directed 15 mLs in the mouth or throat 3 (three) times daily.   Yes [provider]  Cholecalciferol (VITAMIN D3) 125 MCG (5000 UT) CAPS Take 5,000 Units by mouth daily.   Yes [provider]  clonazePAM (KLONOPIN) 1 MG tablet Place 1 mg into feeding tube every 8 (eight) hours.   Yes [provider]  docusate sodium (COLACE) 100 MG capsule Take 200 mg by mouth daily. Per tube    Yes [provider]  escitalopram (LEXAPRO) 10 MG tablet Place 10 mg into feeding tube daily.   Yes [provider]  furosemide (LASIX) 40 MG tablet Place 40 mg into feeding tube daily.   Yes [provider]  heparin 5000 UNIT/ML injection Inject 5,000 Units into the skin every 8 (eight) hours.   Yes [provider]  ipratropium-albuterol (DUONEB) 0.5-2.5 (3) MG/3ML SOLN Take 3 mLs by  nebulization every 6 (six) hours.   Yes [provider]  lansoprazole (PREVACID) 30 MG capsule Place 30 mg into feeding tube daily at 12 noon.   Yes [provider]  miconazole (ANTIFUNGAL) 2 % powder Apply 1 application topically daily.   Yes [provider]  morphine (MSIR) 15 MG tablet Place 15 mg into feeding tube every 8 (eight) hours.   Yes [provider]  Nutritional Supplements (ISOSOURCE 1.5 CAL) LIQD Take 60 mLs by mouth 3 (three) times daily.   Yes [provider]  ondansetron (ZOFRAN) 4 MG tablet Place 4 mg into feeding tube every 8 (eight) hours as needed for nausea or vomiting.   Yes [provider]  polyethylene glycol (MIRALAX) 17 g packet Place 17 g into feeding tube daily.   Yes [provider]  predniSONE (DELTASONE) 10 MG tablet Place 30 mg into feeding tube daily with breakfast.   Yes [provider]  vitamin C (ASCORBIC ACID) 500 MG tablet Take 500 mg by mouth daily.   Yes [provider]  zinc gluconate 50 MG tablet Take 50 mg by mouth daily.   Yes [provider]  QUEtiapine (SEROQUEL) 50 MG tablet Place 50 mg into feeding tube every 12 (twelve) hours as needed (increased anxiousness/behavior).    [provider]    This patient is critically ill with multiple organ system failure; which, requires frequent high complexity decision making, assessment, support, evaluation, and titration of therapies. This was completed through the application of advanced monitoring technologies and extensive interpretation of multiple databases. During this encounter critical care time was devoted to patient care services described in this note for 32 minutes.  Garner Nash, DO [facility] 09/05/2019 8:43 AM  Personal pager: [phone] If unanswered, please page CCM On-call: [phone]

## 2019-09-05 NOTE — Progress Notes (Signed)
Spoke with patient's sister, Fraser Din, and provided full update and answered all questions.

## 2019-09-05 NOTE — Progress Notes (Signed)
Progress Note   Sergio Mitchell ZOX:[id number] DOB: June 20, 1951 DOA: 09/02/2019  PCP: [name], MD Consultants:  None Patient coming from: [facility]; NOK: [name], [phone] (mobile), [phone] (home)  Chief Complaint: COVID-19 infection reported per facility.  HPI: Sergio Mitchell is a 68 y.o. male with medical history significant of trach-dependent respiratory failure with PEG tube; chronic systolic CHF; and afib presenting with COVID-19 infection.   The patient is a tragic one who appeared to be in reasonably good health other than fibromyalgia and chronic pain until he was admitted at [facility] on 5/25.  He presented with CP and SOB and eventually was diagnosed with cardiogenic shock with EF 20-25%, recommended for AICD.  However, he also developed septic shock from HCAP with BAL positive for H flu.  He required trach/peg and was discharged to [facility].  He also had encephalopathy with anxiety and was treated by psychiatry.  He was admitted to [place] on 7/15 and has been tolerating some CPAP and T-bar trials.    He was treated recently [facility] for HCAP and was sent [facility]; I consulted on him on 9/7 and he was stable for d/c back to [facility] at that time.  Returned to ED with COVID-19 positive labs and worsening hypoxia.  I spoke with his sister and brother-in-law. They had many excellent questions about treatments and prognosis and understand the severity of his condition.  His sister reports that he would prefer to be DNR at this time. [name] who is trach and Vent dependent from [facility] diagnosed with Covid 19 and sent to the er as they don't keep Covid pt. He seems to be at his baseline.  Review of Systems: unable to perform due to current condition  Assessment/Plan Principal Problem:   Acute on chronic respiratory failure with hypoxia and hypercapnia (HCC) Active Problems:   Atrial fibrillation (HCC)   Acute respiratory disease due to COVID-19  virus   Respiratory tract infection due to COVID-19 virus   Chronic systolic CHF (congestive heart failure) (HCC)   Acute on chronic respiratory failure with hypoxia and hypercapnia, ongoing - VAP 1 month prior to admission and treated with Cefepime and Vanc - resolved - Worsening hypoxia x3weeks prior to hospitalization, noted COVID-19 infection on 10/7 - CTA, unremarkable for PE, left-sided atelectasis and inferior lung appears to be collapsed somewhat chronic appearing due to calcifications -Remdesivir stop date 10/14; Methylprednisolone 10/19 -Convalescent plasma given 11th - Pending repeat COVID swab could discharge back to [facility] pending their protocol (will follow with case/house super) - appears to have been vent dependent prior to recent covid diagnosis Recent Labs    09/03/19 0600 09/04/19 0530  DDIMER 1.17* 1.33*  FERRITIN 122 109  CRP <0.8 <0.8    CHF/Afib, rate controlled -Continue home meds including Lipitor, Coreg, ASA -Continue to keep patient euvolemic as possible, on PEG tube feeds currently, follow I's and O's Intake/Output Summary (Last 24 hours) at 09/05/2019 0743 Last data filed at 09/05/2019 0700 Gross per 24 hour  Intake 1643.53 ml  Output 1125 ml  Net 518.53 ml    Questionable acute metabolic encephalopathy, likely secondary to above Depression/anxiety - On Klonopin, Lexapro, and Seroquel via NG - Now on precedex gtt and fent/versed pushes for agitation - continues to pull at IV/leads/trach today   DVT prophylaxis:  Lovenox  Code Status:  DNR - confirmed with family Family Communication:  PCCM Disposition Plan:  Likely back to [facility] pending clinical status Consults called:  PCCM Admission status: Remain inpatient - continues to require ventilator support well above baseline needs and ongoing treatment for COVID with IV remdesivir, steroids.  Ambulatory Status:  Nonambulatory  Past Medical History:  Diagnosis Date  . DDD (degenerative disc  disease), lumbar   . Fibromyositis   . Osteoporosis   . S/P percutaneous endoscopic gastrostomy (PEG) tube placement ([place])   . Scoliosis   . Spinal stenosis   . Systolic CHF, chronic ([place]) 04/2019   EF 20-25%, ACID recommended  . Tracheostomy tube present ([place])   . Ventilator dependence [facility])     Past Surgical History:  Procedure Laterality Date  . CHOLECYSTECTOMY    . JOINT REPLACEMENT    . REPLACEMENT TOTAL KNEE BILATERAL Bilateral   . TONSILECTOMY, ADENOIDECTOMY, BILATERAL MYRINGOTOMY AND TUBES    . TRACHEOSTOMY      Social History   Socioeconomic History  . Marital status: Married    Spouse name: Not on file  . Number of children: Not on file  . Years of education: Not on file  . Highest education level: Not on file  Occupational History  . Not on file  Social Needs  . Financial resource strain: Not on file  . Food insecurity    Worry: Not on file    Inability: Not on file  . Transportation needs    Medical: Not on file    Non-medical: Not on file  Tobacco Use  . Smoking status: Former Smoker    Packs/day: 1.50    Years: 52.00    Pack years: 78.00    Types: Cigarettes  . Smokeless tobacco: Never Used  Substance and Sexual Activity  . Alcohol use: No  . Drug use: No  . Sexual activity: Not on file  Lifestyle  . Physical activity    Days per week: Patient refused    Minutes per session: Patient refused  . Stress: Not on file  Relationships  . Social Herbalist on phone: Patient refused    Gets together: Patient refused    Attends religious service: Patient refused    Active member of club or organization: Patient refused    Attends meetings of clubs or organizations: Patient refused    Relationship status: Patient refused  . Intimate partner violence    Fear of current or ex partner: Not on file    Emotionally abused: Not on file    Physically abused: Not on file    Forced sexual activity: Not on file  Other Topics Concern  . Not on  file  Social History Narrative  . Not on file    Allergies  Allergen Reactions  . Bacitracin Other (See Comments)    Per MAR  . Cefdinir Other (See Comments)    Per MAR  . Lidocaine Other (See Comments)    Per MAR  . Neomycin Other (See Comments)    Per MAR  . Neosporin Af [Miconazole] Other (See Comments)    Unknown 9.7.2020 Per [facility] MAR pt is currently using Miconazole Topical [place]  . Pantoprazole Other (See Comments)    unknown  . Penicillins Other (See Comments)    Per MAR  . Polymyxin B Other (See Comments)    Per MAR  . Pramoxine Other (See Comments)    Per MAR    Family History  Problem Relation Age of Onset  . Scoliosis Mother   . Fibromyalgia Mother   . Prostate cancer Father     Prior to Admission  medications   Medication Sig Start Date End Date Taking? Authorizing Provider  acetaminophen (TYLENOL) 325 MG tablet Place 650 mg into feeding tube every 6 (six) hours as needed for mild pain, fever or headache.    [provider]  albuterol (PROVENTIL) (2.5 MG/3ML) 0.083% nebulizer solution Take 2.5 mg by nebulization every 2 (two) hours as needed for wheezing or shortness of breath.    [provider]  aspirin 81 MG chewable tablet Place 81 mg into feeding tube daily.    [provider]  atorvastatin (LIPITOR) 20 MG tablet Place 20 mg into feeding tube daily.    [provider]  carvedilol (COREG) 12.5 MG tablet Place 12.5 mg into feeding tube 2 (two) times daily.  07/23/16   [provider]  ceFEPIme 1 g in sodium chloride 0.9 % 100 mL Inject 1 g into the vein every 8 (eight) hours. For seven days Started on 9.4.2020    [provider]  chlorhexidine gluconate, MEDLINE KIT, (PERIDEX) 0.12 % solution Use as directed 15 mLs in the mouth or throat 3 (three) times daily.    [provider]  clonazePAM (KLONOPIN) 1 MG tablet Place 1 mg into feeding tube every 8 (eight) hours.    [provider]   docusate sodium (COLACE) 100 MG capsule 100 mg daily. Per tube    [provider]  escitalopram (LEXAPRO) 10 MG tablet Place 10 mg into feeding tube daily.    [provider]  furosemide (LASIX) 40 MG tablet Place 40 mg into feeding tube daily.    [provider]  heparin 5000 UNIT/ML injection Inject 5,000 Units into the skin every 8 (eight) hours.    [provider]  ipratropium-albuterol (DUONEB) 0.5-2.5 (3) MG/3ML SOLN Take 3 mLs by nebulization every 6 (six) hours.    [provider]  lansoprazole (PREVACID) 30 MG capsule Place 30 mg into feeding tube daily at 12 noon.    [provider]  miconazole (ANTIFUNGAL) 2 % powder Apply 1 application topically daily.    [provider]  morphine (MSIR) 15 MG tablet Place 15 mg into feeding tube every 8 (eight) hours.    [provider]  Nutritional Supplements (ISOSOURCE 1.5 CAL PO) Give by tube.    [provider]  ondansetron (ZOFRAN) 4 MG tablet Place 4 mg into feeding tube every 8 (eight) hours as needed for nausea or vomiting.    [provider]  polyethylene glycol (MIRALAX) 17 g packet Place 17 g into feeding tube daily.    [provider]  predniSONE (DELTASONE) 10 MG tablet Place 30 mg into feeding tube daily with breakfast.    [provider]  QUEtiapine (SEROQUEL) 50 MG tablet Place 50 mg into feeding tube every 12 (twelve) hours as needed (increased anxiousness/behavior).    [provider]  vancomycin IVPB Inject 1,000 mg into the vein daily. For seven days Started on 9.5.2020    [provider]    Physical Exam: Vitals:   09/05/19 0431 09/05/19 0500 09/05/19 0600 09/05/19 0700  BP: 116/73 (!) 136/59 127/71 123/71  Pulse: 75 94 83 91  Resp: (!) 26 17 (!) 24 17  Temp:      TempSrc:      SpO2: 95% 96% 96% 96%  Weight:  108.6 kg    Height:         General:  Chronically ill appearing obese, resting  comfortably in no acute distress,more awake alert and  oriented today Eyes:   EOMI, normal lids, iris ENT:  grossly normal hearing (opens eyes to voice), trach in place on vent Cardiovascular:  RRR, no m/r/g. No LE edema.  Respiratory:   Scattered rhonchi with excessive secretions.  Mildly increased respiratory rate. Abdomen: Soft, NT, ND; PEG tube in place; diffuse ecchymoses over Skin:  no rash or induration seen on limited exam Musculoskeletal:  atrophic tone BUE/BLE, good ROM, no bony abnormality   Radiological Exams on Admission: Ct Angio Chest Pe W Or Wo Contrast  Result Date: 09/03/2019 CLINICAL DATA:  History of pulmonary embolus. EXAM: CT ANGIOGRAPHY CHEST WITH CONTRAST TECHNIQUE: Multidetector CT imaging of the chest was performed using the standard protocol during bolus administration of intravenous contrast. Multiplanar CT image reconstructions and MIPs were obtained to evaluate the vascular anatomy. CONTRAST:  155m OMNIPAQUE IOHEXOL 350 MG/ML SOLN COMPARISON:  September 02, 2019. FINDINGS: Cardiovascular: Satisfactory opacification of the pulmonary arteries to the segmental level. No evidence of pulmonary embolism. Normal heart size. No pericardial effusion. No evidence of thoracic aortic dissection or aneurysm. Mediastinum/Nodes: Endotracheal tube is seen well position within trachea. Mild calcified lymph nodes are noted. Esophagus is unremarkable. Thyroid gland is unremarkable. Lungs/Pleura: No pneumothorax is noted. Atelectasis of the left lower lobe is noted resulting in volume loss and mediastinal shift. Multifocal ground-glass opacities noted in the left upper lobe and to some degree the right upper lobe concerning for multifocal pneumonia. Upper Abdomen: No acute abnormality. Musculoskeletal: No chest wall abnormality. No acute or significant osseous findings. Review of the MIP images confirms the above findings. IMPRESSION: No definite evidence of pulmonary embolus. Atelectasis of  left lower lobe is noted resulting in volume loss and mediastinal shift to the left. Multifocal ground-glass opacities are noted in the left upper lobe and the right upper lobe consistent with multifocal pneumonia. Endotracheal tube appears to be well position. Electronically Signed   By: JMarijo ConceptionM.D.   On: 09/03/2019 15:32   EKG: Independently reviewed.  NSR with rate 96; nonspecific ST changes which appear to indicate repolarization  Total time: 35 minutes  WLittle IshikawaDO [facility]  09/05/2019, 7:43 AM

## 2019-09-06 DIAGNOSIS — I480 Paroxysmal atrial fibrillation: Secondary | ICD-10-CM | POA: Diagnosis not present

## 2019-09-06 DIAGNOSIS — I5022 Chronic systolic (congestive) heart failure: Secondary | ICD-10-CM | POA: Diagnosis not present

## 2019-09-06 DIAGNOSIS — U071 COVID-19: Secondary | ICD-10-CM | POA: Diagnosis not present

## 2019-09-06 DIAGNOSIS — J069 Acute upper respiratory infection, unspecified: Secondary | ICD-10-CM | POA: Diagnosis not present

## 2019-09-06 DIAGNOSIS — J9621 Acute and chronic respiratory failure with hypoxia: Secondary | ICD-10-CM | POA: Diagnosis not present

## 2019-09-06 DIAGNOSIS — J9622 Acute and chronic respiratory failure with hypercapnia: Secondary | ICD-10-CM | POA: Diagnosis not present

## 2019-09-06 LAB — COMPREHENSIVE METABOLIC PANEL
ALT: 21 U/L (ref 0–44)
AST: 17 U/L (ref 15–41)
Albumin: 3 g/dL — ABNORMAL LOW (ref 3.5–5.0)
Alkaline Phosphatase: 38 U/L (ref 38–126)
Anion gap: 9 (ref 5–15)
BUN: 59 mg/dL — ABNORMAL HIGH (ref 8–23)
CO2: 32 mmol/L (ref 22–32)
Calcium: 9.8 mg/dL (ref 8.9–10.3)
Chloride: 101 mmol/L (ref 98–111)
Creatinine, Ser: 0.78 mg/dL (ref 0.61–1.24)
GFR calc Af Amer: >60 mL/min (ref >60)
GFR calc non Af Amer: >60 mL/min (ref >60)
Glucose, Bld: 163 mg/dL — ABNORMAL HIGH (ref 70–99)
Potassium: 3.3 mmol/L — ABNORMAL LOW (ref 3.5–5.1)
Sodium: 142 mmol/L (ref 135–145)
Total Bilirubin: 0.8 mg/dL (ref 0.3–1.2)
Total Protein: 6.2 g/dL — ABNORMAL LOW (ref 6.5–8.1)

## 2019-09-06 LAB — GLUCOSE, CAPILLARY
Glucose-Capillary: 158 mg/dL — ABNORMAL HIGH (ref 70–99)
Glucose-Capillary: 145 mg/dL — ABNORMAL HIGH (ref 70–99)
Glucose-Capillary: 157 mg/dL — ABNORMAL HIGH (ref 70–99)

## 2019-09-06 LAB — CBC WITH DIFFERENTIAL/PLATELET
Abs Immature Granulocytes: 0.23 10*3/uL — ABNORMAL HIGH (ref 0.00–0.07)
Basophils Absolute: 0 10*3/uL (ref 0.0–0.1)
Basophils Relative: 0 %
Eosinophils Absolute: 0 10*3/uL (ref 0.0–0.5)
Eosinophils Relative: 0 %
HCT: 33.2 % — ABNORMAL LOW (ref 39.0–52.0)
Hemoglobin: 10.5 g/dL — ABNORMAL LOW (ref 13.0–17.0)
Immature Granulocytes: 1 %
Lymphocytes Relative: 7 %
Lymphs Abs: 1.4 10*3/uL (ref 0.7–4.0)
MCH: 31.3 pg (ref 26.0–34.0)
MCHC: 31.6 g/dL (ref 30.0–36.0)
MCV: 99.1 fL (ref 80.0–100.0)
Monocytes Absolute: 1.8 10*3/uL — ABNORMAL HIGH (ref 0.1–1.0)
Monocytes Relative: 9 %
Neutro Abs: 15.5 10*3/uL — ABNORMAL HIGH (ref 1.7–7.7)
Neutrophils Relative %: 83 %
Platelets: 382 10*3/uL (ref 150–400)
RBC: 3.35 MIL/uL — ABNORMAL LOW (ref 4.22–5.81)
RDW: 13.9 % (ref 11.5–15.5)
WBC: 19 10*3/uL — ABNORMAL HIGH (ref 4.0–10.5)
nRBC: 0.1 % (ref 0.0–0.2)

## 2019-09-06 LAB — D-DIMER, QUANTITATIVE: D-Dimer, Quant: 1.17 ug/mL-FEU — ABNORMAL HIGH (ref 0.00–0.50)

## 2019-09-06 LAB — NOVEL CORONAVIRUS, NAA (HOSP ORDER, SEND-OUT TO REF LAB; TAT 18-24 HRS): SARS-CoV-2, NAA: NOT DETECTED

## 2019-09-06 LAB — PHOSPHORUS: Phosphorus: 1.8 mg/dL — ABNORMAL LOW (ref 2.5–4.6)

## 2019-09-06 LAB — MAGNESIUM: Magnesium: 2.1 mg/dL (ref 1.7–2.4)

## 2019-09-06 LAB — PROTIME-INR
INR: 1.1 (ref 0.8–1.2)
Prothrombin Time: 14.5 seconds (ref 11.4–15.2)

## 2019-09-06 LAB — C-REACTIVE PROTEIN: CRP: <0.8 mg/dL (ref <1.0)

## 2019-09-06 LAB — FERRITIN: Ferritin: 84 ng/mL (ref 24–336)

## 2019-09-06 MED ORDER — PRO-STAT SUGAR FREE PO LIQD
60.0000 mL | Freq: Two times a day (BID) | ORAL | Status: DC
  Administered 2019-09-06 – 2019-09-15 (×19): 60 mL
  Filled 2019-09-06 (×19): qty 60

## 2019-09-06 MED ORDER — POTASSIUM PHOSPHATES 15 MMOLE/5ML IV SOLN
50.0000 meq | Freq: Once | INTRAVENOUS | Status: AC
  Administered 2019-09-06: 11:00:00 50 meq via INTRAVENOUS
  Filled 2019-09-06: qty 11.36

## 2019-09-06 MED ORDER — VITAL 1.5 CAL PO LIQD
1000.0000 mL | ORAL | Status: DC
  Administered 2019-09-06 – 2019-09-07 (×2): 1000 mL
  Filled 2019-09-06 (×11): qty 1000

## 2019-09-06 NOTE — Progress Notes (Signed)
Nutrition Follow-up  DOCUMENTATION CODES:   Obesity unspecified  INTERVENTION:   D/C Vital High Protein  Vital 1.5 @ 45 ml/hr (1080 ml/day) via PEG 60 ml Prostat BID   Provides: 2020 kcal, 132 grams protein, and 825 ml free water.   Encourage PO intake as able, will adjust enteral nutrition support as needed.   NUTRITION DIAGNOSIS:   Increased nutrient needs related to acute illness(COVID-19) as evidenced by estimated needs. Ongoing.   GOAL:   Provide needs based on ASPEN/SCCM guidelines Met.   MONITOR:   Vent status, TF tolerance, Labs, Weight trends  REASON FOR ASSESSMENT:   Ventilator, Consult Enteral/tube feeding initiation and management  ASSESSMENT:   68 y.o. male with medical history of trach-dependent respiratory failure, PEG tube, CHF, fibromyalgia/chronic pain, and afib presenting with COVID-19 infection. Patient was admitted to [facility] [place] on 7/15 after prolonged hospitalization at [facility] in [place] during which time he required trach and PEG placements. He was recently treated for HVAP at [place]. His sister preferred code status to be DNR.  Pt discussed during ICU rounds and with RN.  Pt passed SBT, trial TC today.  Diet advanced to Dysphagia 2 with thin liquids, per RN eating little. At this time will continue full nutrition support but adjust if PO intake increases.    Medications reviewed and include: colace, solumedrol, miralax Kphos 50 mEq x 1 IV Labs reviewed: K+3.3 (L), PO4: 1.8 (L)   TF: Vital High Protein @ 55 ml/hr with 60 ml prostat TID Provides 1920 kcal, 205 grams protein, and 1103 ml free water.   Diet Order:   Diet Order            DIET DYS 2 Room service appropriate? Yes; Fluid consistency: Thin  Diet effective now              EDUCATION NEEDS:   No education needs have been identified at this time  Skin:  Skin Assessment: Skin Integrity Issues: Skin Integrity Issues:: DTI, Unstageable DTI: R heel Unstageable:  buttocks  Last BM:  10/13  Height:   Ht Readings from Last 1 Encounters:  09/03/19 '6\' 2"'  (1.88 m)    Weight:   Wt Readings from Last 1 Encounters:  09/05/19 108.6 kg    Ideal Body Weight:  86.4 kg  BMI:  Body mass index is 30.74 kg/m.  Estimated Nutritional Needs:   Kcal:  2000-2400  Protein:  125-163 grams  Fluid:  >/= 2 L/day  Maylon Peppers RD, LDN, CNSC [phone] Pager [phone] After Hours Pager

## 2019-09-06 NOTE — Progress Notes (Addendum)
PROGRESS NOTE    Sergio Mitchell  QVZ:[id number] DOB: September 19, 1951 DOA: 09/02/2019 PCP: [name], MD   Brief Narrative:  68 yo Male PMHx TRACH-dependent respiratory failure with PEG tube; chronic systolic CHF; and afib presenting with COVID-19 infection.   The patient is a tragic one who appeared to be in reasonably good health other than fibromyalgia and chronic pain until he was admitted at [facility] on 5/25. He presented with CP and SOB and eventually was diagnosed with cardiogenic shock with EF 20-25%, recommended for AICD. However, he also developed septic shock from HCAP with BAL positive for H flu. He required trach/peg and was discharged to [facility]. He also had encephalopathy with anxiety and was treated by psychiatry. He was admitted to [facility] on 7/15 and has been tolerating some CPAP and T-bar trials. He was treated recently [facility] for HCAP and was sent to [facility] for evaluation; I consulted on him on 9/7 and he was stable for d/c back to [facility] at that time.  Returned to ED with COVID-19 positive labs and worsening hypoxia.  I spoke with his sister and brother-in-law. They had many excellent questions about treatments and prognosis and understand the severity of his condition.  His sister reports that he would prefer to be DNR at this time. [name] who is trach and Vent dependent from [facility] diagnosed with Covid 19 and sent to the er as they don't keep Covid pt. He seems to be at his baseline.   Subjective: 10/14 alert nods yes and no to questions appropriately, consumed approximately 25% of his breakfast.  Per RN staff patient has expressed he DOES not want to return to [place]:   Principal Problem:   Acute on chronic respiratory failure with hypoxia and hypercapnia (HCC) Active Problems:   Atrial fibrillation ([place])   Acute respiratory disease due to COVID-19 virus   Respiratory tract infection due to COVID-19 virus   Chronic systolic  CHF (congestive heart failure) (HCC)   Pressure injury of skin   AF (paroxysmal atrial fibrillation) (HCC)  Acute on chronic respiratory failure with hypoxia and hypercapnia - VAP 1 month prior to admission treated with cefepime and vancomycin.  Resolved patient was able to come off ventilator. - Worsening hypoxia starting 3 weeks prior to hospitalization.  Placed back on ventilator~2 weeks prior to hospitalization. - CTA; negative PE, LEFT sided atelectasis and inferior lung collapse (chronic) - Complete Remdesivir per pharmacy; stop date 10/14 - Solu-Medrol 55 mg BID stop date 10/19 - 10/11 COVID convalescent plasma x1 dose COVID-19 Labs  Recent Labs    09/04/19 0530 09/05/19 0546 09/06/19 0540  DDIMER 1.33* 1.14* 1.17*  FERRITIN 109 88 84  CRP <0.8 <0.8 <0.8   Lab Results  Component Value Date   SARSCOV2NAA NOT DETECTED 09/05/2019   [place] NEGATIVE 32/95/1884   Chronic systolic CHF - Strict in and out +1.1 L -Daily weight Filed Weights   09/02/19 1700 09/03/19 0500 09/05/19 0500  Weight: 110.2 kg 109.9 kg 108.6 kg  -Coreg 12.5 mg BID  A. fib rate controlled -10/14 currently NSR -See CHF  Acute metabolic encephalopathy -Resolved  Depression/anxiety - Klonopin, 1 mg TID -Lexapro, 10 mg daily -Seroquel 50 mg BID PRN  Hypokalemia -Potassium goal> 4 -Potassium phosphate 50 mEq  Hypophosphatemia -See hypokalemia   Goals of care - 10/14 social work consult; patient has expressed he does not want to return to [facility]   DVT prophylaxis: Lovenox Code Status: DNR  Family Communication: 10/14 spoke with sister and brother-in-law explained plan of care answered all questions Disposition Plan: TBD   Consultants:  PCCM    Procedures/Significant Events:     I have personally reviewed and interpreted all radiology studies and my findings are as above.  VENTILATOR SETTINGS:    Cultures   Antimicrobials: Anti-infectives (From admission, onward)    Start     Stop   09/03/19 1000  remdesivir 100 mg in sodium chloride 0.9 % 250 mL IVPB     09/06/19 1044   09/02/19 1000  remdesivir 200 mg in sodium chloride 0.9 % 250 mL IVPB     09/02/19 1115       Devices    LINES / TUBES:      Continuous Infusions: . sodium chloride Stopped (09/03/19 2130)  . feeding supplement (VITAL 1.5 CAL) 1,000 mL (09/06/19 1704)     Objective: Vitals:   09/06/19 1546 09/06/19 1600 09/06/19 1700 09/06/19 1800  BP:  102/62 113/68 109/90  Pulse: 69 67 78 72  Resp: (!) 88 _0 Temp:  98 F (36.7 C)    TempSrc:  Oral    SpO2: (!) 88% 91% 94% 93%  Weight:      Height:        Intake/Output Summary (Last 24 hours) at 09/06/2019 1812 Last data filed at 09/06/2019 1800 Gross per 24 hour  Intake 1844.12 ml  Output 1825 ml  Net 19.12 ml   Filed Weights   09/02/19 1700 09/03/19 0500 09/05/19 0500  Weight: 110.2 kg 109.9 kg 108.6 kg    Examination:  General: Alert, nods yes and no appropriately to questions, positive acute respiratory distress Eyes: negative scleral hemorrhage, negative anisocoria, negative icterus ENT: Negative Runny nose, negative gingival bleeding, Neck:  Negative scars, masses, torticollis, lymphadenopathy, JVD #7 trach in place negative sign of infection or discharge. Lungs: Clear to auscultation bilaterally without wheezes or crackles Cardiovascular: Regular rate and rhythm without murmur gallop or rub normal S1 and S2 Abdomen: negative abdominal pain, nondistended, positive soft, bowel sounds, no rebound, no ascites, no appreciable mass Extremities: No significant cyanosis, clubbing, or edema bilateral lower extremities Skin: Negative rashes, lesions, ulcers Psychiatric:  Negative depression, negative anxiety, negative fatigue, negative mania  Central nervous system:  Cranial nerves II through XII intact, tongue/uvula midline, all extremities muscle strength 5/5, sensation intact throughout, negative expressive  aphasia, negative receptive aphasia.  .     Data Reviewed: Care during the described time interval was provided by me .  I have reviewed this patient's available data, including medical history, events of note, physical examination, and all test results as part of my evaluation.   CBC: Recent Labs  Lab 09/02/19 0301 09/03/19 0600 09/04/19 0530 09/05/19 0546 09/06/19 0540  WBC 16.0* 18.7* 24.0* 19.0* 19.0*  NEUTROABS 12.4* 15.7* 21.6* 17.2* 15.5*  HGB 11.3* 10.1* 10.7* 10.7* 10.5*  HCT 34.9* 31.1* 32.4* 33.6* 33.2*  MCV 100.0 97.5 95.9 99.7 99.1  PLT 362 328 346 369 956   Basic Metabolic Panel: Recent Labs  Lab 09/02/19 0301 09/03/19 0600 09/03/19 1700 09/04/19 0530 09/05/19 0546 09/06/19 0540  NA 140 138  --  139 140 142  K 3.7 3.5  --  3.4* 3.7 3.3*  CL 95* 96*  --  97* 99 101  CO2 33* 32  --  30 31 32  GLUCOSE 111* 128*  --  150* 162* 163*  BUN 27* 37*  --  40* 58* 59*  CREATININE 0.77 0.86  --  0.80 0.82 0.78  CALCIUM 10.2 10.0  --  10.2 9.7 9.8  MG  --  2.0 2.0 2.0 2.0 2.1  PHOS  --  2.9 3.3 3.0 2.7 1.8*   GFR: Estimated Creatinine Clearance: 114.4 mL/min (by C-G formula based on SCr of 0.78 mg/dL). Liver Function Tests: Recent Labs  Lab 09/02/19 0301 09/03/19 0600 09/04/19 0530 09/05/19 0546 09/06/19 0540  AST _0 ALT _1 ALKPHOS 43 43 45 37* 38  BILITOT 0.3 0.9 0.3 0.7 0.8  PROT 6.9 6.4* 6.4* 6.2* 6.2*  ALBUMIN 3.2* 3.3* 3.0* 3.0* 3.0*   No results for input(s): LIPASE, AMYLASE in the last 168 hours. No results for input(s): AMMONIA in the last 168 hours. Coagulation Profile: Recent Labs  Lab 09/03/19 0600 09/04/19 0530 09/05/19 0546 09/06/19 0540  INR 1.1 1.1 1.1 1.1   Cardiac Enzymes: No results for input(s): CKTOTAL, CKMB, CKMBINDEX, TROPONINI in the last 168 hours. BNP (last 3 results) No results for input(s): PROBNP in the last 8760 hours. HbA1C: No results for input(s): HGBA1C in the last 72 hours. CBG:  Recent Labs  Lab 09/05/19 1545 09/05/19 1934 09/06/19 0815 09/06/19 1312 09/06/19 1602  GLUCAP 160* 142* 158* 145* 157*   Lipid Profile: No results for input(s): CHOL, HDL, LDLCALC, TRIG, CHOLHDL, LDLDIRECT in the last 72 hours. Thyroid Function Tests: No results for input(s): TSH, T4TOTAL, FREET4, T3FREE, THYROIDAB in the last 72 hours. Anemia Panel: Recent Labs    09/05/19 0546 09/06/19 0540  FERRITIN 88 84   Urine analysis:    Component Value Date/Time   COLORURINE RED (A) 09/02/2019 0301   APPEARANCEUR TURBID (A) 09/02/2019 0301   LABSPEC  09/02/2019 0301    TEST NOT REPORTED DUE TO COLOR INTERFERENCE OF URINE PIGMENT   PHURINE  09/02/2019 0301    TEST NOT REPORTED DUE TO COLOR INTERFERENCE OF URINE PIGMENT   GLUCOSEU NEGATIVE 09/02/2019 0301   HGBUR NEGATIVE 09/02/2019 0301   BILIRUBINUR NEGATIVE 09/02/2019 0301   KETONESUR NEGATIVE 09/02/2019 0301   PROTEINUR NEGATIVE 09/02/2019 0301   UROBILINOGEN 0.2 08/11/2010 1041   NITRITE NEGATIVE 09/02/2019 0301   LEUKOCYTESUR NEGATIVE 09/02/2019 0301   Sepsis Labs: _2 (procalcitonin:4,lacticidven:4)  ) Recent Results (from the past 240 hour(s))  MRSA PCR Screening     Status: Abnormal   Collection Time: 09/03/19  4:00 AM   Specimen: Nasal Mucosa; Nasopharyngeal  Result Value Ref Range Status   MRSA by PCR POSITIVE (A) NEGATIVE Final    Comment:        The GeneXpert MRSA Assay (FDA approved for NASAL specimens only), is one component of a comprehensive MRSA colonization surveillance program. It is not intended to diagnose MRSA infection nor to guide or monitor treatment for MRSA infections. RESULT CALLED TO, READ BACK BY AND VERIFIED WITH: JOBE,H RN _3  ON 09/03/2019 JACKSON,K Performed at [facility], [place] [address], [place], [place] [address]   Culture, respiratory (non-expectorated)     Status: None (Preliminary result)   Collection Time: 09/03/19  9:18 AM   Specimen:  Tracheal Aspirate; Respiratory  Result Value Ref Range Status   Specimen Description   Final    TRACHEAL ASPIRATE Performed at [place] [address] [place], [place] [address]    Special Requests   Final    NONE Performed at [facility], [place] [address], [place], [place] [address]  Gram Stain NO WBC SEEN RARE GRAM POSITIVE COCCI   Final   Culture   Final    CULTURE REINCUBATED FOR BETTER GROWTH Performed at [facility], [place] [address] [place], [place] [address]    Report Status PENDING  Incomplete  Novel Coronavirus, NAA (Hosp order, Send-out to Ref Lab; TAT 18-24 hrs     Status: None   Collection Time: 09/05/19  1:35 PM   Specimen: Nasopharyngeal Swab; Respiratory  Result Value Ref Range Status   SARS-CoV-2, NAA NOT DETECTED NOT DETECTED Final    Comment: (NOTE) This nucleic acid amplification test was developed and its performance characteristics determined by [employer]. Nucleic acid amplification tests include PCR and TMA. This test has not been FDA cleared or approved. This test has been authorized by FDA under an Emergency Use Authorization (EUA). This test is only authorized for the duration of time the declaration that circumstances exist justifying the authorization of the emergency use of in vitro diagnostic tests for detection of SARS-CoV-2 virus and/or diagnosis of COVID-19 infection under section 564(b)(1) of the Act, 21 U.S.C. 160VPX-1(G) (1), unless the authorization is terminated or revoked sooner. When diagnostic testing is negative, the possibility of a false negative result should be considered in the context of a patient's recent exposures and the presence of clinical signs and symptoms consistent with COVID-19. An individual without symptoms of COVID- 19 and who is not shedding SARS-CoV-2 vi rus would expect to have a negative (not detected) result in this assay. Performed At: [facility] [place], [place] [id number] Rush Farmer MD EV:[id number]    [place]  Final    Comment: Performed at [place] [address], [place], [place] [address]         Radiology Studies: Dg Chest Port 1 View  Result Date: 09/05/2019 CLINICAL DATA:  Tracheostomy. EXAM: PORTABLE CHEST 1 VIEW COMPARISON:  CT 09/03/2019.  Chest x-ray 09/02/2019. FINDINGS: Tracheostomy tube in stable position. Heart size stable. Mild left upper lobe infiltrate, best identified by recent CT. No right upper lung infiltrate identified as noted on recent CT. Bibasilar atelectasis/infiltrates again noted. Small left pleural effusion. No pneumothorax. IMPRESSION: 1.  Tracheostomy tube in stable position. 2. Mild left upper lobe infiltrate, best identified by recent CT. No right upper lung infiltrate identified as noted on recent CT. Bibasilar atelectasis/infiltrates again noted. Small left pleural effusion. Electronically Signed   By: [place]   On: 09/05/2019 08:17        Scheduled Meds: . sodium chloride   Intravenous Once  . aspirin  81 mg Per Tube Daily  . atorvastatin  20 mg Per Tube Daily  . carvedilol  12.5 mg Per Tube BID WC  . chlorhexidine gluconate (MEDLINE KIT)  15 mL Mouth Rinse BID  . Chlorhexidine Gluconate Cloth  6 each Topical Daily  . clonazePAM  1 mg Per Tube Q8H  . docusate  100 mg Per Tube Daily  . enoxaparin (LOVENOX) injection  40 mg Subcutaneous Q24H  . escitalopram  10 mg Per Tube Daily  . famotidine  20 mg Oral QHS  . feeding supplement (PRO-STAT SUGAR FREE 64)  60 mL Per Tube BID  . ipratropium-albuterol  3 mL Nebulization BID  . methylPREDNISolone (SOLU-MEDROL) injection  55 mg Intravenous Q12H  . morphine  15 mg Per Tube Q8H  . mupirocin ointment   Nasal BID  . polyethylene glycol  17 g Per Tube Daily  .  sodium chloride flush  3 mL Intravenous Q12H  . sodium chloride flush  3 mL Intravenous Q12H    Continuous Infusions: . sodium chloride Stopped (09/03/19 2130)  . feeding supplement (VITAL 1.5 CAL) 1,000 mL (09/06/19 1704)     LOS: 4 days   The patient is critically ill with multiple organ systems failure and requires high complexity decision making for assessment and support, frequent evaluation and titration of therapies, application of advanced monitoring technologies and extensive interpretation of multiple databases. Critical Care Time devoted to patient care services described in this note  Time spent: 40 minutes     , Geraldo Docker, MD [facility] Pager [phone]  If 7PM-7AM, please contact night-coverage [email or web address] Password [email or web address] 09/06/2019, 6:12 PM

## 2019-09-06 NOTE — Progress Notes (Signed)
NAME:  Sergio Mitchell, MRN:  [id number], DOB:  Nov 04, 1951, LOS: 4 ADMISSION DATE:  09/02/2019, CONSULTATION DATE:  10/10 REFERRING MD:  Lorin Mercy, CHIEF COMPLAINT:  Hypoxemia   Brief History   68 y/o male admitted from [place] to [facility] on 10/10 for hypoxemia due to COVID 19 pneumonia.  At baseline the patient has ventilator dependent respiratory failure and systolic heart failure.   Past Medical History  Chronic respiratory failure with hypoxemia, ventilator dependent Tracheostomy status Chronic systolic heart faiulre Fibromyalgia Chronic pain Osteoporosis Scoliosis Spinal stenosis  Significant Hospital Events   10/10 admission 10/11 worsening confusion, hypoxemia  Consults:  PCCM  Procedures:  Tracheostomy, chronic (May 2020?)  Significant Diagnostic Tests:    Micro Data:  10/7 SARS COV 2 Positive [facility]) 10/11 resp culture > rare GPC  Antimicrobials/COVID Treatment  10/10 Convalescent plasma > 10/10 decadron >  10/10 remdesivir >    Interim history/subjective:   Improved oxygenation Passing SBT  Objective   Blood pressure 125/67, pulse 82, temperature (!) 97.5 F (36.4 C), temperature source Axillary, resp. rate (!) 30, height 6\' 2"  (1.88 m), weight 108.6 kg, SpO2 99 %.    Vent Mode: PRVC FiO2 (%):  [40 %-50 %] 40 % Set Rate:  [24 bmp] 24 bmp Vt Set:  [500 mL] 500 mL PEEP:  [8 cmH20] 8 cmH20 Plateau Pressure:  [12 YIR48-54 cmH20] 12 cmH20   Intake/Output Summary (Last 24 hours) at 09/06/2019 0755 Last data filed at 09/06/2019 0500 Gross per 24 hour  Intake 1435.17 ml  Output 1725 ml  Net -289.83 ml   Filed Weights   09/02/19 1700 09/03/19 0500 09/05/19 0500  Weight: 110.2 kg 109.9 kg 108.6 kg    Examination:  General:  In bed on vent HENT: NCAT ETT in place PULM: CTA B, vent supported breathing CV: RRR, no mgr GI: BS+, soft, nontender MSK: normal bulk and tone Neuro: sedated on vent  10/14 CXR > pulmonary parenchymal largely clear,  tracheostomy in place  Resolved Hospital Problem list     Assessment & Plan:  Acute on chronic respiratory failure with hypoxemia due to COVID 19 pneumonia, generalized weakness, chronic systolic heart failure (not clearly ARDS): improving 10/14 Pressure support weaning as able today, consider tracheostomy collar trial Decadron 10 day course remdesivir 5 day course Trach care per routine  Chronic systolic heart failure Tele Continue coreg Monitor I/O and hemodynamics  Physical deconditioning PT/OT consult as able  Best practice:  Diet: tube feeding, allow to take food by mouth as well Pain/Anxiety/Delirium protocol (if indicated): no VAP protocol (if indicated): yes DVT prophylaxis: Lovenox low dose per COVID PACT Trial GI prophylaxis: lansoprazole for stress ulcer prophylaxis Glucose control: SSI Mobility: bed rest Code Status: DNR Family Communication: per [facility] Disposition: remain in ICU  Labs   CBC: Recent Labs  Lab 09/02/19 0301 09/03/19 0600 09/04/19 0530 09/05/19 0546 09/06/19 0540  WBC 16.0* 18.7* 24.0* 19.0* 19.0*  NEUTROABS 12.4* 15.7* 21.6* 17.2* 15.5*  HGB 11.3* 10.1* 10.7* 10.7* 10.5*  HCT 34.9* 31.1* 32.4* 33.6* 33.2*  MCV 100.0 97.5 95.9 99.7 99.1  PLT 362 328 346 369 627    Basic Metabolic Panel: Recent Labs  Lab 09/02/19 0301 09/03/19 0600 09/03/19 1700 09/04/19 0530 09/05/19 0546 09/06/19 0540  NA 140 138  --  139 140 142  K 3.7 3.5  --  3.4* 3.7 3.3*  CL 95* 96*  --  97* 99 101  CO2 33* 32  --  30 31  32  GLUCOSE 111* 128*  --  150* 162* 163*  BUN 27* 37*  --  40* 58* 59*  CREATININE 0.77 0.86  --  0.80 0.82 0.78  CALCIUM 10.2 10.0  --  10.2 9.7 9.8  MG  --  2.0 2.0 2.0 2.0 2.1  PHOS  --  2.9 3.3 3.0 2.7 1.8*   GFR: Estimated Creatinine Clearance: 114.4 mL/min (by C-G formula based on SCr of 0.78 mg/dL). Recent Labs  Lab 09/02/19 0301 09/02/19 0324 09/03/19 0600 09/04/19 0530 09/05/19 0546 09/06/19 0540  PROCALCITON  --   <0.10  --   --   --   --   WBC 16.0*  --  18.7* 24.0* 19.0* 19.0*  LATICACIDVEN 1.0  --   --   --   --   --     Liver Function Tests: Recent Labs  Lab 09/02/19 0301 09/03/19 0600 09/04/19 0530 09/05/19 0546 09/06/19 0540  AST 23 23 18 18 17   ALT 25 26 22 21 21   ALKPHOS 43 43 45 37* 38  BILITOT 0.3 0.9 0.3 0.7 0.8  PROT 6.9 6.4* 6.4* 6.2* 6.2*  ALBUMIN 3.2* 3.3* 3.0* 3.0* 3.0*   No results for input(s): LIPASE, AMYLASE in the last 168 hours. No results for input(s): AMMONIA in the last 168 hours.  ABG    Component Value Date/Time   PHART 7.485 (H) 09/02/2019 0234   PCO2ART 51.2 (H) 09/02/2019 0234   PO2ART 152.0 (H) 09/02/2019 0234   HCO3 38.3 (H) 09/02/2019 0234   TCO2 40 (H) 09/02/2019 0234   O2SAT 99.0 09/02/2019 0234     Coagulation Profile: Recent Labs  Lab 09/03/19 0600 09/04/19 0530 09/05/19 0546  INR 1.1 1.1 1.1    Cardiac Enzymes: No results for input(s): CKTOTAL, CKMB, CKMBINDEX, TROPONINI in the last 168 hours.  HbA1C: No results found for: HGBA1C  CBG: Recent Labs  Lab 09/05/19 0439 09/05/19 0800 09/05/19 1133 09/05/19 1545 09/05/19 1934  GLUCAP 178* 122* 128* 160* 142*      Critical care time: 31 minutes     09/07/19, MD [place] [facility] Pager: [phone] Cell: [phone] If no response, call [phone]

## 2019-09-06 NOTE — Progress Notes (Signed)
Spoke with patient's sister, Fraser Din, and provided full update/answered all questions.

## 2019-09-07 DIAGNOSIS — Z9911 Dependence on respirator [ventilator] status: Secondary | ICD-10-CM

## 2019-09-07 DIAGNOSIS — I480 Paroxysmal atrial fibrillation: Secondary | ICD-10-CM

## 2019-09-07 DIAGNOSIS — J9621 Acute and chronic respiratory failure with hypoxia: Secondary | ICD-10-CM | POA: Diagnosis not present

## 2019-09-07 DIAGNOSIS — I5022 Chronic systolic (congestive) heart failure: Secondary | ICD-10-CM | POA: Diagnosis not present

## 2019-09-07 DIAGNOSIS — I4891 Unspecified atrial fibrillation: Secondary | ICD-10-CM | POA: Diagnosis not present

## 2019-09-07 DIAGNOSIS — U071 COVID-19: Secondary | ICD-10-CM | POA: Diagnosis not present

## 2019-09-07 LAB — CULTURE, RESPIRATORY W GRAM STAIN
Culture: NORMAL
Gram Stain: NONE SEEN

## 2019-09-07 LAB — GLUCOSE, CAPILLARY
Glucose-Capillary: 182 mg/dL — ABNORMAL HIGH (ref 70–99)
Glucose-Capillary: 157 mg/dL — ABNORMAL HIGH (ref 70–99)
Glucose-Capillary: 136 mg/dL — ABNORMAL HIGH (ref 70–99)
Glucose-Capillary: 162 mg/dL — ABNORMAL HIGH (ref 70–99)
Glucose-Capillary: 166 mg/dL — ABNORMAL HIGH (ref 70–99)

## 2019-09-07 LAB — COMPREHENSIVE METABOLIC PANEL
ALT: 22 U/L (ref 0–44)
AST: 17 U/L (ref 15–41)
Albumin: 2.9 g/dL — ABNORMAL LOW (ref 3.5–5.0)
Alkaline Phosphatase: 38 U/L (ref 38–126)
Anion gap: 9 (ref 5–15)
BUN: 54 mg/dL — ABNORMAL HIGH (ref 8–23)
CO2: 30 mmol/L (ref 22–32)
Calcium: 9.6 mg/dL (ref 8.9–10.3)
Chloride: 102 mmol/L (ref 98–111)
Creatinine, Ser: 0.75 mg/dL (ref 0.61–1.24)
GFR calc Af Amer: >60 mL/min (ref >60)
GFR calc non Af Amer: >60 mL/min (ref >60)
Glucose, Bld: 205 mg/dL — ABNORMAL HIGH (ref 70–99)
Potassium: 3.5 mmol/L (ref 3.5–5.1)
Sodium: 141 mmol/L (ref 135–145)
Total Bilirubin: 0.5 mg/dL (ref 0.3–1.2)
Total Protein: 6.2 g/dL — ABNORMAL LOW (ref 6.5–8.1)

## 2019-09-07 LAB — CBC WITH DIFFERENTIAL/PLATELET
Abs Immature Granulocytes: 0.41 10*3/uL — ABNORMAL HIGH (ref 0.00–0.07)
Basophils Absolute: 0 10*3/uL (ref 0.0–0.1)
Basophils Relative: 0 %
Eosinophils Absolute: 0 10*3/uL (ref 0.0–0.5)
Eosinophils Relative: 0 %
HCT: 35 % — ABNORMAL LOW (ref 39.0–52.0)
Hemoglobin: 11 g/dL — ABNORMAL LOW (ref 13.0–17.0)
Immature Granulocytes: 2 %
Lymphocytes Relative: 9 %
Lymphs Abs: 1.8 10*3/uL (ref 0.7–4.0)
MCH: 31.4 pg (ref 26.0–34.0)
MCHC: 31.4 g/dL (ref 30.0–36.0)
MCV: 100 fL (ref 80.0–100.0)
Monocytes Absolute: 2 10*3/uL — ABNORMAL HIGH (ref 0.1–1.0)
Monocytes Relative: 10 %
Neutro Abs: 16.4 10*3/uL — ABNORMAL HIGH (ref 1.7–7.7)
Neutrophils Relative %: 79 %
Platelets: 392 10*3/uL (ref 150–400)
RBC: 3.5 MIL/uL — ABNORMAL LOW (ref 4.22–5.81)
RDW: 14.3 % (ref 11.5–15.5)
WBC: 20.7 10*3/uL — ABNORMAL HIGH (ref 4.0–10.5)
nRBC: 0.1 % (ref 0.0–0.2)

## 2019-09-07 LAB — PHOSPHORUS: Phosphorus: 1.9 mg/dL — ABNORMAL LOW (ref 2.5–4.6)

## 2019-09-07 LAB — C-REACTIVE PROTEIN: CRP: <0.8 mg/dL (ref <1.0)

## 2019-09-07 LAB — PROTIME-INR
INR: 1.1 (ref 0.8–1.2)
Prothrombin Time: 14 seconds (ref 11.4–15.2)

## 2019-09-07 LAB — MAGNESIUM: Magnesium: 2.2 mg/dL (ref 1.7–2.4)

## 2019-09-07 LAB — D-DIMER, QUANTITATIVE: D-Dimer, Quant: 1.12 ug/mL-FEU — ABNORMAL HIGH (ref 0.00–0.50)

## 2019-09-07 LAB — FERRITIN: Ferritin: 80 ng/mL (ref 24–336)

## 2019-09-07 MED ORDER — FUROSEMIDE 10 MG/ML IJ SOLN
40.0000 mg | Freq: Four times a day (QID) | INTRAMUSCULAR | Status: AC
  Administered 2019-09-07 (×2): 40 mg via INTRAVENOUS
  Filled 2019-09-07 (×2): qty 4

## 2019-09-07 MED ORDER — POTASSIUM CHLORIDE 20 MEQ/15ML (10%) PO SOLN
50.0000 meq | Freq: Once | ORAL | Status: AC
  Administered 2019-09-07: 50 meq via ORAL
  Filled 2019-09-07: qty 45

## 2019-09-07 NOTE — Plan of Care (Addendum)
  Problem: Respiratory: Goal: Will maintain a patent airway Outcome: Progressing Goal: Complications related to the disease process, condition or treatment will be avoided or minimized Outcome: Progressing   Problem: Education: Goal: Knowledge of General Education information will improve Description: Including pain rating scale, medication(s)/side effects and non-pharmacologic comfort measures Outcome: Progressing   Problem: Health Behavior/Discharge Planning: Goal: Ability to manage health-related needs will improve Outcome: Progressing   Problem: Clinical Measurements: Goal: Ability to maintain clinical measurements within normal limits will improve Outcome: Progressing Goal: Will remain free from infection Outcome: Progressing Goal: Diagnostic test results will improve Outcome: Progressing Goal: Respiratory complications will improve Outcome: Progressing Goal: Cardiovascular complication will be avoided Outcome: Progressing   Problem: Activity: Goal: Risk for activity intolerance will decrease Outcome: Progressing   Problem: Nutrition: Goal: Adequate nutrition will be maintained Outcome: Progressing   Problem: Coping: Goal: Level of anxiety will decrease Outcome: Progressing   Problem: Elimination: Goal: Will not experience complications related to bowel motility Outcome: Progressing Goal: Will not experience complications related to urinary retention Outcome: Progressing   Problem: Pain Managment: Goal: General experience of comfort will improve Outcome: Progressing   Problem: Safety: Goal: Ability to remain free from injury will improve Outcome: Progressing   Problem: Skin Integrity: Goal: Risk for impaired skin integrity will decrease Outcome: Progressing   

## 2019-09-07 NOTE — Progress Notes (Signed)
NAME:  Sergio Mitchell, MRN:  [id number], DOB:  1951-04-26, LOS: 5 ADMISSION DATE:  09/02/2019, CONSULTATION DATE:  10/10 REFERRING MD:  Ophelia Charter, CHIEF COMPLAINT:  Hypoxemia   Brief History   68 y/o male admitted from [facility] to [facility] on 10/10 for hypoxemia due to COVID 19 pneumonia.  At baseline the patient has ventilator dependent respiratory failure and systolic heart failure.   Past Medical History  Chronic respiratory failure with hypoxemia, ventilator dependent Tracheostomy status Chronic systolic heart faiulre Fibromyalgia Chronic pain Osteoporosis Scoliosis Spinal stenosis  Significant Hospital Events   10/10 admission 10/11 worsening confusion, hypoxemia  Consults:  PCCM  Procedures:  Tracheostomy, chronic (May 2020?)  Significant Diagnostic Tests:    Micro Data:  10/7 SARS COV 2 Positive [facility]) 10/11 resp culture > rare [facility]  Antimicrobials/COVID Treatment  10/10 Convalescent plasma > 10/10 decadron >  10/10 remdesivir >  10/14  Interim history/subjective:   Tolerated tracheostomy collar for 2 hours yesterday, 4 hours this morning  Objective   Blood pressure (!) 141/80, pulse 71, temperature 98.6 F (37 C), temperature source Oral, resp. rate (!) 23, height 6\' 2"  (1.88 m), weight 108.6 kg, SpO2 91 %.    Vent Mode: PRVC FiO2 (%):  [40 %] 40 % Set Rate:  [24 bmp] 24 bmp Vt Set:  [500 mL] 500 mL PEEP:  [5 cmH20-8 cmH20] 8 cmH20 Pressure Support:  [5 cmH20] 5 cmH20 Plateau Pressure:  [15 cmH20-16 cmH20] 15 cmH20   Intake/Output Summary (Last 24 hours) at 09/07/2019 0803 Last data filed at 09/07/2019 0700 Gross per 24 hour  Intake 2234.12 ml  Output 1075 ml  Net 1159.12 ml   Filed Weights   09/02/19 1700 09/03/19 0500 09/05/19 0500  Weight: 110.2 kg 109.9 kg 108.6 kg    Examination:  General:  Resting comfortably in bed HENT: NCAT tracheostomy in place PULM: Rhonchi bases bilaterally, normal effort CV: RRR, no mgr GI: BS+, soft,  nontender MSK: normal bulk and tone Neuro: awake, alert, no distress, MAEW  10/14 CXR > pulmonary parenchymal largely clear, tracheostomy in place  Resolved Hospital Problem list     Assessment & Plan:  Acute on chronic respiratory failure with hypoxemia due to COVID 19 pneumonia, generalized weakness, chronic systolic heart failure (not clearly ARDS): improving 10/14 Acute pulmonary edema 10/15, new problem, due to decompensated systolic heart failure Furosemide x2 doses now Back to full support Decadron 10 day course Remdesivir completed Tracheostomy care per routine  Acute on Chronic systolic heart failure Tele Continue coreg Monitor I/O and hemodynamics Furosemide today  Physical deconditioning PT/OT consult as able   Best practice:  Diet: tube feeding, allow to take food by mouth as well Pain/Anxiety/Delirium protocol (if indicated): no VAP protocol (if indicated): yes DVT prophylaxis: Lovenox low dose per COVID PACT Trial GI prophylaxis: lansoprazole for stress ulcer prophylaxis Glucose control: SSI Mobility: bed rest Code Status: DNR Family Communication: per [facility] Disposition: remain in ICU  Labs   CBC: Recent Labs  Lab 09/02/19 0301 09/03/19 0600 09/04/19 0530 09/05/19 0546 09/06/19 0540  WBC 16.0* 18.7* 24.0* 19.0* 19.0*  NEUTROABS 12.4* 15.7* 21.6* 17.2* 15.5*  HGB 11.3* 10.1* 10.7* 10.7* 10.5*  HCT 34.9* 31.1* 32.4* 33.6* 33.2*  MCV 100.0 97.5 95.9 99.7 99.1  PLT 362 328 346 369 382    Basic Metabolic Panel: Recent Labs  Lab 09/02/19 0301 09/03/19 0600 09/03/19 1700 09/04/19 0530 09/05/19 0546 09/06/19 0540  NA 140 138  --  139 140 142  K 3.7 3.5  --  3.4* 3.7 3.3*  CL 95* 96*  --  97* 99 101  CO2 33* 32  --  30 31 32  GLUCOSE 111* 128*  --  150* 162* 163*  BUN 27* 37*  --  40* 58* 59*  CREATININE 0.77 0.86  --  0.80 0.82 0.78  CALCIUM 10.2 10.0  --  10.2 9.7 9.8  MG  --  2.0 2.0 2.0 2.0 2.1  PHOS  --  2.9 3.3 3.0 2.7 1.8*   GFR:  Estimated Creatinine Clearance: 114.4 mL/min (by C-G formula based on SCr of 0.78 mg/dL). Recent Labs  Lab 09/02/19 0301 09/02/19 0324 09/03/19 0600 09/04/19 0530 09/05/19 0546 09/06/19 0540  PROCALCITON  --  <0.10  --   --   --   --   WBC 16.0*  --  18.7* 24.0* 19.0* 19.0*  LATICACIDVEN 1.0  --   --   --   --   --     Liver Function Tests: Recent Labs  Lab 09/02/19 0301 09/03/19 0600 09/04/19 0530 09/05/19 0546 09/06/19 0540  AST 23 23 18 18 17   ALT 25 26 22 21 21   ALKPHOS 43 43 45 37* 38  BILITOT 0.3 0.9 0.3 0.7 0.8  PROT 6.9 6.4* 6.4* 6.2* 6.2*  ALBUMIN 3.2* 3.3* 3.0* 3.0* 3.0*   No results for input(s): LIPASE, AMYLASE in the last 168 hours. No results for input(s): AMMONIA in the last 168 hours.  ABG    Component Value Date/Time   PHART 7.485 (H) 09/02/2019 0234   PCO2ART 51.2 (H) 09/02/2019 0234   PO2ART 152.0 (H) 09/02/2019 0234   HCO3 38.3 (H) 09/02/2019 0234   TCO2 40 (H) 09/02/2019 0234   O2SAT 99.0 09/02/2019 0234     Coagulation Profile: Recent Labs  Lab 09/03/19 0600 09/04/19 0530 09/05/19 0546 09/06/19 0540  INR 1.1 1.1 1.1 1.1    Cardiac Enzymes: No results for input(s): CKTOTAL, CKMB, CKMBINDEX, TROPONINI in the last 168 hours.  HbA1C: No results found for: HGBA1C  CBG: Recent Labs  Lab 09/05/19 1934 09/06/19 0815 09/06/19 1312 09/06/19 1602 09/07/19 0527  GLUCAP 142* 158* 145* 157* 182*      Critical care time: 35 minutes     Roselie Awkward, MD [facility] Pager: [phone] Cell: [phone] If no response, call [phone]

## 2019-09-07 NOTE — Progress Notes (Signed)
   09/07/19 1153  Vent Select  Invasive or Noninvasive Invasive  Adult Vent Y  Tracheostomy Shiley 7 mm Cuffed;Proximal;Other (Comment)  No Placement Date or Time found.   Inserted prior to hospital arrival?: Yes  Brand: Shiley  Size (mm): 7 mm  Style: (c) Cuffed;Proximal;Other (Comment)  Status Secured  Site Assessment Clean;Dry  Ties Assessment Clean;Dry;Secure  Tracheostomy Equipment at bedside Yes and checklist posted at head of bed  Adult Ventilator Settings  Vent Type Servo i  Humidity HME  Vent Mode PRVC  Vt Set 500 mL  Set Rate 24 bmp  FiO2 (%) 100 %  I Time 0.8 Sec(s)  PEEP 12 cmH20  Adult Ventilator Measurements  Peak Airway Pressure 27 L/min  Mean Airway Pressure 17 cmH20  Plateau Pressure 22 cmH20  Resp Rate Spontaneous 0 br/min  Resp Rate Total 24 br/min  Exhaled Vt 455 mL  Measured Ve 12.4 mL  I:E Ratio Measured 1:2.1  SpO2 96 %  Adult Ventilator Alarms  Alarms On Y  Breath Sounds  Bilateral Breath Sounds Diminished  Airway Suctioning/Secretions  Suction Type Tracheal  Secretion Amount Moderate  Secretion Color Tan  Secretion Consistency Thin;Frothy  Suction Tolerance Tolerated well  Suctioning Adverse Effects None   Placed patient back on above settings from trach collar due to decreased SpO2 and increased frothy secretions.  Patient complaining of more increased work of breathing.  Patient tolerating changes well.

## 2019-09-07 NOTE — Progress Notes (Signed)
Pt's sister called in to unit. Updated of pt condition and plan of care. Pt's sister appreciative of update.

## 2019-09-07 NOTE — Progress Notes (Signed)
PROGRESS NOTE    Sergio Mitchell  XIH:[id number] DOB: [birth year] DOA: 09/02/2019 PCP: [name], MD   Brief Narrative:  68 yo Male PMHx TRACH-dependent respiratory failure with PEG tube; chronic systolic CHF; and afib presenting with COVID-19 infection.   The patient is a tragic one who appeared to be in reasonably good health other than fibromyalgia and chronic pain until he was admitted at [facility] on 5/25. He presented with CP and SOB and eventually was diagnosed with cardiogenic shock with EF 20-25%, recommended for AICD. However, he also developed septic shock from HCAP with BAL positive for H flu. He required trach/peg and was discharged to [facility]. He also had encephalopathy with anxiety and was treated by psychiatry. He was admitted to [facility] on 7/15 and has been tolerating some CPAP and T-bar trials. He was treated recently [facility] for HCAP and was sent to [facility] for evaluation; I consulted on him on 9/7 and he was stable for d/c back to [facility] at that time.  Returned to ED with COVID-19 positive labs and worsening hypoxia.  I spoke with his sister and brother-in-law. They had many excellent questions about treatments and prognosis and understand the severity of his condition.  His sister reports that he would prefer to be DNR at this time. [name] who is trach and Vent dependent from [facility] diagnosed with Covid 19 and sent to the er as they don't keep Covid pt. He seems to be at his baseline.   Subjective: 10/15 nods yes and no to questions appropriately.  States does not want to return to [place]:   Principal Problem:   Acute on chronic respiratory failure with hypoxia and hypercapnia (HCC) Active Problems:   Atrial fibrillation (HCC)   Acute respiratory disease due to COVID-19 virus   Respiratory tract infection due to COVID-19 virus   Chronic systolic CHF (congestive heart failure) (HCC)   Pressure injury of skin   AF  (paroxysmal atrial fibrillation) (HCC)  Acute on chronic respiratory failure with hypoxia and hypercapnia - VAP 1 month prior to admission treated with cefepime and vancomycin.  Resolved patient was able to come off ventilator. - Worsening hypoxia starting 3 weeks prior to hospitalization.  Placed back on ventilator~2 weeks prior to hospitalization. - CTA; negative PE, LEFT sided atelectasis and inferior lung collapse (chronic) - Complete Remdesivir per pharmacy; stop date 10/14 - Solu-Medrol 55 mg BID stop date 10/19 - 10/11 COVID convalescent plasma x1 dose -10/15 patient with frothy clear discharge from trach receiving Lasix today.  If continued in a.m. consider adding Robinul -10/15 tolerated trach collar for~6 hours  COVID-19 Labs  Recent Labs    09/05/19 0546 09/06/19 0540  DDIMER 1.14* 1.17*  FERRITIN 88 84  CRP <0.8 <0.8   Lab Results  Component Value Date   SARSCOV2NAA NOT DETECTED 09/05/2019   [place] NEGATIVE 34/91/7915   Chronic systolic CHF - Strict in and out +1.1 L -Daily weight Filed Weights   09/02/19 1700 09/03/19 0500 09/05/19 0500  Weight: 110.2 kg 109.9 kg 108.6 kg  -Coreg 12.5 mg BID -10/15 Lasix 40 mg x 2 dose  A. fib rate controlled -10/14 currently NSR -See CHF  Acute metabolic encephalopathy -Resolved  Depression/anxiety - Klonopin, 1 mg TID -Lexapro, 10 mg daily -Seroquel 50 mg BID PRN  Hypokalemia -Potassium goal> 4 -Potassium p.o. 50 mEq  Hypophosphatemia -See hypokalemia   Goals of care - 10/14 social work consult; patient has expressed  he does not want to return to [facility]   DVT prophylaxis: Lovenox Code Status: DNR Family Communication: 10/15 called sister, no one picked up the phone left message that I had attempted to update them on plan of care.  Disposition Plan: TBD   Consultants:  PCCM    Procedures/Significant Events:  Vent mode; PRVC Set rate; 24 Vt set; 500 FiO2; 60% PEEP; 5   I have  personally reviewed and interpreted all radiology studies and my findings are as above.  VENTILATOR SETTINGS:    Cultures   Antimicrobials: Anti-infectives (From admission, onward)   Start     Stop   09/03/19 1000  remdesivir 100 mg in sodium chloride 0.9 % 250 mL IVPB     09/06/19 1044   09/02/19 1000  remdesivir 200 mg in sodium chloride 0.9 % 250 mL IVPB     09/02/19 1115       Devices    LINES / TUBES:      Continuous Infusions: . sodium chloride Stopped (09/03/19 2130)  . feeding supplement (VITAL 1.5 CAL) 1,000 mL (09/06/19 1704)     Objective: Vitals:   09/07/19 0600 09/07/19 0700 09/07/19 0757 09/07/19 0800  BP: 132/72 (!) 141/80 (!) 141/80 132/82  Pulse: 68 71 72 72  Resp: (!) 24 (!) 23 (!) 24 (!) 24  Temp:    99.6 F (37.6 C)  TempSrc:    Oral  SpO2: 93% 91% 91% (!) 88%  Weight:      Height:        Intake/Output Summary (Last 24 hours) at 09/07/2019 0834 Last data filed at 09/07/2019 0700 Gross per 24 hour  Intake 2234.12 ml  Output 1075 ml  Net 1159.12 ml   Filed Weights   09/02/19 1700 09/03/19 0500 09/05/19 0500  Weight: 110.2 kg 109.9 kg 108.6 kg   Physical Exam:  General: Alert, nods yes and no appropriately to questions, positive acute respiratory distress Eyes: negative scleral hemorrhage, negative anisocoria, negative icterus ENT: Negative Runny nose, negative gingival bleeding,  Neck:  Negative scars, masses, torticollis, lymphadenopathy, JVD, #7 trach in place negative signs of infection.  Clear frothy discharge from trach.  Lungs: Clear to auscultation bilaterally without wheezes or crackles Cardiovascular: Regular rate and rhythm without murmur gallop or rub normal S1 and S2 Abdomen: negative abdominal pain, nondistended, positive soft, bowel sounds, no rebound, no ascites, no appreciable mass Extremities: No significant cyanosis, clubbing, or edema bilateral lower extremities Skin: Negative rashes, lesions, ulcers  Psychiatric:  Negative depression, negative anxiety, negative fatigue, negative mania  Central nervous system:  Cranial nerves II through XII intact, tongue/uvula midline, all extremities muscle strength 5/5, sensation intact throughout, negative expressive aphasia, negative receptive aphasia. .     Data Reviewed: Care during the described time interval was provided by me .  I have reviewed this patient's available data, including medical history, events of note, physical examination, and all test results as part of my evaluation.   CBC: Recent Labs  Lab 09/03/19 0600 09/04/19 0530 09/05/19 0546 09/06/19 0540 09/07/19 0555  WBC 18.7* 24.0* 19.0* 19.0* 20.7*  NEUTROABS 15.7* 21.6* 17.2* 15.5* 16.4*  HGB 10.1* 10.7* 10.7* 10.5* 11.0*  HCT 31.1* 32.4* 33.6* 33.2* 35.0*  MCV 97.5 95.9 99.7 99.1 100.0  PLT 328 346 369 382 638   Basic Metabolic Panel: Recent Labs  Lab 09/02/19 0301 09/03/19 0600 09/03/19 1700 09/04/19 0530 09/05/19 0546 09/06/19 0540  NA 140 138  --  139 140 142  K  3.7 3.5  --  3.4* 3.7 3.3*  CL 95* 96*  --  97* 99 101  CO2 33* 32  --  30 31 32  GLUCOSE 111* 128*  --  150* 162* 163*  BUN 27* 37*  --  40* 58* 59*  CREATININE 0.77 0.86  --  0.80 0.82 0.78  CALCIUM 10.2 10.0  --  10.2 9.7 9.8  MG  --  2.0 2.0 2.0 2.0 2.1  PHOS  --  2.9 3.3 3.0 2.7 1.8*   GFR: Estimated Creatinine Clearance: 114.4 mL/min (by C-G formula based on SCr of 0.78 mg/dL). Liver Function Tests: Recent Labs  Lab 09/02/19 0301 09/03/19 0600 09/04/19 0530 09/05/19 0546 09/06/19 0540  AST _0 ALT _1 ALKPHOS 43 43 45 37* 38  BILITOT 0.3 0.9 0.3 0.7 0.8  PROT 6.9 6.4* 6.4* 6.2* 6.2*  ALBUMIN 3.2* 3.3* 3.0* 3.0* 3.0*   No results for input(s): LIPASE, AMYLASE in the last 168 hours. No results for input(s): AMMONIA in the last 168 hours. Coagulation Profile: Recent Labs  Lab 09/03/19 0600 09/04/19 0530 09/05/19 0546 09/06/19 0540  INR 1.1 1.1 1.1  1.1   Cardiac Enzymes: No results for input(s): CKTOTAL, CKMB, CKMBINDEX, TROPONINI in the last 168 hours. BNP (last 3 results) No results for input(s): PROBNP in the last 8760 hours. HbA1C: No results for input(s): HGBA1C in the last 72 hours. CBG: Recent Labs  Lab 09/06/19 0815 09/06/19 1312 09/06/19 1602 09/07/19 0527 09/07/19 0758  GLUCAP 158* 145* 157* 182* 157*   Lipid Profile: No results for input(s): CHOL, HDL, LDLCALC, TRIG, CHOLHDL, LDLDIRECT in the last 72 hours. Thyroid Function Tests: No results for input(s): TSH, T4TOTAL, FREET4, T3FREE, THYROIDAB in the last 72 hours. Anemia Panel: Recent Labs    09/05/19 0546 09/06/19 0540  FERRITIN 88 84   Urine analysis:    Component Value Date/Time   COLORURINE RED (A) 09/02/2019 0301   APPEARANCEUR TURBID (A) 09/02/2019 0301   LABSPEC  09/02/2019 0301    TEST NOT REPORTED DUE TO COLOR INTERFERENCE OF URINE PIGMENT   PHURINE  09/02/2019 0301    TEST NOT REPORTED DUE TO COLOR INTERFERENCE OF URINE PIGMENT   GLUCOSEU NEGATIVE 09/02/2019 0301   HGBUR NEGATIVE 09/02/2019 0301   BILIRUBINUR NEGATIVE 09/02/2019 0301   KETONESUR NEGATIVE 09/02/2019 0301   PROTEINUR NEGATIVE 09/02/2019 0301   UROBILINOGEN 0.2 08/11/2010 1041   NITRITE NEGATIVE 09/02/2019 0301   LEUKOCYTESUR NEGATIVE 09/02/2019 0301   Sepsis Labs: _2 (procalcitonin:4,lacticidven:4)  ) Recent Results (from the past 240 hour(s))  MRSA PCR Screening     Status: Abnormal   Collection Time: 09/03/19  4:00 AM   Specimen: Nasal Mucosa; Nasopharyngeal  Result Value Ref Range Status   MRSA by PCR POSITIVE (A) NEGATIVE Final    Comment:        The GeneXpert MRSA Assay (FDA approved for NASAL specimens only), is one component of a comprehensive MRSA colonization surveillance program. It is not intended to diagnose MRSA infection nor to guide or monitor treatment for MRSA infections. RESULT CALLED TO, READ BACK BY AND VERIFIED WITH: JOBE,H RN  _3  ON 09/03/2019 JACKSON,K Performed at [facility], [place] [address], [place], [place] [address]   Culture, respiratory (non-expectorated)     Status: None (Preliminary result)   Collection Time: 09/03/19  9:18 AM   Specimen: Tracheal Aspirate; Respiratory  Result Value Ref Range Status   Specimen Description  Final    TRACHEAL ASPIRATE Performed at [place] [address], [place], [place] [address]    Special Requests   Final    NONE Performed at [facility], [place] [address], [place], [place] [address]    Gram Stain NO WBC SEEN RARE GRAM POSITIVE COCCI   Final   Culture   Final    CULTURE REINCUBATED FOR BETTER GROWTH Performed at [facility], [place] [address], [place], [place] [address]    Report Status PENDING  Incomplete  Novel Coronavirus, NAA (Hosp order, Send-out to Ref Lab; TAT 18-24 hrs     Status: None   Collection Time: 09/05/19  1:35 PM   Specimen: Nasopharyngeal Swab; Respiratory  Result Value Ref Range Status   SARS-CoV-2, NAA NOT DETECTED NOT DETECTED Final    Comment: (NOTE) This nucleic acid amplification test was developed and its performance characteristics determined by [employer]. Nucleic acid amplification tests include PCR and TMA. This test has not been FDA cleared or approved. This test has been authorized by FDA under an Emergency Use Authorization (EUA). This test is only authorized for the duration of time the declaration that circumstances exist justifying the authorization of the emergency use of in vitro diagnostic tests for detection of SARS-CoV-2 virus and/or diagnosis of COVID-19 infection under section 564(b)(1) of the Act, 21 U.S.C. 003KJZ-7(H) (1), unless the authorization is terminated or revoked sooner. When diagnostic testing is negative, the possibility of a false negative result should be considered in the context of a patient's recent exposures and the  presence of clinical signs and symptoms consistent with COVID-19. An individual without symptoms of COVID- 19 and who is not shedding SARS-CoV-2 vi rus would expect to have a negative (not detected) result in this assay. Performed At: [facility] [place], [place] [id number] Rush Farmer MD IA:[id number]    [place]  Final    Comment: Performed at [place] [address], [place], [place] [address]         Radiology Studies: No results found.      Scheduled Meds: . sodium chloride   Intravenous Once  . aspirin  81 mg Per Tube Daily  . atorvastatin  20 mg Per Tube Daily  . carvedilol  12.5 mg Per Tube BID WC  . chlorhexidine gluconate (MEDLINE KIT)  15 mL Mouth Rinse BID  . Chlorhexidine Gluconate Cloth  6 each Topical Daily  . clonazePAM  1 mg Per Tube Q8H  . docusate  100 mg Per Tube Daily  . enoxaparin (LOVENOX) injection  40 mg Subcutaneous Q24H  . escitalopram  10 mg Per Tube Daily  . famotidine  20 mg Oral QHS  . feeding supplement (PRO-STAT SUGAR FREE 64)  60 mL Per Tube BID  . ipratropium-albuterol  3 mL Nebulization BID  . methylPREDNISolone (SOLU-MEDROL) injection  55 mg Intravenous Q12H  . morphine  15 mg Per Tube Q8H  . mupirocin ointment   Nasal BID  . polyethylene glycol  17 g Per Tube Daily  . sodium chloride flush  3 mL Intravenous Q12H  . sodium chloride flush  3 mL Intravenous Q12H   Continuous Infusions: . sodium chloride Stopped (09/03/19 2130)  . feeding supplement (VITAL 1.5 CAL) 1,000 mL (09/06/19 1704)     LOS: 5 days   The patient is critically ill with multiple organ systems failure and requires high complexity decision making for assessment and support,  frequent evaluation and titration of therapies, application of advanced monitoring technologies and extensive interpretation of multiple databases. Critical Care Time devoted to patient care services described in this  note  Time spent: 40 minutes     Jarett Dralle, Geraldo Docker, MD [facility] Pager [phone]  If 7PM-7AM, please contact night-coverage [email or web address] Password [facility] 09/07/2019, 8:34 AM

## 2019-09-08 DIAGNOSIS — J9622 Acute and chronic respiratory failure with hypercapnia: Secondary | ICD-10-CM | POA: Diagnosis not present

## 2019-09-08 DIAGNOSIS — J9601 Acute respiratory failure with hypoxia: Secondary | ICD-10-CM | POA: Diagnosis not present

## 2019-09-08 DIAGNOSIS — U071 COVID-19: Secondary | ICD-10-CM | POA: Diagnosis not present

## 2019-09-08 DIAGNOSIS — J9621 Acute and chronic respiratory failure with hypoxia: Secondary | ICD-10-CM | POA: Diagnosis not present

## 2019-09-08 DIAGNOSIS — I480 Paroxysmal atrial fibrillation: Secondary | ICD-10-CM | POA: Diagnosis not present

## 2019-09-08 LAB — D-DIMER, QUANTITATIVE: D-Dimer, Quant: 0.92 ug/mL-FEU — ABNORMAL HIGH (ref 0.00–0.50)

## 2019-09-08 LAB — GLUCOSE, CAPILLARY
Glucose-Capillary: 122 mg/dL — ABNORMAL HIGH (ref 70–99)
Glucose-Capillary: 158 mg/dL — ABNORMAL HIGH (ref 70–99)
Glucose-Capillary: 161 mg/dL — ABNORMAL HIGH (ref 70–99)
Glucose-Capillary: 163 mg/dL — ABNORMAL HIGH (ref 70–99)
Glucose-Capillary: 177 mg/dL — ABNORMAL HIGH (ref 70–99)
Glucose-Capillary: 185 mg/dL — ABNORMAL HIGH (ref 70–99)
Glucose-Capillary: 196 mg/dL — ABNORMAL HIGH (ref 70–99)

## 2019-09-08 LAB — FIBRINOGEN: Fibrinogen: 433 mg/dL (ref 210–475)

## 2019-09-08 LAB — CBC WITH DIFFERENTIAL/PLATELET
Abs Immature Granulocytes: 0.33 10*3/uL — ABNORMAL HIGH (ref 0.00–0.07)
Basophils Absolute: 0.1 10*3/uL (ref 0.0–0.1)
Basophils Relative: 0 %
Eosinophils Absolute: 0 10*3/uL (ref 0.0–0.5)
Eosinophils Relative: 0 %
HCT: 34.9 % — ABNORMAL LOW (ref 39.0–52.0)
Hemoglobin: 11.2 g/dL — ABNORMAL LOW (ref 13.0–17.0)
Immature Granulocytes: 2 %
Lymphocytes Relative: 8 %
Lymphs Abs: 1.7 10*3/uL (ref 0.7–4.0)
MCH: 31.5 pg (ref 26.0–34.0)
MCHC: 32.1 g/dL (ref 30.0–36.0)
MCV: 98 fL (ref 80.0–100.0)
Monocytes Absolute: 1.7 10*3/uL — ABNORMAL HIGH (ref 0.1–1.0)
Monocytes Relative: 9 %
Neutro Abs: 16 10*3/uL — ABNORMAL HIGH (ref 1.7–7.7)
Neutrophils Relative %: 81 %
Platelets: 402 10*3/uL — ABNORMAL HIGH (ref 150–400)
RBC: 3.56 MIL/uL — ABNORMAL LOW (ref 4.22–5.81)
RDW: 13.8 % (ref 11.5–15.5)
WBC: 19.7 10*3/uL — ABNORMAL HIGH (ref 4.0–10.5)
nRBC: 0.2 % (ref 0.0–0.2)

## 2019-09-08 LAB — COMPREHENSIVE METABOLIC PANEL
ALT: 29 U/L (ref 0–44)
AST: 27 U/L (ref 15–41)
Albumin: 2.8 g/dL — ABNORMAL LOW (ref 3.5–5.0)
Alkaline Phosphatase: 34 U/L — ABNORMAL LOW (ref 38–126)
Anion gap: 9 (ref 5–15)
BUN: 51 mg/dL — ABNORMAL HIGH (ref 8–23)
CO2: 31 mmol/L (ref 22–32)
Calcium: 9.4 mg/dL (ref 8.9–10.3)
Chloride: 97 mmol/L — ABNORMAL LOW (ref 98–111)
Creatinine, Ser: 0.76 mg/dL (ref 0.61–1.24)
GFR calc Af Amer: >60 mL/min (ref >60)
GFR calc non Af Amer: >60 mL/min (ref >60)
Glucose, Bld: 190 mg/dL — ABNORMAL HIGH (ref 70–99)
Potassium: 3.9 mmol/L (ref 3.5–5.1)
Sodium: 137 mmol/L (ref 135–145)
Total Bilirubin: 0.5 mg/dL (ref 0.3–1.2)
Total Protein: 5.9 g/dL — ABNORMAL LOW (ref 6.5–8.1)

## 2019-09-08 LAB — PHOSPHORUS: Phosphorus: 2.9 mg/dL (ref 2.5–4.6)

## 2019-09-08 LAB — C-REACTIVE PROTEIN: CRP: <0.8 mg/dL (ref <1.0)

## 2019-09-08 LAB — PROTIME-INR
INR: 1.1 (ref 0.8–1.2)
Prothrombin Time: 14.2 seconds (ref 11.4–15.2)

## 2019-09-08 LAB — MAGNESIUM: Magnesium: 2.2 mg/dL (ref 1.7–2.4)

## 2019-09-08 MED ORDER — INSULIN ASPART 100 UNIT/ML ~~LOC~~ SOLN
0.0000 [IU] | Freq: Three times a day (TID) | SUBCUTANEOUS | Status: DC
  Administered 2019-09-08 – 2019-09-09 (×4): 2 [IU] via SUBCUTANEOUS
  Administered 2019-09-10: 09:00:00 1 [IU] via SUBCUTANEOUS
  Administered 2019-09-10 (×2): 2 [IU] via SUBCUTANEOUS
  Administered 2019-09-11: 17:00:00 1 [IU] via SUBCUTANEOUS
  Administered 2019-09-11: 13:00:00 2 [IU] via SUBCUTANEOUS
  Administered 2019-09-12: 08:00:00 1 [IU] via SUBCUTANEOUS
  Administered 2019-09-12 (×2): 2 [IU] via SUBCUTANEOUS
  Administered 2019-09-14: 12:00:00 1 [IU] via SUBCUTANEOUS

## 2019-09-08 MED ORDER — CHLORHEXIDINE GLUCONATE 0.12 % MT SOLN
15.0000 mL | Freq: Two times a day (BID) | OROMUCOSAL | Status: DC
  Administered 2019-09-08 – 2019-09-15 (×14): 15 mL via OROMUCOSAL
  Filled 2019-09-08 (×10): qty 15

## 2019-09-08 MED ORDER — FUROSEMIDE 10 MG/ML IJ SOLN
40.0000 mg | Freq: Four times a day (QID) | INTRAMUSCULAR | Status: AC
  Administered 2019-09-08 (×2): 40 mg via INTRAVENOUS
  Filled 2019-09-08 (×2): qty 4

## 2019-09-08 MED ORDER — INSULIN ASPART 100 UNIT/ML ~~LOC~~ SOLN: 0.0000 [IU] | Freq: Every day | SUBCUTANEOUS | Status: DC

## 2019-09-08 NOTE — Progress Notes (Signed)
Facetime call with brother, Juanda Crumble, at [phone]. All questions answered at present time. Will continue with POC.

## 2019-09-08 NOTE — Plan of Care (Signed)

## 2019-09-08 NOTE — Progress Notes (Signed)
NAME:  Sergio Mitchell, MRN:  [id number], DOB:  16-May-1951, LOS: 69 ADMISSION DATE:  09/02/2019, CONSULTATION DATE:  10/10 REFERRING MD:  Lorin Mercy, CHIEF COMPLAINT:  Hypoxemia   Brief History   68 y/o male admitted from [facility] to [facility] on 10/10 for hypoxemia due to COVID 19 pneumonia.  At baseline the patient has ventilator dependent respiratory failure and systolic heart failure.   Past Medical History  Chronic respiratory failure with hypoxemia, ventilator dependent Tracheostomy status Chronic systolic heart faiulre Fibromyalgia Chronic pain Osteoporosis Scoliosis Spinal stenosis  Significant Hospital Events   10/10 admission 10/11 worsening confusion, hypoxemia  Consults:  PCCM  Procedures:  Tracheostomy, chronic (May 2020?)  Significant Diagnostic Tests:    Micro Data:  10/7 SARS COV 2 Positive [facility]) 10/11 resp culture > rare GPC  Antimicrobials/COVID Treatment  10/10 Convalescent plasma > 10/10 decadron >  10/10 remdesivir >  10/14  Interim history/subjective:   Increased work of breathing this morning, on higher FiO2 and PEEP  Objective   Blood pressure (!) 134/58, pulse 72, temperature 98.1 F (36.7 C), temperature source Oral, resp. rate 18, height 6\' 2"  (1.88 m), weight 106.4 kg, SpO2 90 %.    Vent Mode: PRVC FiO2 (%):  [60 %-100 %] 80 % Set Rate:  [24 bmp] 24 bmp Vt Set:  [500 mL] 500 mL PEEP:  [5 cmH20-8 cmH20] 8 cmH20 Plateau Pressure:  [14 cmH20-23 cmH20] 14 cmH20   Intake/Output Summary (Last 24 hours) at 09/08/2019 1504 Last data filed at 09/08/2019 1200 Gross per 24 hour  Intake 2026.17 ml  Output 4610 ml  Net -2583.83 ml   Filed Weights   09/03/19 0500 09/05/19 0500 09/08/19 0500  Weight: 109.9 kg 108.6 kg 106.4 kg    Examination:  General:  In bed on vent HENT: NCAT tracheostomy in place PULM: CTA B, vent supported breathing CV: RRR, no mgr GI: BS+, soft, nontender MSK: normal bulk and tone Neuro: awake on vent,  following commands   10/14 CXR > pulmonary parenchymal largely clear, tracheostomy in place  Resolved Hospital Problem list     Assessment & Plan:  Acute on chronic respiratory failure with hypoxemia due to COVID 19 pneumonia, generalized weakness, chronic systolic heart failure (not clearly ARDS): improving 10/14, some pulmonary edema 10/15? Repeat CXR Furosemide again today Decadron 10 day course remdesivir completed Tracheostomy care per routine Pressure support vent as able today VAP prevention  Acute on Chronic systolic heart failure Tele Continue coreg Monitor I/O and hemodynamics Lasix today  Physical deconditioning PT/OT consult as able   Best practice:  Diet: tube feeding, allow to take food by mouth as well Pain/Anxiety/Delirium protocol (if indicated): no VAP protocol (if indicated): yes DVT prophylaxis: Lovenox low dose per COVID PACT Trial GI prophylaxis: lansoprazole for stress ulcer prophylaxis Glucose control: SSI Mobility: bed rest Code Status: DNR Family Communication: per [facility] Disposition: remain in ICU  Labs   CBC: Recent Labs  Lab 09/04/19 0530 09/05/19 0546 09/06/19 0540 09/07/19 0555 09/08/19 0600  WBC 24.0* 19.0* 19.0* 20.7* 19.7*  NEUTROABS 21.6* 17.2* 15.5* 16.4* 16.0*  HGB 10.7* 10.7* 10.5* 11.0* 11.2*  HCT 32.4* 33.6* 33.2* 35.0* 34.9*  MCV 95.9 99.7 99.1 100.0 98.0  PLT 346 369 382 392 402*    Basic Metabolic Panel: Recent Labs  Lab 09/04/19 0530 09/05/19 0546 09/06/19 0540 09/07/19 0555 09/08/19 0600  NA 139 140 142 141 137  K 3.4* 3.7 3.3* 3.5 3.9  CL 97* 99 101 102 97*  CO2 30 31 32 30 31  GLUCOSE 150* 162* 163* 205* 190*  BUN 40* 58* 59* 54* 51*  CREATININE 0.80 0.82 0.78 0.75 0.76  CALCIUM 10.2 9.7 9.8 9.6 9.4  MG 2.0 2.0 2.1 2.2 2.2  PHOS 3.0 2.7 1.8* 1.9* 2.9   GFR: Estimated Creatinine Clearance: 113.3 mL/min (by C-G formula based on SCr of 0.76 mg/dL). Recent Labs  Lab 09/02/19 0301 09/02/19 0324   09/05/19 0546 09/06/19 0540 09/07/19 0555 09/08/19 0600  PROCALCITON  --  <0.10  --   --   --   --   --   WBC 16.0*  --    < > 19.0* 19.0* 20.7* 19.7*  LATICACIDVEN 1.0  --   --   --   --   --   --    < > = values in this interval not displayed.    Liver Function Tests: Recent Labs  Lab 09/04/19 0530 09/05/19 0546 09/06/19 0540 09/07/19 0555 09/08/19 0600  AST 18 18 17 17 27   ALT 22 21 21 22 29   ALKPHOS 45 37* 38 38 34*  BILITOT 0.3 0.7 0.8 0.5 0.5  PROT 6.4* 6.2* 6.2* 6.2* 5.9*  ALBUMIN 3.0* 3.0* 3.0* 2.9* 2.8*   No results for input(s): LIPASE, AMYLASE in the last 168 hours. No results for input(s): AMMONIA in the last 168 hours.  ABG    Component Value Date/Time   PHART 7.485 (H) 09/02/2019 0234   PCO2ART 51.2 (H) 09/02/2019 0234   PO2ART 152.0 (H) 09/02/2019 0234   HCO3 38.3 (H) 09/02/2019 0234   TCO2 40 (H) 09/02/2019 0234   O2SAT 99.0 09/02/2019 0234     Coagulation Profile: Recent Labs  Lab 09/04/19 0530 09/05/19 0546 09/06/19 0540 09/07/19 0555 09/08/19 0600  INR 1.1 1.1 1.1 1.1 1.1    Cardiac Enzymes: No results for input(s): CKTOTAL, CKMB, CKMBINDEX, TROPONINI in the last 168 hours.  HbA1C: No results found for: HGBA1C  CBG: Recent Labs  Lab 09/07/19 2016 09/08/19 0040 09/08/19 0548 09/08/19 0752 09/08/19 1224  GLUCAP 166* 177* 163* 161* 196*      Critical care time: 31 minutes     09/10/19, MD [place] [facility] Pager: [phone] Cell: [phone] If no response, call [phone]

## 2019-09-08 NOTE — Progress Notes (Addendum)
PROGRESS NOTE    Sergio Mitchell  DJT:[id number] DOB: 1951-08-23 DOA: 09/02/2019 PCP: [name], MD   Brief Narrative:  68 yo Male PMHx TRACH-dependent respiratory failure with PEG tube; chronic systolic CHF; and afib presenting with COVID-19 infection.   The patient is a tragic one who appeared to be in reasonably good health other than fibromyalgia and chronic pain until he was admitted at [facility] on 5/25. He presented with CP and SOB and eventually was diagnosed with cardiogenic shock with EF 20-25%, recommended for AICD. However, he also developed septic shock from HCAP with BAL positive for H flu. He required trach/peg and was discharged to [facility]. He also had encephalopathy with anxiety and was treated by psychiatry. He was admitted to [facility] on 7/15 and has been tolerating some CPAP and T-bar trials. He was treated recently [facility] for HCAP and was sent to [facility] for evaluation; I consulted on him on 9/7 and he was stable for d/c back to [facility] at that time.  Returned to ED with COVID-19 positive labs and worsening hypoxia.  I spoke with his sister and brother-in-law. They had many excellent questions about treatments and prognosis and understand the severity of his condition.  His sister reports that he would prefer to be DNR at this time. [name] who is trach and Vent dependent from [facility] diagnosed with Covid 19 and sent to the er as they don't keep Covid pt. He seems to be at his baseline.   Subjective: 10/16 nods yes and no to questions appropriately.  Asks can he go outside and drive a car around the hospital and then come back.    Assessment & Plan:   Principal Problem:   Acute on chronic respiratory failure with hypoxia and hypercapnia (HCC) Active Problems:   Atrial fibrillation (HCC)   Acute respiratory disease due to COVID-19 virus   Respiratory tract infection due to COVID-19 virus   Chronic systolic CHF (congestive heart failure) (HCC)  Pressure injury of skin   AF (paroxysmal atrial fibrillation) (HCC)  Acute on chronic respiratory failure with hypoxia and hypercapnia/Covid pneumonia - VAP 1 month prior to admission treated with cefepime and vancomycin.  Resolved patient was able to come off ventilator. - Worsening hypoxia starting 3 weeks prior to hospitalization.  Placed back on ventilator~2 weeks prior to hospitalization. - CTA; negative PE, LEFT sided atelectasis and inferior lung collapse (chronic) - Complete Remdesivir per pharmacy; stop date 10/14 - Solu-Medrol 55 mg BID stop date 10/19 - 10/11 COVID convalescent plasma x1 dose -10/15 patient with frothy clear discharge from trach receiving Lasix today.  If continued in a.m. consider adding Robinul -10/15 tolerated trach collar for~6 hours -10/16 most likely tired patient now yesterday now back on ventilator will attempt to wean.  COVID-19 Labs  Recent Labs    09/06/19 0540 09/07/19 0555 09/08/19 0600  DDIMER 1.17* 1.12* 0.92*  FERRITIN 84 80  --   CRP <0.8 <0.8  --    Lab Results  Component Value Date   SARSCOV2NAA NOT DETECTED 09/05/2019   [place] NEGATIVE 30/07/2329   Chronic systolic CHF - Strict in and out -1.1 L -Daily weight Filed Weights   09/03/19 0500 09/05/19 0500 09/08/19 0500  Weight: 109.9 kg 108.6 kg 106.4 kg  -Coreg 12.5 mg BID -10/16 increase Lasix 40 mg QID  A. fib rate controlled -10/14 currently NSR -See CHF  Acute metabolic encephalopathy -Resolved  Depression/anxiety - Klonopin, 1 mg TID -Lexapro, 10 mg daily -Seroquel  50 mg BID PRN  Hypokalemia -Potassium goal> 4  Hypophosphatemia -See hypokalemia   Goals of care - 10/14 social work consult; patient has expressed he does not want to return to [facility]   DVT prophylaxis: Lovenox Code Status: DNR Family Communication: 10/16 spoke with sister and brother-in-law explained plan of care answered all questions   Disposition Plan: TBD   Consultants:   PCCM    Procedures/Significant Events:     I have personally reviewed and interpreted all radiology studies and my findings are as above.  VENTILATOR SETTINGS: Vent mode; PRVC Set rate; 24 Vt set; 500 FiO2; 80% PEEP; 8   Cultures 10/11 MRSA by PCR positive 10/11 tracheal aspirate for flora 10/13 Novel coronavirus negative    Antimicrobials: Anti-infectives (From admission, onward)   Start     Stop   09/03/19 1000  remdesivir 100 mg in sodium chloride 0.9 % 250 mL IVPB     09/06/19 1044   09/02/19 1000  remdesivir 200 mg in sodium chloride 0.9 % 250 mL IVPB     09/02/19 1115       Devices    LINES / TUBES:      Continuous Infusions: . sodium chloride 10 mL/hr at 09/08/19 0700  . feeding supplement (VITAL 1.5 CAL) 1,000 mL (09/07/19 2001)     Objective: Vitals:   09/08/19 0600 09/08/19 0700 09/08/19 0758 09/08/19 0800  BP: 132/80 120/83    Pulse: 65 (!) 54  (!) 56  Resp: 19 (!) 24  (!) 24  Temp:    98.4 F (36.9 C)  TempSrc:    Oral  SpO2: 99% 98% 95% 93%  Weight:      Height:        Intake/Output Summary (Last 24 hours) at 09/08/2019 0801 Last data filed at 09/08/2019 0700 Gross per 24 hour  Intake 2536.17 ml  Output 4560 ml  Net -2023.83 ml   Filed Weights   09/03/19 0500 09/05/19 0500 09/08/19 0500  Weight: 109.9 kg 108.6 kg 106.4 kg   Physical Exam:  General: A/O x4, positive acute respiratory distress Eyes: negative scleral hemorrhage, negative anisocoria, negative icterus ENT: Negative Runny nose, negative gingival bleeding, #7 trach in place negative signs of infection.  Clear frothy discharge resolved Neck:  Negative scars, masses, torticollis, lymphadenopathy, JVD Lungs: Clear to auscultation bilaterally without wheezes or crackles Cardiovascular: Regular rate and rhythm without murmur gallop or rub normal S1 and S2 Abdomen: negative abdominal pain, nondistended, positive soft, bowel sounds, no rebound, no ascites, no  appreciable mass Extremities: No significant cyanosis, clubbing, or edema bilateral lower extremities Skin: Negative rashes, lesions, ulcers Psychiatric:  Negative depression, negative anxiety, negative fatigue, negative mania  Central nervous system:  Cranial nerves II through XII intact, tongue/uvula midline, all extremities muscle strength 5/5, sensation intact throughout, negative expressive aphasia, negative receptive aphasia.       Data Reviewed: Care during the described time interval was provided by me .  I have reviewed this patient's available data, including medical history, events of note, physical examination, and all test results as part of my evaluation.   CBC: Recent Labs  Lab 09/04/19 0530 09/05/19 0546 09/06/19 0540 09/07/19 0555 09/08/19 0600  WBC 24.0* 19.0* 19.0* 20.7* 19.7*  NEUTROABS 21.6* 17.2* 15.5* 16.4* 16.0*  HGB 10.7* 10.7* 10.5* 11.0* 11.2*  HCT 32.4* 33.6* 33.2* 35.0* 34.9*  MCV 95.9 99.7 99.1 100.0 98.0  PLT 346 369 382 392 793*   Basic Metabolic Panel: Recent Labs  Lab  09/03/19 0600 09/03/19 1700 09/04/19 0530 09/05/19 0546 09/06/19 0540 09/07/19 0555  NA 138  --  139 140 142 141  K 3.5  --  3.4* 3.7 3.3* 3.5  CL 96*  --  97* 99 101 102  CO2 32  --  30 31 32 30  GLUCOSE 128*  --  150* 162* 163* 205*  BUN 37*  --  40* 58* 59* 54*  CREATININE 0.86  --  0.80 0.82 0.78 0.75  CALCIUM 10.0  --  10.2 9.7 9.8 9.6  MG 2.0 2.0 2.0 2.0 2.1 2.2  PHOS 2.9 3.3 3.0 2.7 1.8* 1.9*   GFR: Estimated Creatinine Clearance: 113.3 mL/min (by C-G formula based on SCr of 0.75 mg/dL). Liver Function Tests: Recent Labs  Lab 09/03/19 0600 09/04/19 0530 09/05/19 0546 09/06/19 0540 09/07/19 0555  AST _0 ALT _1 ALKPHOS 43 45 37* 38 38  BILITOT 0.9 0.3 0.7 0.8 0.5  PROT 6.4* 6.4* 6.2* 6.2* 6.2*  ALBUMIN 3.3* 3.0* 3.0* 3.0* 2.9*   No results for input(s): LIPASE, AMYLASE in the last 168 hours. No results for input(s): AMMONIA  in the last 168 hours. Coagulation Profile: Recent Labs  Lab 09/04/19 0530 09/05/19 0546 09/06/19 0540 09/07/19 0555 09/08/19 0600  INR 1.1 1.1 1.1 1.1 1.1   Cardiac Enzymes: No results for input(s): CKTOTAL, CKMB, CKMBINDEX, TROPONINI in the last 168 hours. BNP (last 3 results) No results for input(s): PROBNP in the last 8760 hours. HbA1C: No results for input(s): HGBA1C in the last 72 hours. CBG: Recent Labs  Lab 09/07/19 0758 09/07/19 1218 09/07/19 1826 09/07/19 2016 09/08/19 0548  GLUCAP 157* 136* 162* 166* 163*   Lipid Profile: No results for input(s): CHOL, HDL, LDLCALC, TRIG, CHOLHDL, LDLDIRECT in the last 72 hours. Thyroid Function Tests: No results for input(s): TSH, T4TOTAL, FREET4, T3FREE, THYROIDAB in the last 72 hours. Anemia Panel: Recent Labs    09/06/19 0540 09/07/19 0555  FERRITIN 84 80   Urine analysis:    Component Value Date/Time   COLORURINE RED (A) 09/02/2019 0301   APPEARANCEUR TURBID (A) 09/02/2019 0301   LABSPEC  09/02/2019 0301    TEST NOT REPORTED DUE TO COLOR INTERFERENCE OF URINE PIGMENT   PHURINE  09/02/2019 0301    TEST NOT REPORTED DUE TO COLOR INTERFERENCE OF URINE PIGMENT   GLUCOSEU NEGATIVE 09/02/2019 0301   HGBUR NEGATIVE 09/02/2019 0301   BILIRUBINUR NEGATIVE 09/02/2019 0301   KETONESUR NEGATIVE 09/02/2019 0301   PROTEINUR NEGATIVE 09/02/2019 0301   UROBILINOGEN 0.2 08/11/2010 1041   NITRITE NEGATIVE 09/02/2019 0301   LEUKOCYTESUR NEGATIVE 09/02/2019 0301   Sepsis Labs: _2 (procalcitonin:4,lacticidven:4)  ) Recent Results (from the past 240 hour(s))  MRSA PCR Screening     Status: Abnormal   Collection Time: 09/03/19  4:00 AM   Specimen: Nasal Mucosa; Nasopharyngeal  Result Value Ref Range Status   MRSA by PCR POSITIVE (A) NEGATIVE Final    Comment:        The GeneXpert MRSA Assay (FDA approved for NASAL specimens only), is one component of a comprehensive MRSA colonization surveillance program. It is  not intended to diagnose MRSA infection nor to guide or monitor treatment for MRSA infections. RESULT CALLED TO, READ BACK BY AND VERIFIED WITH: JOBE,H RN _3  ON 09/03/2019 JACKSON,K Performed at [facility], [place] [address], [place], [place] [address]   Culture, respiratory (non-expectorated)     Status: None   Collection  Time: 09/03/19  9:18 AM   Specimen: Tracheal Aspirate; Respiratory  Result Value Ref Range Status   Specimen Description   Final    TRACHEAL ASPIRATE Performed at [place] [address], [place], [place] [address]    Special Requests   Final    NONE Performed at [facility], [place] [address] [place], [place] [address]    Gram Stain NO WBC SEEN RARE GRAM POSITIVE COCCI   Final   Culture   Final    Consistent with normal respiratory flora. Performed at [facility], [place] [address] [place], [place] [address]    Report Status 09/07/2019 FINAL  Final  Novel Coronavirus, NAA (Hosp order, Send-out to Ref Lab; TAT 18-24 hrs     Status: None   Collection Time: 09/05/19  1:35 PM   Specimen: Nasopharyngeal Swab; Respiratory  Result Value Ref Range Status   SARS-CoV-2, NAA NOT DETECTED NOT DETECTED Final    Comment: (NOTE) This nucleic acid amplification test was developed and its performance characteristics determined by [employer]. Nucleic acid amplification tests include PCR and TMA. This test has not been FDA cleared or approved. This test has been authorized by FDA under an Emergency Use Authorization (EUA). This test is only authorized for the duration of time the declaration that circumstances exist justifying the authorization of the emergency use of in vitro diagnostic tests for detection of SARS-CoV-2 virus and/or diagnosis of COVID-19 infection under section 564(b)(1) of the Act, 21 U.S.C. 338SNK-5(L) (1), unless the authorization is terminated or revoked sooner. When  diagnostic testing is negative, the possibility of a false negative result should be considered in the context of a patient's recent exposures and the presence of clinical signs and symptoms consistent with COVID-19. An individual without symptoms of COVID- 19 and who is not shedding SARS-CoV-2 vi rus would expect to have a negative (not detected) result in this assay. Performed At: [facility] [place], [place] [id number] Rush Farmer MD XT:[id number]    [place]  Final    Comment: Performed at [place] [address], [place], [place] [address]         Radiology Studies: No results found.      Scheduled Meds: . sodium chloride   Intravenous Once  . aspirin  81 mg Per Tube Daily  . atorvastatin  20 mg Per Tube Daily  . carvedilol  12.5 mg Per Tube BID WC  . chlorhexidine  15 mL Mouth/Throat BID  . Chlorhexidine Gluconate Cloth  6 each Topical Daily  . clonazePAM  1 mg Per Tube Q8H  . docusate  100 mg Per Tube Daily  . enoxaparin (LOVENOX) injection  40 mg Subcutaneous Q24H  . escitalopram  10 mg Per Tube Daily  . famotidine  20 mg Oral QHS  . feeding supplement (PRO-STAT SUGAR FREE 64)  60 mL Per Tube BID  . ipratropium-albuterol  3 mL Nebulization BID  . methylPREDNISolone (SOLU-MEDROL) injection  55 mg Intravenous Q12H  . morphine  15 mg Per Tube Q8H  . mupirocin ointment   Nasal BID  . polyethylene glycol  17 g Per Tube Daily  . sodium chloride flush  3 mL Intravenous Q12H  . sodium chloride flush  3 mL Intravenous Q12H   Continuous Infusions: . sodium chloride 10 mL/hr at 09/08/19 0700  . feeding supplement (VITAL 1.5 CAL) 1,000 mL (09/07/19 2001)     LOS: 6 days  The patient is critically ill with multiple organ systems failure and requires high complexity decision making for assessment and support, frequent evaluation and titration of therapies, application of advanced monitoring  technologies and extensive interpretation of multiple databases. Critical Care Time devoted to patient care services described in this note  Time spent: 40 minutes     Honestee Revard, Geraldo Docker, MD [facility] Pager [phone]  If 7PM-7AM, please contact night-coverage [email or web address] Password [email or web address] 09/08/2019, 8:01 AM

## 2019-09-08 NOTE — Progress Notes (Signed)
ANTICOAGULATION CONSULT NOTE - Follow Up Consult  Pharmacy Consult for Lovenox Indication: VTE prophylaxis (COVID-PACT trial)  Allergies  Allergen Reactions  . Bacitracin Other (See Comments)    Per MAR  . Cefdinir Other (See Comments)    Per MAR  . Lidocaine Other (See Comments)    Per MAR  . Neomycin Other (See Comments)    Per MAR  . Neosporin Af [Miconazole] Other (See Comments)    Unknown 9.7.2020 Per [facility] MAR pt is currently using Miconazole Topical [place]  . Pantoprazole Other (See Comments)    unknown  . Penicillins Other (See Comments)    Per MAR  . Polymyxin B Other (See Comments)    Per MAR  . Pramoxine Other (See Comments)    Per [facility]    Patient Measurements: Height: 6\' 2"  (188 cm) Weight: 234 lb 9.1 oz (106.4 kg) IBW/kg (Calculated) : 82.2  Vital Signs: Temp: 98.1 F (36.7 C) (10/16 1200) Temp Source: Oral (10/16 1200) BP: 134/58 (10/16 1200) Pulse Rate: 72 (10/16 1200)  Labs: Recent Labs    09/06/19 0540 09/07/19 0555 09/08/19 0600  HGB 10.5* 11.0* 11.2*  HCT 33.2* 35.0* 34.9*  PLT 382 392 402*  LABPROT 14.5 14.0 14.2  INR 1.1 1.1 1.1  CREATININE 0.78 0.75 0.76    Estimated Creatinine Clearance: 113.3 mL/min (by C-G formula based on SCr of 0.76 mg/dL).   Medical History: Past Medical History:  Diagnosis Date  . DDD (degenerative disc disease), lumbar   . Fibromyositis   . Osteoporosis   . S/P percutaneous endoscopic gastrostomy (PEG) tube placement (HCC)   . Scoliosis   . Spinal stenosis   . Systolic CHF, chronic (HCC) 04/2019   EF 20-25%, ACID recommended  . Tracheostomy tube present (HCC)   . Ventilator dependence (HCC)     Medications:  Scheduled:  . sodium chloride   Intravenous Once  . aspirin  81 mg Per Tube Daily  . atorvastatin  20 mg Per Tube Daily  . carvedilol  12.5 mg Per Tube BID WC  . chlorhexidine  15 mL Mouth/Throat BID  . Chlorhexidine Gluconate Cloth  6 each Topical Daily  . clonazePAM  1 mg Per Tube  Q8H  . docusate  100 mg Per Tube Daily  . enoxaparin (LOVENOX) injection  40 mg Subcutaneous Q24H  . escitalopram  10 mg Per Tube Daily  . famotidine  20 mg Oral QHS  . feeding supplement (PRO-STAT SUGAR FREE 64)  60 mL Per Tube BID  . insulin aspart  0-5 Units Subcutaneous QHS  . insulin aspart  0-9 Units Subcutaneous TID WC  . ipratropium-albuterol  3 mL Nebulization BID  . methylPREDNISolone (SOLU-MEDROL) injection  55 mg Intravenous Q12H  . morphine  15 mg Per Tube Q8H  . mupirocin ointment   Nasal BID  . polyethylene glycol  17 g Per Tube Daily  . sodium chloride flush  3 mL Intravenous Q12H  . sodium chloride flush  3 mL Intravenous Q12H   Infusions:  . sodium chloride 10 mL/hr at 09/08/19 0700  . feeding supplement (VITAL 1.5 CAL) 1,000 mL (09/07/19 2001)   PRN: sodium chloride, acetaminophen, albuterol, bisacodyl, fentaNYL (SUBLIMAZE) injection, midazolam, ondansetron **OR** ondansetron (ZOFRAN) IV, oxyCODONE, QUEtiapine, sodium chloride flush, zolpidem  Assessment: 67 yo male admitted from [facility] to [facility] on 09/02/2019 for hypoxemia due to COVID 19 pneumonia. Patient has been enrolled into the COVID-PACT study and assigned to Lovenox VTE prophylaxis arm. Pharmacy has been consulted to dose  enoxaparin for VTE prophylaxis per the COVID-PACT trial. Patient weighs 106.4kg and BMI is 30. CrCl is 111.51ml/min. d-Dimer 0.92.   Patient has been receiving enoxaparin 40mg  q24h since trial enrollment which is appropriate based on CrCl >30 and BMI <35. Hgb and plt are stable. No reported bleeding.     Goal of Therapy:  Prevention of VTE Monitor platelets by anticoagulation protocol: Yes   Plan:  Continue enoxaparin 40mg  SQ q24h per COVID-PACT protocol Monitor CBC and signs/symptoms of bleeding   Cristela Felt, PharmD PGY1 Pharmacy Resident [employer]: [phone]  09/08/2019,12:25 PM

## 2019-09-08 NOTE — Progress Notes (Signed)
Attempted to restart tube feed but pt is refusing. Education given but pt is adamant not to start it. Ive been told by his day nurse that he did not wanted any of it.

## 2019-09-09 ENCOUNTER — Inpatient Hospital Stay (HOSPITAL_COMMUNITY): Payer: Medicare Other

## 2019-09-09 DIAGNOSIS — J9622 Acute and chronic respiratory failure with hypercapnia: Secondary | ICD-10-CM | POA: Diagnosis not present

## 2019-09-09 DIAGNOSIS — J9621 Acute and chronic respiratory failure with hypoxia: Secondary | ICD-10-CM | POA: Diagnosis not present

## 2019-09-09 DIAGNOSIS — L89151 Pressure ulcer of sacral region, stage 1: Secondary | ICD-10-CM | POA: Diagnosis present

## 2019-09-09 DIAGNOSIS — U071 COVID-19: Secondary | ICD-10-CM | POA: Diagnosis not present

## 2019-09-09 DIAGNOSIS — I480 Paroxysmal atrial fibrillation: Secondary | ICD-10-CM | POA: Diagnosis not present

## 2019-09-09 DIAGNOSIS — J9601 Acute respiratory failure with hypoxia: Secondary | ICD-10-CM | POA: Diagnosis not present

## 2019-09-09 LAB — MAGNESIUM: Magnesium: 2.4 mg/dL (ref 1.7–2.4)

## 2019-09-09 LAB — CBC WITH DIFFERENTIAL/PLATELET
Abs Immature Granulocytes: 0.25 10*3/uL — ABNORMAL HIGH (ref 0.00–0.07)
Basophils Absolute: 0 10*3/uL (ref 0.0–0.1)
Basophils Relative: 0 %
Eosinophils Absolute: 0 10*3/uL (ref 0.0–0.5)
Eosinophils Relative: 0 %
HCT: 36.9 % — ABNORMAL LOW (ref 39.0–52.0)
Hemoglobin: 12 g/dL — ABNORMAL LOW (ref 13.0–17.0)
Immature Granulocytes: 1 %
Lymphocytes Relative: 7 %
Lymphs Abs: 1.4 10*3/uL (ref 0.7–4.0)
MCH: 31 pg (ref 26.0–34.0)
MCHC: 32.5 g/dL (ref 30.0–36.0)
MCV: 95.3 fL (ref 80.0–100.0)
Monocytes Absolute: 1.5 10*3/uL — ABNORMAL HIGH (ref 0.1–1.0)
Monocytes Relative: 8 %
Neutro Abs: 16.3 10*3/uL — ABNORMAL HIGH (ref 1.7–7.7)
Neutrophils Relative %: 84 %
Platelets: 440 10*3/uL — ABNORMAL HIGH (ref 150–400)
RBC: 3.87 MIL/uL — ABNORMAL LOW (ref 4.22–5.81)
RDW: 13.6 % (ref 11.5–15.5)
WBC: 19.5 10*3/uL — ABNORMAL HIGH (ref 4.0–10.5)
nRBC: 0.1 % (ref 0.0–0.2)

## 2019-09-09 LAB — COMPREHENSIVE METABOLIC PANEL
ALT: 31 U/L (ref 0–44)
AST: 26 U/L (ref 15–41)
Albumin: 3 g/dL — ABNORMAL LOW (ref 3.5–5.0)
Alkaline Phosphatase: 33 U/L — ABNORMAL LOW (ref 38–126)
Anion gap: 12 (ref 5–15)
BUN: 55 mg/dL — ABNORMAL HIGH (ref 8–23)
CO2: 32 mmol/L (ref 22–32)
Calcium: 9.9 mg/dL (ref 8.9–10.3)
Chloride: 96 mmol/L — ABNORMAL LOW (ref 98–111)
Creatinine, Ser: 0.84 mg/dL (ref 0.61–1.24)
GFR calc Af Amer: >60 mL/min (ref >60)
GFR calc non Af Amer: >60 mL/min (ref >60)
Glucose, Bld: 148 mg/dL — ABNORMAL HIGH (ref 70–99)
Potassium: 3.7 mmol/L (ref 3.5–5.1)
Sodium: 140 mmol/L (ref 135–145)
Total Bilirubin: 0.8 mg/dL (ref 0.3–1.2)
Total Protein: 6.3 g/dL — ABNORMAL LOW (ref 6.5–8.1)

## 2019-09-09 LAB — GLUCOSE, CAPILLARY
Glucose-Capillary: 143 mg/dL — ABNORMAL HIGH (ref 70–99)
Glucose-Capillary: 146 mg/dL — ABNORMAL HIGH (ref 70–99)
Glucose-Capillary: 197 mg/dL — ABNORMAL HIGH (ref 70–99)
Glucose-Capillary: 189 mg/dL — ABNORMAL HIGH (ref 70–99)
Glucose-Capillary: 117 mg/dL — ABNORMAL HIGH (ref 70–99)

## 2019-09-09 LAB — D-DIMER, QUANTITATIVE: D-Dimer, Quant: 0.94 ug/mL-FEU — ABNORMAL HIGH (ref 0.00–0.50)

## 2019-09-09 LAB — PHOSPHORUS: Phosphorus: 4.3 mg/dL (ref 2.5–4.6)

## 2019-09-09 LAB — PROTIME-INR
INR: 1.1 (ref 0.8–1.2)
Prothrombin Time: 14 seconds (ref 11.4–15.2)

## 2019-09-09 LAB — C-REACTIVE PROTEIN: CRP: <0.8 mg/dL (ref <1.0)

## 2019-09-09 NOTE — Progress Notes (Signed)
Placed pt back on PRVC on previous setting due to Pt desating to 77%.

## 2019-09-09 NOTE — Progress Notes (Addendum)
PROGRESS NOTE    Sergio Mitchell  MMN:[id number] DOB: Jan 30, 1951 DOA: 09/02/2019 PCP: Sergio Marble, MD   Brief Narrative:  68 yo Male PMHx TRACH-dependent respiratory failure with PEG tube; chronic systolic CHF; and afib presenting with COVID-19 infection.   The patient is a tragic one who appeared to be in reasonably good health other than fibromyalgia and chronic pain until he was admitted at [facility] on 5/25. He presented with CP and SOB and eventually was diagnosed with cardiogenic shock with EF 20-25%, recommended for AICD. However, he also developed septic shock from HCAP with BAL positive for H flu. He required trach/peg and was discharged to [facility]. He also had encephalopathy with anxiety and was treated by psychiatry. He was admitted to [facility] on 7/15 and has been tolerating some CPAP and T-bar trials. He was treated recently [facility] for HCAP and was sent to [facility] for evaluation; I consulted on him on 9/7 and he was stable for d/c back to [facility] at that time.  Returned to ED with COVID-19 positive labs and worsening hypoxia.  I spoke with his sister and brother-in-law. They had many excellent questions about treatments and prognosis and understand the severity of his condition.  His sister reports that he would prefer to be DNR at this time. Sergio Mitchell who is trach and Vent dependent from [facility] diagnosed with Covid 19 and sent to the er as they don't keep Covid pt. He seems to be at his baseline.   Subjective: 10/17 A/O x4, speaking over trach.  Requested know when he can have PMV placed so he can talk appropriately.   Assessment & Plan:   Principal Problem:   Acute on chronic respiratory failure with hypoxia and hypercapnia (HCC) Active Problems:   Atrial fibrillation (HCC)   Acute respiratory disease due to COVID-19 virus   Respiratory tract infection due to COVID-19 virus   Chronic systolic CHF (congestive heart failure) (HCC)   Pressure injury of skin   AF (paroxysmal atrial fibrillation) (HCC)  Acute on chronic respiratory failure with hypoxia and hypercapnia/Covid pneumonia - VAP 1 month prior to admission treated with cefepime and vancomycin.  Resolved patient was able to come off ventilator. - Worsening hypoxia starting 3 weeks prior to hospitalization.  Placed back on ventilator~2 weeks prior to hospitalization. - CTA; negative PE, LEFT sided atelectasis and inferior lung collapse (chronic) - Complete Remdesivir per pharmacy; stop date 10/14 - Solu-Medrol 55 mg BID stop date 10/19 - 10/11 COVID convalescent plasma x1 dose -10/15 patient with frothy clear discharge from trach receiving Lasix today.  If continued in a.m. consider adding Robinul -10/15 tolerated trach collar for~6 hours -10/17 wean patient as tolerated hopefully will be able tolerate trach collar.  COVID-19 Labs  Recent Labs    09/07/19 0555 09/08/19 0600 09/09/19 0605  DDIMER 1.12* 0.92* 0.94*  FERRITIN 80  --   --   CRP <0.8 <0.8  --    Lab Results  Component Value Date   SARSCOV2NAA NOT DETECTED 09/05/2019   Emmet NEGATIVE 90/38/3338   Chronic systolic CHF - Strict in and out -3.8 L -Daily weight Filed Weights   09/05/19 0500 09/08/19 0500 09/09/19 0412  Weight: 108.6 kg 106.4 kg 106.2 kg  -Coreg 12.5 mg BID -Lasix PRN  A. fib rate controlled -10/14 currently NSR -See CHF  Acute metabolic encephalopathy -Resolved  Depression/anxiety - Klonopin, 1 mg TID -Lexapro, 10 mg daily -Seroquel 50 mg BID PRN  Hypokalemia -Potassium goal> 4  Hypophosphatemia -See hypokalemia  Stage I sacral decubitus ulcer - Covered with foam padding - Frequent rolling of patient per ICU protocol   Goals of care - 10/14 social work consult; patient has expressed he does not want to return to [facility]   DVT prophylaxis: Lovenox Code Status: DNR Family Communication: 10/17 spoke with sister and brother-in-law explained plan of care answered all  questions   Disposition Plan: TBD   Consultants:  PCCM    Procedures/Significant Events:     I have personally reviewed and interpreted all radiology studies and my findings are as above.  VENTILATOR SETTINGS: Vent mode; PRVC Set rate; 24 Vt set; 500 FiO2; 80% PEEP; 12   Cultures 10/11 MRSA by PCR positive 10/11 tracheal aspirate for flora 10/13 Novel coronavirus negative    Antimicrobials: Anti-infectives (From admission, onward)   Start     Stop   09/03/19 1000  remdesivir 100 mg in sodium chloride 0.9 % 250 mL IVPB     09/06/19 1044   09/02/19 1000  remdesivir 200 mg in sodium chloride 0.9 % 250 mL IVPB     09/02/19 1115       Devices    LINES / TUBES:      Continuous Infusions: . sodium chloride 10 mL/hr at 09/09/19 0700  . feeding supplement (VITAL 1.5 CAL) Stopped (09/08/19 1200)     Objective: Vitals:   09/09/19 0500 09/09/19 0600 09/09/19 0700 09/09/19 0752  BP: 116/75 130/83 119/66   Pulse: 74 (!) 57 (!) 59   Resp: (!) 24 (!) 24 (!) 24   Temp:  98.2 F (36.8 C)  (P) 98.4 F (36.9 C)  TempSrc:  Oral  (P) Oral  SpO2: 99% 98% 95%   Weight:      Height:        Intake/Output Summary (Last 24 hours) at 09/09/2019 0901 Last data filed at 09/09/2019 0700 Gross per 24 hour  Intake 775.37 ml  Output 4150 ml  Net -3374.63 ml   Filed Weights   09/05/19 0500 09/08/19 0500 09/09/19 0412  Weight: 108.6 kg 106.4 kg 106.2 kg   Physical Exam:  General: A/O x4, positive acute respiratory distress (improving anxious to try PMV) Eyes: negative scleral hemorrhage, negative anisocoria, negative icterus ENT: Negative Runny nose, negative gingival bleeding, Neck:  Negative scars, masses, torticollis, lymphadenopathy, JVD Lungs: Tachypneic clear to auscultation bilaterally without wheezes or crackles Cardiovascular: Bradycardic without murmur gallop or rub normal S1 and S2 Abdomen: negative abdominal pain, nondistended, positive soft, bowel  sounds, no rebound, no ascites, no appreciable mass Extremities: No significant cyanosis, clubbing, or edema bilateral lower extremities Skin: Negative rashes, lesions, ulcers Psychiatric:  Negative depression, negative anxiety, negative fatigue, negative mania  Central nervous system:  Cranial nerves II through XII intact, tongue/uvula midline, all extremities muscle strength 5/5, sensation intact throughout,  negative dysarthria, negative expressive aphasia, negative receptive aphasia.      Data Reviewed: Care during the described time interval was provided by me .  I have reviewed this patient's available data, including medical history, events of note, physical examination, and all test results as part of my evaluation.   CBC: Recent Labs  Lab 09/05/19 0546 09/06/19 0540 09/07/19 0555 09/08/19 0600 09/09/19 0605  WBC 19.0* 19.0* 20.7* 19.7* 19.5*  NEUTROABS 17.2* 15.5* 16.4* 16.0* 16.3*  HGB 10.7* 10.5* 11.0* 11.2* 12.0*  HCT 33.6* 33.2* 35.0* 34.9* 36.9*  MCV 99.7 99.1 100.0 98.0 95.3  PLT 369 382 392 402* 440*   Basic  Metabolic Panel: Recent Labs  Lab 09/05/19 0546 09/06/19 0540 09/07/19 0555 09/08/19 0600 09/09/19 0605  NA 140 142 141 137 140  K 3.7 3.3* 3.5 3.9 3.7  CL 99 101 102 97* 96*  CO2 31 32 30 31 32  GLUCOSE 162* 163* 205* 190* 148*  BUN 58* 59* 54* 51* 55*  CREATININE 0.82 0.78 0.75 0.76 0.84  CALCIUM 9.7 9.8 9.6 9.4 9.9  MG 2.0 2.1 2.2 2.2 2.4  PHOS 2.7 1.8* 1.9* 2.9 4.3   GFR: Estimated Creatinine Clearance: 107.8 mL/min (by C-G formula based on SCr of 0.84 mg/dL). Liver Function Tests: Recent Labs  Lab 09/05/19 0546 09/06/19 0540 09/07/19 0555 09/08/19 0600 09/09/19 0605  AST _0 ALT _1 ALKPHOS 37* 38 38 34* 33*  BILITOT 0.7 0.8 0.5 0.5 0.8  PROT 6.2* 6.2* 6.2* 5.9* 6.3*  ALBUMIN 3.0* 3.0* 2.9* 2.8* 3.0*   No results for input(s): LIPASE, AMYLASE in the last 168 hours. No results for input(s): AMMONIA in the  last 168 hours. Coagulation Profile: Recent Labs  Lab 09/05/19 0546 09/06/19 0540 09/07/19 0555 09/08/19 0600 09/09/19 0605  INR 1.1 1.1 1.1 1.1 1.1   Cardiac Enzymes: No results for input(s): CKTOTAL, CKMB, CKMBINDEX, TROPONINI in the last 168 hours. BNP (last 3 results) No results for input(s): PROBNP in the last 8760 hours. HbA1C: No results for input(s): HGBA1C in the last 72 hours. CBG: Recent Labs  Lab 09/08/19 1647 09/08/19 2123 09/08/19 2338 09/09/19 0517 09/09/19 0740  GLUCAP 185* 122* 158* 143* 146*   Lipid Profile: No results for input(s): CHOL, HDL, LDLCALC, TRIG, CHOLHDL, LDLDIRECT in the last 72 hours. Thyroid Function Tests: No results for input(s): TSH, T4TOTAL, FREET4, T3FREE, THYROIDAB in the last 72 hours. Anemia Panel: Recent Labs    09/07/19 0555  FERRITIN 80   Urine analysis:    Component Value Date/Time   COLORURINE RED (A) 09/02/2019 0301   APPEARANCEUR TURBID (A) 09/02/2019 0301   LABSPEC  09/02/2019 0301    TEST NOT REPORTED DUE TO COLOR INTERFERENCE OF URINE PIGMENT   PHURINE  09/02/2019 0301    TEST NOT REPORTED DUE TO COLOR INTERFERENCE OF URINE PIGMENT   GLUCOSEU NEGATIVE 09/02/2019 0301   HGBUR NEGATIVE 09/02/2019 0301   BILIRUBINUR NEGATIVE 09/02/2019 0301   KETONESUR NEGATIVE 09/02/2019 0301   PROTEINUR NEGATIVE 09/02/2019 0301   UROBILINOGEN 0.2 08/11/2010 1041   NITRITE NEGATIVE 09/02/2019 0301   LEUKOCYTESUR NEGATIVE 09/02/2019 0301   Sepsis Labs: _2 (procalcitonin:4,lacticidven:4)  ) Recent Results (from the past 240 hour(s))  MRSA PCR Screening     Status: Abnormal   Collection Time: 09/03/19  4:00 AM   Specimen: Nasal Mucosa; Nasopharyngeal  Result Value Ref Range Status   MRSA by PCR POSITIVE (A) NEGATIVE Final    Comment:        The GeneXpert MRSA Assay (FDA approved for NASAL specimens only), is one component of a comprehensive MRSA colonization surveillance program. It is not intended to diagnose  MRSA infection nor to guide or monitor treatment for MRSA infections. RESULT CALLED TO, READ BACK BY AND VERIFIED WITH: JOBE,H RN _3  ON 09/03/2019 JACKSON,K Performed at [facility], [place] [address] [place], [place] [address]   Culture, respiratory (non-expectorated)     Status: None   Collection Time: 09/03/19  9:18 AM   Specimen: Tracheal Aspirate; Respiratory  Result Value Ref Range Status   Specimen Description   Final  TRACHEAL ASPIRATE Performed at [facility], [place] [address] [place], [place] [address]    Special Requests   Final    NONE Performed at [facility], [place] [address] [place], [place] [address]    Gram Stain NO WBC SEEN RARE GRAM POSITIVE COCCI   Final   Culture   Final    Consistent with normal respiratory flora. Performed at [facility], [place] [address], [place], [place] [address]    Report Status 09/07/2019 FINAL  Final  Novel Coronavirus, NAA (Hosp order, Send-out to Ref Lab; TAT 18-24 hrs     Status: None   Collection Time: 09/05/19  1:35 PM   Specimen: Nasopharyngeal Swab; Respiratory  Result Value Ref Range Status   SARS-CoV-2, NAA NOT DETECTED NOT DETECTED Final    Comment: (NOTE) This nucleic acid amplification test was developed and its performance characteristics determined by [employer]. Nucleic acid amplification tests include PCR and TMA. This test has not been FDA cleared or approved. This test has been authorized by FDA under an Emergency Use Authorization (EUA). This test is only authorized for the duration of time the declaration that circumstances exist justifying the authorization of the emergency use of in vitro diagnostic tests for detection of SARS-CoV-2 virus and/or diagnosis of COVID-19 infection under section 564(b)(1) of the Act, 21 U.S.C. 384YKZ-9(D) (1), unless the authorization is terminated or revoked sooner. When diagnostic testing is  negative, the possibility of a false negative result should be considered in the context of a patient's recent exposures and the presence of clinical signs and symptoms consistent with COVID-19. An individual without symptoms of COVID- 19 and who is not shedding SARS-CoV-2 vi rus would expect to have a negative (not detected) result in this assay. Performed At: [facility] [place], [place] [id number] Rush Farmer MD JQ:[id number]    [place]  Final    Comment: Performed at [place] [address] [place], [place] [address]         Radiology Studies: Dg Chest Port 1 View  Result Date: 09/09/2019 CLINICAL DATA:  Acute respiratory failure with hypoxemia. EXAM: PORTABLE CHEST 1 VIEW COMPARISON:  09/05/2019; 09/02/2019; chest CT-09/03/2019 FINDINGS: Grossly unchanged enlarged cardiac silhouette and mediastinal contours. Stable position of support apparatus. No pneumothorax. Left basilar/retrocardiac heterogeneous/consolidative opacities are unchanged. The right hemithorax remains well aerated. No new focal airspace opacities. Unchanged suspected trace right-sided pleural effusion. No evidence of edema. No acute osseous abnormalities. Suspected degenerative change of the left glenohumeral joint, incompletely evaluated. IMPRESSION: 1.  Stable positioning of support apparatus.  No pneumothorax. 2. Grossly unchanged left basilar/consolidative airspace opacities, atelectasis versus infiltrate. Electronically Signed   By: Sandi Mariscal M.D.   On: 09/09/2019 05:50        Scheduled Meds: . sodium chloride   Intravenous Once  . aspirin  81 mg Per Tube Daily  . atorvastatin  20 mg Per Tube Daily  . carvedilol  12.5 mg Per Tube BID WC  . chlorhexidine  15 mL Mouth/Throat BID  . Chlorhexidine Gluconate Cloth  6 each Topical Daily  . clonazePAM  1 mg Per Tube Q8H  . docusate  100 mg Per Tube Daily  . enoxaparin (LOVENOX)  injection  40 mg Subcutaneous Q24H  . escitalopram  10 mg Per Tube Daily  . famotidine  20 mg Oral QHS  . feeding supplement (PRO-STAT SUGAR FREE 64)  60 mL Per Tube BID  .  insulin aspart  0-5 Units Subcutaneous QHS  . insulin aspart  0-9 Units Subcutaneous TID WC  . ipratropium-albuterol  3 mL Nebulization BID  . methylPREDNISolone (SOLU-MEDROL) injection  55 mg Intravenous Q12H  . morphine  15 mg Per Tube Q8H  . mupirocin ointment   Nasal BID  . polyethylene glycol  17 g Per Tube Daily  . sodium chloride flush  3 mL Intravenous Q12H  . sodium chloride flush  3 mL Intravenous Q12H   Continuous Infusions: . sodium chloride 10 mL/hr at 09/09/19 0700  . feeding supplement (VITAL 1.5 CAL) Stopped (09/08/19 1200)     LOS: 7 days   The patient is critically ill with multiple organ systems failure and requires high complexity decision making for assessment and support, frequent evaluation and titration of therapies, application of advanced monitoring technologies and extensive interpretation of multiple databases. Critical Care Time devoted to patient care services described in this note  Time spent: 40 minutes     WOODS, Geraldo Docker, MD [facility] Pager [phone]  If 7PM-7AM, please contact night-coverage [email or web address] Password [email or web address] 09/09/2019, 9:01 AM

## 2019-09-09 NOTE — Progress Notes (Signed)
NAME:  Sergio Mitchell, MRN:  [id number], DOB:  December 04, 1950, LOS: 60 ADMISSION DATE:  09/02/2019, CONSULTATION DATE:  10/10 REFERRING MD:  Lorin Mercy, CHIEF COMPLAINT:  Hypoxemia   Brief History   68 y/o male admitted from [place] to [facility] on 10/10 for hypoxemia due to COVID 19 pneumonia.  At baseline the patient has ventilator dependent respiratory failure and systolic heart failure.   Past Medical History  Chronic respiratory failure with hypoxemia, ventilator dependent Tracheostomy status Chronic systolic heart faiulre Fibromyalgia Chronic pain Osteoporosis Scoliosis Spinal stenosis  Significant Hospital Events   10/10 admission 10/11 worsening confusion, hypoxemia  Consults:  PCCM  Procedures:  Tracheostomy, chronic (May 2020?)  Significant Diagnostic Tests:    Micro Data:  10/7 SARS COV 2 Positive [facility]) 10/11 resp culture > rare GPC  Antimicrobials/COVID Treatment  10/10 Convalescent plasma > 10/10 decadron >  10/10 remdesivir >  10/14  Interim history/subjective:   Increased work of breathing this morning, on higher FiO2 and PEEP  Objective   Blood pressure 108/67, pulse 73, temperature 98.4 F (36.9 C), temperature source Oral, resp. rate (!) 24, height 6\' 2"  (1.88 m), weight 106.2 kg, SpO2 96 %.    Vent Mode: PRVC FiO2 (%):  [40 %-80 %] 50 % Set Rate:  [24 bmp] 24 bmp Vt Set:  [500 mL] 500 mL PEEP:  [5 RSW54-62 cmH20] 12 cmH20 Pressure Support:  [8 cmH20] 8 cmH20 Plateau Pressure:  [18 cmH20-22 cmH20] 22 cmH20   Intake/Output Summary (Last 24 hours) at 09/09/2019 1948 Last data filed at 09/09/2019 0700 Gross per 24 hour  Intake 565.41 ml  Output 2600 ml  Net -2034.59 ml   Filed Weights   09/05/19 0500 09/08/19 0500 09/09/19 0412  Weight: 108.6 kg 106.4 kg 106.2 kg    Examination:  General:  In bed on vent HENT: NCAT tracheostomy in place PULM: CTA B, vent supported breathing CV: RRR, no mgr GI: BS+, soft, nontender MSK: normal  bulk and tone Neuro: awake on vent, following commands   10/14 CXR > pulmonary parenchymal largely clear, tracheostomy in place  Resolved Hospital Problem list     Assessment & Plan:  Acute on chronic respiratory failure with hypoxemia due to COVID 19 pneumonia, generalized weakness, chronic systolic heart failure (not clearly ARDS): improving 10/14, some pulmonary edema 10/15? Repeat CXR Decadron 10 day course remdesivir completed Tracheostomy care per routine Pressure support vent as able today VAP prevention  Acute on Chronic systolic heart failure Tele Continue coreg Monitor I/O and hemodynamics Lasix today  Physical deconditioning PT/OT consult as able   Best practice:  Diet: tube feeding, allow to take food by mouth as well Pain/Anxiety/Delirium protocol (if indicated): no VAP protocol (if indicated): yes DVT prophylaxis: Lovenox low dose per COVID PACT Trial GI prophylaxis: lansoprazole for stress ulcer prophylaxis Glucose control: SSI Mobility: bed rest Code Status: DNR Family Communication: per [facility] Disposition: remain in ICU  Labs   CBC: Recent Labs  Lab 09/05/19 0546 09/06/19 0540 09/07/19 0555 09/08/19 0600 09/09/19 0605  WBC 19.0* 19.0* 20.7* 19.7* 19.5*  NEUTROABS 17.2* 15.5* 16.4* 16.0* 16.3*  HGB 10.7* 10.5* 11.0* 11.2* 12.0*  HCT 33.6* 33.2* 35.0* 34.9* 36.9*  MCV 99.7 99.1 100.0 98.0 95.3  PLT 369 382 392 402* 440*    Basic Metabolic Panel: Recent Labs  Lab 09/05/19 0546 09/06/19 0540 09/07/19 0555 09/08/19 0600 09/09/19 0605  NA 140 142 141 137 140  K 3.7 3.3* 3.5 3.9 3.7  CL 99  101 102 97* 96*  CO2 31 32 30 31 32  GLUCOSE 162* 163* 205* 190* 148*  BUN 58* 59* 54* 51* 55*  CREATININE 0.82 0.78 0.75 0.76 0.84  CALCIUM 9.7 9.8 9.6 9.4 9.9  MG 2.0 2.1 2.2 2.2 2.4  PHOS 2.7 1.8* 1.9* 2.9 4.3   GFR: Estimated Creatinine Clearance: 107.8 mL/min (by C-G formula based on SCr of 0.84 mg/dL). Recent Labs  Lab 09/06/19 0540  09/07/19 0555 09/08/19 0600 09/09/19 0605  WBC 19.0* 20.7* 19.7* 19.5*    Liver Function Tests: Recent Labs  Lab 09/05/19 0546 09/06/19 0540 09/07/19 0555 09/08/19 0600 09/09/19 0605  AST 18 17 17 27 26   ALT 21 21 22 29 31   ALKPHOS 37* 38 38 34* 33*  BILITOT 0.7 0.8 0.5 0.5 0.8  PROT 6.2* 6.2* 6.2* 5.9* 6.3*  ALBUMIN 3.0* 3.0* 2.9* 2.8* 3.0*   No results for input(s): LIPASE, AMYLASE in the last 168 hours. No results for input(s): AMMONIA in the last 168 hours.  ABG    Component Value Date/Time   PHART 7.485 (H) 09/02/2019 0234   PCO2ART 51.2 (H) 09/02/2019 0234   PO2ART 152.0 (H) 09/02/2019 0234   HCO3 38.3 (H) 09/02/2019 0234   TCO2 40 (H) 09/02/2019 0234   O2SAT 99.0 09/02/2019 0234     Coagulation Profile: Recent Labs  Lab 09/05/19 0546 09/06/19 0540 09/07/19 0555 09/08/19 0600 09/09/19 0605  INR 1.1 1.1 1.1 1.1 1.1    Cardiac Enzymes: No results for input(s): CKTOTAL, CKMB, CKMBINDEX, TROPONINI in the last 168 hours.  HbA1C: No results found for: HGBA1C  CBG: Recent Labs  Lab 09/08/19 2338 09/09/19 0517 09/09/19 0740 09/09/19 1236 09/09/19 1544  GLUCAP 158* 143* 146* 197* 189*      Critical care time: 31 minutes

## 2019-09-10 DIAGNOSIS — U071 COVID-19: Secondary | ICD-10-CM | POA: Diagnosis not present

## 2019-09-10 DIAGNOSIS — L89151 Pressure ulcer of sacral region, stage 1: Secondary | ICD-10-CM

## 2019-09-10 DIAGNOSIS — J9621 Acute and chronic respiratory failure with hypoxia: Secondary | ICD-10-CM | POA: Diagnosis not present

## 2019-09-10 DIAGNOSIS — J9622 Acute and chronic respiratory failure with hypercapnia: Secondary | ICD-10-CM | POA: Diagnosis not present

## 2019-09-10 DIAGNOSIS — J9601 Acute respiratory failure with hypoxia: Secondary | ICD-10-CM | POA: Diagnosis not present

## 2019-09-10 DIAGNOSIS — I5022 Chronic systolic (congestive) heart failure: Secondary | ICD-10-CM | POA: Diagnosis not present

## 2019-09-10 DIAGNOSIS — I480 Paroxysmal atrial fibrillation: Secondary | ICD-10-CM | POA: Diagnosis not present

## 2019-09-10 DIAGNOSIS — L8941 Pressure ulcer of contiguous site of back, buttock and hip, stage 1: Secondary | ICD-10-CM

## 2019-09-10 LAB — COMPREHENSIVE METABOLIC PANEL
ALT: 30 U/L (ref 0–44)
AST: 27 U/L (ref 15–41)
Albumin: 2.9 g/dL — ABNORMAL LOW (ref 3.5–5.0)
Alkaline Phosphatase: 32 U/L — ABNORMAL LOW (ref 38–126)
Anion gap: 12 (ref 5–15)
BUN: 63 mg/dL — ABNORMAL HIGH (ref 8–23)
CO2: 30 mmol/L (ref 22–32)
Calcium: 9.8 mg/dL (ref 8.9–10.3)
Chloride: 94 mmol/L — ABNORMAL LOW (ref 98–111)
Creatinine, Ser: 0.95 mg/dL (ref 0.61–1.24)
GFR calc Af Amer: >60 mL/min (ref >60)
GFR calc non Af Amer: >60 mL/min (ref >60)
Glucose, Bld: 137 mg/dL — ABNORMAL HIGH (ref 70–99)
Potassium: 3.5 mmol/L (ref 3.5–5.1)
Sodium: 136 mmol/L (ref 135–145)
Total Bilirubin: 0.3 mg/dL (ref 0.3–1.2)
Total Protein: 5.9 g/dL — ABNORMAL LOW (ref 6.5–8.1)

## 2019-09-10 LAB — CBC WITH DIFFERENTIAL/PLATELET
Abs Immature Granulocytes: 0.32 10*3/uL — ABNORMAL HIGH (ref 0.00–0.07)
Basophils Absolute: 0 10*3/uL (ref 0.0–0.1)
Basophils Relative: 0 %
Eosinophils Absolute: 0 10*3/uL (ref 0.0–0.5)
Eosinophils Relative: 0 %
HCT: 35 % — ABNORMAL LOW (ref 39.0–52.0)
Hemoglobin: 11.5 g/dL — ABNORMAL LOW (ref 13.0–17.0)
Immature Granulocytes: 2 %
Lymphocytes Relative: 6 %
Lymphs Abs: 1.2 10*3/uL (ref 0.7–4.0)
MCH: 31.3 pg (ref 26.0–34.0)
MCHC: 32.9 g/dL (ref 30.0–36.0)
MCV: 95.4 fL (ref 80.0–100.0)
Monocytes Absolute: 1.5 10*3/uL — ABNORMAL HIGH (ref 0.1–1.0)
Monocytes Relative: 8 %
Neutro Abs: 16.1 10*3/uL — ABNORMAL HIGH (ref 1.7–7.7)
Neutrophils Relative %: 84 %
Platelets: 424 10*3/uL — ABNORMAL HIGH (ref 150–400)
RBC: 3.67 MIL/uL — ABNORMAL LOW (ref 4.22–5.81)
RDW: 13.7 % (ref 11.5–15.5)
WBC: 19.1 10*3/uL — ABNORMAL HIGH (ref 4.0–10.5)
nRBC: 0 % (ref 0.0–0.2)

## 2019-09-10 LAB — PROTIME-INR
INR: 1.1 (ref 0.8–1.2)
Prothrombin Time: 14 seconds (ref 11.4–15.2)

## 2019-09-10 LAB — D-DIMER, QUANTITATIVE: D-Dimer, Quant: 0.79 ug/mL-FEU — ABNORMAL HIGH (ref 0.00–0.50)

## 2019-09-10 LAB — GLUCOSE, CAPILLARY
Glucose-Capillary: 135 mg/dL — ABNORMAL HIGH (ref 70–99)
Glucose-Capillary: 163 mg/dL — ABNORMAL HIGH (ref 70–99)
Glucose-Capillary: 139 mg/dL — ABNORMAL HIGH (ref 70–99)
Glucose-Capillary: 158 mg/dL — ABNORMAL HIGH (ref 70–99)

## 2019-09-10 LAB — PHOSPHORUS: Phosphorus: 3.9 mg/dL (ref 2.5–4.6)

## 2019-09-10 LAB — MAGNESIUM: Magnesium: 2.3 mg/dL (ref 1.7–2.4)

## 2019-09-10 LAB — C-REACTIVE PROTEIN: CRP: <0.8 mg/dL (ref <1.0)

## 2019-09-10 MED ORDER — POTASSIUM CHLORIDE 20 MEQ/15ML (10%) PO SOLN
40.0000 meq | Freq: Once | ORAL | Status: AC
  Administered 2019-09-10: 40 meq
  Filled 2019-09-10: qty 30

## 2019-09-10 MED ORDER — FUROSEMIDE 10 MG/ML IJ SOLN
40.0000 mg | Freq: Two times a day (BID) | INTRAMUSCULAR | Status: AC
  Administered 2019-09-10 – 2019-09-11 (×2): 40 mg via INTRAVENOUS
  Filled 2019-09-10 (×2): qty 4

## 2019-09-10 NOTE — Progress Notes (Signed)
NAME:  Sergio Mitchell, MRN:  [id number], DOB:  31-Aug-1951, LOS: 8 ADMISSION DATE:  09/02/2019, CONSULTATION DATE:  10/10 REFERRING MD:  Ophelia Charter, CHIEF COMPLAINT:  Hypoxemia   Brief History   68 y/o male admitted from [facility] to [facility] on 10/10 for hypoxemia due to COVID 19 pneumonia.  At baseline the patient has ventilator dependent respiratory failure and systolic heart failure.   Past Medical History  Chronic respiratory failure with hypoxemia, ventilator dependent Tracheostomy status Chronic systolic heart faiulre Fibromyalgia Chronic pain Osteoporosis Scoliosis Spinal stenosis  Significant Hospital Events   10/10 admission 10/11 worsening confusion, hypoxemia  Consults:  PCCM  Procedures:  Tracheostomy, chronic (May 2020?)  Significant Diagnostic Tests:    Micro Data:  10/7 SARS COV 2 Positive [facility]) 10/11 resp culture > rare GPC  Antimicrobials/COVID Treatment  10/10 Convalescent plasma >   10/10 - Methylpred 55 BID  10/10 remdesivir >  10/14  Interim history/subjective:  Doing well today.  Tolerated PSV trial yesterday, but when sleeping decreased RR and lower TV, desaturated.  Today tolerating PRVC well.    Objective   Blood pressure (!) 146/88, pulse 65, temperature 98.4 F (36.9 C), temperature source Oral, resp. rate (!) 24, height 6\' 2"  (1.88 m), weight 105.6 kg, SpO2 91 %.    Vent Mode: PRVC FiO2 (%):  [40 %-50 %] 40 % Set Rate:  [24 bmp] 24 bmp Vt Set:  [500 mL] 500 mL PEEP:  [10 cmH20-12 cmH20] 10 cmH20 Plateau Pressure:  [19 cmH20-28 cmH20] 28 cmH20   Intake/Output Summary (Last 24 hours) at 09/10/2019 1713 Last data filed at 09/10/2019 1200 Gross per 24 hour  Intake 440.42 ml  Output 1450 ml  Net -1009.58 ml   Filed Weights   09/08/19 0500 09/09/19 0412 09/10/19 0500  Weight: 106.4 kg 106.2 kg 105.6 kg    Examination:  General:  In bed on vent, alert, mouthing sentences, complaining about condom cath HENT: NCAT  tracheostomy in place PULM: CTA B, vent supported breathing CV: RRR, no mgr GI: BS+, soft, nontender MSK: normal bulk and tone Neuro: awake on vent, following commands   10/14 CXR > pulmonary parenchymal largely clear, tracheostomy in place 10/17 Stable positioning of support apparatus.  No pneumothorax. 2. Grossly unchanged left basilar/consolidative airspace opacities, atelectasis versus infiltrate.  Resolved Hospital Problem list     Assessment & Plan:  Acute on chronic respiratory failure with hypoxemia due to COVID 19 pneumonia, generalized weakness, chronic systolic heart failure (not clearly ARDS): improving 10/14, some pulmonary edema 10/15? Atelectasis on CXR yesterday on LLL.  Repeat CXR tomorrow.  Today will do trial on lower peep ( down to 10 ).  Consider repeat PSV trial if tolerates Solumedrol since 10/10.   remdesivir completed Tracheostomy care per routine VAP prevention RR set at 24  Acute on Chronic systolic heart failure Tele Continue coreg Monitor I/O and hemodynamics Hold lasix, urinating well on own, cr slightly increasing.   CR slightly increasing: trending up.  BUN trending up.  Monitor. (0.95 from 0.7s) Hold diuresis.  (net neg today on own)  Physical deconditioning PT/OT consult as able   Best practice:  Diet: tube feeding, allow to take food by mouth as well Pain/Anxiety/Delirium protocol (if indicated): no VAP protocol (if indicated): yes DVT prophylaxis: Lovenox low dose per COVID PACT Trial GI prophylaxis: famotidine for stress ulcer prophylaxis Glucose control: SSI Mobility: bed rest Code Status: DNR Family Communication: per [facility] Disposition: remain in ICU  Labs  CBC: Recent Labs  Lab 09/06/19 0540 09/07/19 0555 09/08/19 0600 09/09/19 0605 09/10/19 0557  WBC 19.0* 20.7* 19.7* 19.5* 19.1*  NEUTROABS 15.5* 16.4* 16.0* 16.3* 16.1*  HGB 10.5* 11.0* 11.2* 12.0* 11.5*  HCT 33.2* 35.0* 34.9* 36.9* 35.0*  MCV 99.1 100.0 98.0  95.3 95.4  PLT 382 392 402* 440* 424*    Basic Metabolic Panel: Recent Labs  Lab 09/06/19 0540 09/07/19 0555 09/08/19 0600 09/09/19 0605 09/10/19 0557  NA 142 141 137 140 136  K 3.3* 3.5 3.9 3.7 3.5  CL 101 102 97* 96* 94*  CO2 32 30 31 32 30  GLUCOSE 163* 205* 190* 148* 137*  BUN 59* 54* 51* 55* 63*  CREATININE 0.78 0.75 0.76 0.84 0.95  CALCIUM 9.8 9.6 9.4 9.9 9.8  MG 2.1 2.2 2.2 2.4 2.3  PHOS 1.8* 1.9* 2.9 4.3 3.9   GFR: Estimated Creatinine Clearance: 95.1 mL/min (by C-G formula based on SCr of 0.95 mg/dL). Recent Labs  Lab 09/07/19 0555 09/08/19 0600 09/09/19 0605 09/10/19 0557  WBC 20.7* 19.7* 19.5* 19.1*    Liver Function Tests: Recent Labs  Lab 09/06/19 0540 09/07/19 0555 09/08/19 0600 09/09/19 0605 09/10/19 0557  AST 17 17 27 26 27   ALT 21 22 29 31 30   ALKPHOS 38 38 34* 33* 32*  BILITOT 0.8 0.5 0.5 0.8 0.3  PROT 6.2* 6.2* 5.9* 6.3* 5.9*  ALBUMIN 3.0* 2.9* 2.8* 3.0* 2.9*   No results for input(s): LIPASE, AMYLASE in the last 168 hours. No results for input(s): AMMONIA in the last 168 hours.  ABG    Component Value Date/Time   PHART 7.485 (H) 09/02/2019 0234   PCO2ART 51.2 (H) 09/02/2019 0234   PO2ART 152.0 (H) 09/02/2019 0234   HCO3 38.3 (H) 09/02/2019 0234   TCO2 40 (H) 09/02/2019 0234   O2SAT 99.0 09/02/2019 0234     Coagulation Profile: Recent Labs  Lab 09/06/19 0540 09/07/19 0555 09/08/19 0600 09/09/19 0605 09/10/19 0557  INR 1.1 1.1 1.1 1.1 1.1    Cardiac Enzymes: No results for input(s): CKTOTAL, CKMB, CKMBINDEX, TROPONINI in the last 168 hours.  HbA1C: No results found for: HGBA1C  CBG: Recent Labs  Lab 09/09/19 1544 09/09/19 2217 09/10/19 0819 09/10/19 1211 09/10/19 1613  GLUCAP 189* 117* 135* 163* 139*      Critical care time: 31 minutes

## 2019-09-10 NOTE — Progress Notes (Signed)
PROGRESS NOTE    Sergio Mitchell  OZY:[id number] DOB: [birth year] DOA: 09/02/2019 PCP: [name], MD   Brief Narrative:  68 yo Male PMHx TRACH-dependent respiratory failure with PEG tube; chronic systolic CHF; and afib presenting with COVID-19 infection.   The patient is a tragic one who appeared to be in reasonably good health other than fibromyalgia and chronic pain until he was admitted at [facility] on 5/25. He presented with CP and SOB and eventually was diagnosed with cardiogenic shock with EF 20-25%, recommended for AICD. However, he also developed septic shock from HCAP with BAL positive for H flu. He required trach/peg and was discharged to [facility]. He also had encephalopathy with anxiety and was treated by psychiatry. He was admitted to [facility] on 7/15 and has been tolerating some CPAP and T-bar trials. He was treated recently [facility] for HCAP and was sent to [facility] for evaluation; I consulted on him on 9/7 and he was stable for d/c back to [facility] at that time.  Returned to ED with COVID-19 positive labs and worsening hypoxia.  I spoke with his sister and brother-in-law. They had many excellent questions about treatments and prognosis and understand the severity of his condition.  His sister reports that he would prefer to be DNR at this time. [name] who is trach and Vent dependent from [facility] diagnosed with Covid 19 and sent to the er as they dont keep Covid pt. He seems to be at his baseline.   Subjective: 10/18 A/O x4, request writing board in order to communicate better.  Follows all commands, eating his meals.   Assessment & Plan:   Principal Problem:   Acute on chronic respiratory failure with hypoxia and hypercapnia (HCC) Active Problems:   Atrial fibrillation (HCC)   Acute respiratory disease due to COVID-19 virus   Respiratory tract infection due to COVID-19 virus   Chronic systolic CHF (congestive heart failure) (HCC)   Pressure injury of skin   AF (paroxysmal atrial fibrillation) (HCC)   Decubitus ulcer of sacral region, stage 1  Acute on chronic respiratory failure with hypoxia and hypercapnia/Covid pneumonia - VAP 1 month prior to admission treated with cefepime and vancomycin.  Resolved patient was able to come off ventilator. - Worsening hypoxia starting 3 weeks prior to hospitalization.  Placed back on ventilator~2 weeks prior to hospitalization. - CTA; negative PE, LEFT sided atelectasis and inferior lung collapse (chronic) - Complete Remdesivir per pharmacy; stop date 10/14 - Solu-Medrol 55 mg BID stop date 10/19 - 10/11 COVID convalescent plasma x1 dose -10/15 patient with frothy clear discharge from trach receiving Lasix today.  If continued in a.m. consider adding Robinul -10/15 tolerated trach collar for~6 hours -10/18 wean patient as tolerated; patient did not tolerate pressure support today, but made some improvement.  See vent settings below.  Marland Kitchen  COVID-19 Labs  Recent Labs    09/08/19 0600 09/09/19 0605  DDIMER 0.92* 0.94*  CRP <0.8 <0.8   Lab Results  Component Value Date   SARSCOV2NAA NOT DETECTED 09/05/2019   [place] NEGATIVE 04/88/8916   Chronic systolic CHF - Strict in and out -4.8 L -Daily weight Filed Weights   09/08/19 0500 09/09/19 0412 09/10/19 0500  Weight: 106.4 kg 106.2 kg 105.6 kg  -Coreg 12.5 mg BID -Lasix PRN -10/18 Lasix 40 mg x 2 doses  A. fib rate controlled -10/14 currently NSR -See CHF  Acute metabolic encephalopathy -Resolved  Depression/anxiety - Klonopin, 1 mg TID -Lexapro, 10 mg daily -Seroquel  50 mg BID PRN  Hypokalemia -Potassium goal> 4 -10/18 potassium 40 mEq  Hypophosphatemia   Stage I sacral decubitus ulcer - Covered with foam padding - Frequent rolling of patient per ICU protocol   Goals of care - 10/14 social work consult; patient has expressed he does not want to return to [facility] -10/18 discussed case with NCM Elenor Quinones.  She is  looking into matter.   DVT prophylaxis: Lovenox Code Status: DNR Family Communication: 10/18 spoke with sister and brother-in-law explained plan of care answered all questions   Disposition Plan: TBD   Consultants:  PCCM    Procedures/Significant Events:     I have personally reviewed and interpreted all radiology studies and my findings are as above.  VENTILATOR SETTINGS: Vent mode; PRVC Set rate; 24 Vt set; 500 FiO2; 40% PEEP; 10   Cultures 10/11 MRSA by PCR positive 10/11 tracheal aspirate for flora 10/13 Novel coronavirus negative    Antimicrobials: Anti-infectives (From admission, onward)   Start     Stop   09/03/19 1000  remdesivir 100 mg in sodium chloride 0.9 % 250 mL IVPB     09/06/19 1044   09/02/19 1000  remdesivir 200 mg in sodium chloride 0.9 % 250 mL IVPB     09/02/19 1115       Devices    LINES / TUBES:      Continuous Infusions:  sodium chloride 250 mL (09/09/19 2229)   feeding supplement (VITAL 1.5 CAL) Stopped (09/08/19 1200)     Objective: Vitals:   09/10/19 0400 09/10/19 0500 09/10/19 0600 09/10/19 0700  BP: 134/77 (!) 156/83 (!) 143/90 123/77  Pulse: 62 (!) 51 (!) 50 (!) 45  Resp: (!) 23 (!) 24 (!) 24 (!) 24  Temp:  97.7 F (36.5 C)    TempSrc:  Axillary    SpO2: 91% 95% 95% 93%  Weight:  105.6 kg    Height:        Intake/Output Summary (Last 24 hours) at 09/10/2019 0824 Last data filed at 09/10/2019 0600 Gross per 24 hour  Intake 105.67 ml  Output 950 ml  Net -844.33 ml   Filed Weights   09/08/19 0500 09/09/19 0412 09/10/19 0500  Weight: 106.4 kg 106.2 kg 105.6 kg    Physical Exam:  General: A/O x4, positive acute respiratory distress Eyes: negative scleral hemorrhage, negative anisocoria, negative icterus ENT: Negative Runny nose, negative gingival bleeding, Neck:  Negative scars, masses, torticollis, lymphadenopathy, JVD #7 cuffed trach in place negative sign of infection Lungs: Tachypneic clear to  auscultation bilaterally without wheezes or crackles Cardiovascular: Bradycardic without murmur gallop or rub normal S1 and S2 Abdomen: negative abdominal pain, nondistended, positive soft, bowel sounds, no rebound, no ascites, no appreciable mass.  PEG tube in place covered and clean negative sign of infection. Extremities: No significant cyanosis, clubbing, or edema bilateral lower extremities Skin: Negative rashes, lesions, ulcers Psychiatric:  Negative depression, negative anxiety, negative fatigue, negative mania  Central nervous system:  Cranial nerves II through XII intact, tongue/uvula midline, all extremities muscle strength 5/5, sensation intact throughout,  negative dysarthria, negative expressive aphasia, negative receptive aphasia.     Data Reviewed: Care during the described time interval was provided by me .  I have reviewed this patient's available data, including medical history, events of note, physical examination, and all test results as part of my evaluation.   CBC: Recent Labs  Lab 09/06/19 0540 09/07/19 0555 09/08/19 0600 09/09/19 0605 09/10/19 0557  WBC 19.0* 20.7*  19.7* 19.5* 19.1*  NEUTROABS 15.5* 16.4* 16.0* 16.3* 16.1*  HGB 10.5* 11.0* 11.2* 12.0* 11.5*  HCT 33.2* 35.0* 34.9* 36.9* 35.0*  MCV 99.1 100.0 98.0 95.3 95.4  PLT 382 392 402* 440* 659*   Basic Metabolic Panel: Recent Labs  Lab 09/05/19 0546 09/06/19 0540 09/07/19 0555 09/08/19 0600 09/09/19 0605  NA 140 142 141 137 140  K 3.7 3.3* 3.5 3.9 3.7  CL 99 101 102 97* 96*  CO2 31 32 30 31 32  GLUCOSE 162* 163* 205* 190* 148*  BUN 58* 59* 54* 51* 55*  CREATININE 0.82 0.78 0.75 0.76 0.84  CALCIUM 9.7 9.8 9.6 9.4 9.9  MG 2.0 2.1 2.2 2.2 2.4  PHOS 2.7 1.8* 1.9* 2.9 4.3   GFR: Estimated Creatinine Clearance: 107.5 mL/min (by C-G formula based on SCr of 0.84 mg/dL). Liver Function Tests: Recent Labs  Lab 09/05/19 0546 09/06/19 0540 09/07/19 0555 09/08/19 0600 09/09/19 0605  AST _0 ALT _1 ALKPHOS 37* 38 38 34* 33*  BILITOT 0.7 0.8 0.5 0.5 0.8  PROT 6.2* 6.2* 6.2* 5.9* 6.3*  ALBUMIN 3.0* 3.0* 2.9* 2.8* 3.0*   No results for input(s): LIPASE, AMYLASE in the last 168 hours. No results for input(s): AMMONIA in the last 168 hours. Coagulation Profile: Recent Labs  Lab 09/05/19 0546 09/06/19 0540 09/07/19 0555 09/08/19 0600 09/09/19 0605  INR 1.1 1.1 1.1 1.1 1.1   Cardiac Enzymes: No results for input(s): CKTOTAL, CKMB, CKMBINDEX, TROPONINI in the last 168 hours. BNP (last 3 results) No results for input(s): PROBNP in the last 8760 hours. HbA1C: No results for input(s): HGBA1C in the last 72 hours. CBG: Recent Labs  Lab 09/09/19 0517 09/09/19 0740 09/09/19 1236 09/09/19 1544 09/09/19 2217  GLUCAP 143* 146* 197* 189* 117*   Lipid Profile: No results for input(s): CHOL, HDL, LDLCALC, TRIG, CHOLHDL, LDLDIRECT in the last 72 hours. Thyroid Function Tests: No results for input(s): TSH, T4TOTAL, FREET4, T3FREE, THYROIDAB in the last 72 hours. Anemia Panel: No results for input(s): VITAMINB12, FOLATE, FERRITIN, TIBC, IRON, RETICCTPCT in the last 72 hours. Urine analysis:    Component Value Date/Time   COLORURINE RED (A) 09/02/2019 0301   APPEARANCEUR TURBID (A) 09/02/2019 0301   LABSPEC  09/02/2019 0301    TEST NOT REPORTED DUE TO COLOR INTERFERENCE OF URINE PIGMENT   PHURINE  09/02/2019 0301    TEST NOT REPORTED DUE TO COLOR INTERFERENCE OF URINE PIGMENT   GLUCOSEU NEGATIVE 09/02/2019 0301   HGBUR NEGATIVE 09/02/2019 0301   BILIRUBINUR NEGATIVE 09/02/2019 0301   KETONESUR NEGATIVE 09/02/2019 0301   PROTEINUR NEGATIVE 09/02/2019 0301   UROBILINOGEN 0.2 08/11/2010 1041   NITRITE NEGATIVE 09/02/2019 0301   LEUKOCYTESUR NEGATIVE 09/02/2019 0301   Sepsis Labs: _2 (procalcitonin:4,lacticidven:4)  ) Recent Results (from the past 240 hour(s))  MRSA PCR Screening     Status: Abnormal   Collection Time: 09/03/19  4:00 AM     Specimen: Nasal Mucosa; Nasopharyngeal  Result Value Ref Range Status   MRSA by PCR POSITIVE (A) NEGATIVE Final    Comment:        The GeneXpert MRSA Assay (FDA approved for NASAL specimens only), is one component of a comprehensive MRSA colonization surveillance program. It is not intended to diagnose MRSA infection nor to guide or monitor treatment for MRSA infections. RESULT CALLED TO, READ BACK BY AND VERIFIED WITH: JOBE,H RN _3  ON 09/03/2019 JACKSON,K Performed at [facility], [address] [place], [place] [address]   Culture, respiratory (non-expectorated)     Status: None   Collection Time: 09/03/19  9:18 AM   Specimen: Tracheal Aspirate; Respiratory  Result Value Ref Range Status   Specimen Description   Final    TRACHEAL ASPIRATE Performed at [facility] [address], [place], [place] [address]    Special Requests   Final    NONE Performed at [facility], [place] [address] [place], [place] [address]    Gram Stain NO WBC SEEN RARE GRAM POSITIVE COCCI   Final   Culture   Final    Consistent with normal respiratory flora. Performed at [facility], [place] [address] [place], [place] [address]    Report Status 09/07/2019 FINAL  Final  Novel Coronavirus, NAA (Hosp order, Send-out to Ref Lab; TAT 18-24 hrs     Status: None   Collection Time: 09/05/19  1:35 PM   Specimen: Nasopharyngeal Swab; Respiratory  Result Value Ref Range Status   SARS-CoV-2, NAA NOT DETECTED NOT DETECTED Final    Comment: (NOTE) This nucleic acid amplification test was developed and its performance characteristics determined by [employer]. Nucleic acid amplification tests include PCR and TMA. This test has not been FDA cleared or approved. This test has been authorized by FDA under an Emergency Use Authorization (EUA). This test is only authorized for the duration of time the declaration that circumstances  exist justifying the authorization of the emergency use of in vitro diagnostic tests for detection of SARS-CoV-2 virus and/or diagnosis of COVID-19 infection under section 564(b)(1) of the Act, 21 U.S.C. 175ZWC-5(E) (1), unless the authorization is terminated or revoked sooner. When diagnostic testing is negative, the possibility of a false negative result should be considered in the context of a patient's recent exposures and the presence of clinical signs and symptoms consistent with COVID-19. An individual without symptoms of COVID- 19 and who is not shedding SARS-CoV-2 vi rus would expect to have a negative (not detected) result in this assay. Performed At: [facility] [place], [place] [id number] Rush Farmer MD NT:[id number]    [place]  Final    Comment: Performed at [facility] [address] [place], [place] [address]         Radiology Studies: Dg Chest Port 1 View  Result Date: 09/09/2019 CLINICAL DATA:  Acute respiratory failure with hypoxemia. EXAM: PORTABLE CHEST 1 VIEW COMPARISON:  09/05/2019; 09/02/2019; chest CT-09/03/2019 FINDINGS: Grossly unchanged enlarged cardiac silhouette and mediastinal contours. Stable position of support apparatus. No pneumothorax. Left basilar/retrocardiac heterogeneous/consolidative opacities are unchanged. The right hemithorax remains well aerated. No new focal airspace opacities. Unchanged suspected trace right-sided pleural effusion. No evidence of edema. No acute osseous abnormalities. Suspected degenerative change of the left glenohumeral joint, incompletely evaluated. IMPRESSION: 1.  Stable positioning of support apparatus.  No pneumothorax. 2. Grossly unchanged left basilar/consolidative airspace opacities, atelectasis versus infiltrate. Electronically Signed   By: Sandi Mariscal M.D.   On: 09/09/2019 05:50        Scheduled Meds:  sodium chloride   Intravenous  Once   aspirin  81 mg Per Tube Daily   atorvastatin  20 mg Per Tube Daily   carvedilol  12.5 mg Per Tube BID WC   chlorhexidine  15 mL Mouth/Throat BID   Chlorhexidine Gluconate Cloth  6 each Topical Daily   clonazePAM  1 mg Per Tube Q8H   docusate  100  mg Per Tube Daily   enoxaparin (LOVENOX) injection  40 mg Subcutaneous Q24H   escitalopram  10 mg Per Tube Daily   famotidine  20 mg Oral QHS   feeding supplement (PRO-STAT SUGAR FREE 64)  60 mL Per Tube BID   insulin aspart  0-5 Units Subcutaneous QHS   insulin aspart  0-9 Units Subcutaneous TID WC   ipratropium-albuterol  3 mL Nebulization BID   methylPREDNISolone (SOLU-MEDROL) injection  55 mg Intravenous Q12H   morphine  15 mg Per Tube Q8H   mupirocin ointment   Nasal BID   polyethylene glycol  17 g Per Tube Daily   sodium chloride flush  3 mL Intravenous Q12H   sodium chloride flush  3 mL Intravenous Q12H   Continuous Infusions:  sodium chloride 250 mL (09/09/19 2229)   feeding supplement (VITAL 1.5 CAL) Stopped (09/08/19 1200)     LOS: 8 days   The patient is critically ill with multiple organ systems failure and requires high complexity decision making for assessment and support, frequent evaluation and titration of therapies, application of advanced monitoring technologies and extensive interpretation of multiple databases. Critical Care Time devoted to patient care services described in this note  Time spent: 40 minutes     Ashyia Schraeder, Geraldo Docker, MD [facility] Pager [phone]  If 7PM-7AM, please contact night-coverage [email or web address] Password [email or web address] 09/10/2019, 8:24 AM

## 2019-09-11 DIAGNOSIS — J9621 Acute and chronic respiratory failure with hypoxia: Secondary | ICD-10-CM | POA: Diagnosis not present

## 2019-09-11 DIAGNOSIS — U071 COVID-19: Secondary | ICD-10-CM | POA: Diagnosis not present

## 2019-09-11 DIAGNOSIS — J9601 Acute respiratory failure with hypoxia: Secondary | ICD-10-CM | POA: Diagnosis not present

## 2019-09-11 DIAGNOSIS — J9622 Acute and chronic respiratory failure with hypercapnia: Secondary | ICD-10-CM | POA: Diagnosis not present

## 2019-09-11 DIAGNOSIS — I480 Paroxysmal atrial fibrillation: Secondary | ICD-10-CM | POA: Diagnosis not present

## 2019-09-11 LAB — C-REACTIVE PROTEIN: CRP: <0.8 mg/dL (ref <1.0)

## 2019-09-11 LAB — CBC WITH DIFFERENTIAL/PLATELET
Abs Immature Granulocytes: 0.22 10*3/uL — ABNORMAL HIGH (ref 0.00–0.07)
Basophils Absolute: 0 10*3/uL (ref 0.0–0.1)
Basophils Relative: 0 %
Eosinophils Absolute: 0 10*3/uL (ref 0.0–0.5)
Eosinophils Relative: 0 %
HCT: 35.9 % — ABNORMAL LOW (ref 39.0–52.0)
Hemoglobin: 11.9 g/dL — ABNORMAL LOW (ref 13.0–17.0)
Immature Granulocytes: 1 %
Lymphocytes Relative: 6 %
Lymphs Abs: 1.1 10*3/uL (ref 0.7–4.0)
MCH: 31.4 pg (ref 26.0–34.0)
MCHC: 33.1 g/dL (ref 30.0–36.0)
MCV: 94.7 fL (ref 80.0–100.0)
Monocytes Absolute: 1.5 10*3/uL — ABNORMAL HIGH (ref 0.1–1.0)
Monocytes Relative: 8 %
Neutro Abs: 15.4 10*3/uL — ABNORMAL HIGH (ref 1.7–7.7)
Neutrophils Relative %: 85 %
Platelets: 396 10*3/uL (ref 150–400)
RBC: 3.79 MIL/uL — ABNORMAL LOW (ref 4.22–5.81)
RDW: 13.8 % (ref 11.5–15.5)
WBC: 18.3 10*3/uL — ABNORMAL HIGH (ref 4.0–10.5)
nRBC: 0 % (ref 0.0–0.2)

## 2019-09-11 LAB — COMPREHENSIVE METABOLIC PANEL
ALT: 32 U/L (ref 0–44)
AST: 24 U/L (ref 15–41)
Albumin: 3 g/dL — ABNORMAL LOW (ref 3.5–5.0)
Alkaline Phosphatase: 33 U/L — ABNORMAL LOW (ref 38–126)
Anion gap: 13 (ref 5–15)
BUN: 60 mg/dL — ABNORMAL HIGH (ref 8–23)
CO2: 28 mmol/L (ref 22–32)
Calcium: 9.7 mg/dL (ref 8.9–10.3)
Chloride: 92 mmol/L — ABNORMAL LOW (ref 98–111)
Creatinine, Ser: 0.96 mg/dL (ref 0.61–1.24)
GFR calc Af Amer: >60 mL/min (ref >60)
GFR calc non Af Amer: >60 mL/min (ref >60)
Glucose, Bld: 143 mg/dL — ABNORMAL HIGH (ref 70–99)
Potassium: 3.3 mmol/L — ABNORMAL LOW (ref 3.5–5.1)
Sodium: 133 mmol/L — ABNORMAL LOW (ref 135–145)
Total Bilirubin: 0.5 mg/dL (ref 0.3–1.2)
Total Protein: 6 g/dL — ABNORMAL LOW (ref 6.5–8.1)

## 2019-09-11 LAB — GLUCOSE, CAPILLARY
Glucose-Capillary: 142 mg/dL — ABNORMAL HIGH (ref 70–99)
Glucose-Capillary: 170 mg/dL — ABNORMAL HIGH (ref 70–99)
Glucose-Capillary: 144 mg/dL — ABNORMAL HIGH (ref 70–99)
Glucose-Capillary: 184 mg/dL — ABNORMAL HIGH (ref 70–99)

## 2019-09-11 LAB — FIBRINOGEN: Fibrinogen: 335 mg/dL (ref 210–475)

## 2019-09-11 LAB — PROTIME-INR
INR: 1.1 (ref 0.8–1.2)
Prothrombin Time: 13.8 seconds (ref 11.4–15.2)

## 2019-09-11 LAB — PHOSPHORUS: Phosphorus: 3 mg/dL (ref 2.5–4.6)

## 2019-09-11 LAB — D-DIMER, QUANTITATIVE: D-Dimer, Quant: 1.08 ug/mL-FEU — ABNORMAL HIGH (ref 0.00–0.50)

## 2019-09-11 LAB — MAGNESIUM: Magnesium: 2.2 mg/dL (ref 1.7–2.4)

## 2019-09-11 MED ORDER — POTASSIUM CHLORIDE 20 MEQ/15ML (10%) PO SOLN
30.0000 meq | Freq: Two times a day (BID) | ORAL | Status: AC
  Administered 2019-09-11 (×2): 30 meq
  Filled 2019-09-11 (×2): qty 30

## 2019-09-11 MED ORDER — VITAMIN D 25 MCG (1000 UNIT) PO TABS
2000.0000 [IU] | ORAL_TABLET | Freq: Every day | ORAL | Status: DC
  Administered 2019-09-11 – 2019-09-14 (×4): 2000 [IU]
  Filled 2019-09-11 (×5): qty 2

## 2019-09-11 MED ORDER — ZINC SULFATE 220 (50 ZN) MG PO CAPS
220.0000 mg | ORAL_CAPSULE | Freq: Every day | ORAL | Status: DC
  Administered 2019-09-11 – 2019-09-15 (×5): 220 mg via ORAL
  Filled 2019-09-11 (×5): qty 1

## 2019-09-11 MED ORDER — VITAMIN C 500 MG PO TABS
500.0000 mg | ORAL_TABLET | Freq: Every day | ORAL | Status: DC
  Administered 2019-09-11 – 2019-09-15 (×5): 500 mg via ORAL
  Filled 2019-09-11 (×5): qty 1

## 2019-09-11 NOTE — Progress Notes (Signed)
Patient refusing to let this RN reposition him, patient is moving in bed on his own. Refusing to let this RN assess his sacral wound. Patient is fixated on when he gets his Morphine PO, explained to patient he gets it at 2200, patient states he didn't get his 1800 dose. I explained to him he received it at 1400, and gets it Q 8 hours. Patient is upset, as he thinks he should be getting it Q 6 hours.

## 2019-09-11 NOTE — Plan of Care (Signed)
  Problem: Respiratory: Goal: Will maintain a patent airway Outcome: Progressing Goal: Complications related to the disease process, condition or treatment will be avoided or minimized Outcome: Progressing   Problem: Education: Goal: Knowledge of General Education information will improve Description: Including pain rating scale, medication(s)/side effects and non-pharmacologic comfort measures Outcome: Progressing   Problem: Health Behavior/Discharge Planning: Goal: Ability to manage health-related needs will improve Outcome: Progressing   Problem: Clinical Measurements: Goal: Ability to maintain clinical measurements within normal limits will improve Outcome: Progressing Goal: Will remain free from infection Outcome: Progressing Goal: Diagnostic test results will improve Outcome: Progressing Goal: Respiratory complications will improve Outcome: Progressing Goal: Cardiovascular complication will be avoided Outcome: Progressing   Problem: Activity: Goal: Risk for activity intolerance will decrease Outcome: Progressing   Problem: Nutrition: Goal: Adequate nutrition will be maintained Outcome: Progressing   Problem: Coping: Goal: Level of anxiety will decrease Outcome: Progressing   Problem: Elimination: Goal: Will not experience complications related to bowel motility Outcome: Progressing Goal: Will not experience complications related to urinary retention Outcome: Progressing   Problem: Pain Managment: Goal: General experience of comfort will improve Outcome: Progressing   Problem: Safety: Goal: Ability to remain free from injury will improve Outcome: Progressing

## 2019-09-11 NOTE — Progress Notes (Addendum)
PROGRESS NOTE    Sergio Mitchell  EFE:[id number] DOB: Jun 20, 1951 DOA: 09/02/2019 PCP: Sergio Marble, MD   Brief Narrative:  68 yo Male PMHx TRACH-dependent respiratory failure with PEG tube; chronic systolic CHF; and afib presenting with COVID-19 infection.   The patient is a tragic one who appeared to be in reasonably good health other than fibromyalgia and chronic pain until he was admitted at [facility] on 5/25. He presented with CP and SOB and eventually was diagnosed with cardiogenic shock with EF 20-25%, recommended for AICD. However, he also developed septic shock from HCAP with BAL positive for H flu. He required trach/peg and was discharged to [facility]. He also had encephalopathy with anxiety and was treated by psychiatry. He was admitted to [facility] on 7/15 and has been tolerating some CPAP and T-bar trials. He was treated recently [facility] for HCAP and was sent to [facility] for evaluation; I consulted on him on 9/7 and he was stable for d/c back to [facility] at that time.  Returned to ED with COVID-19 positive labs and worsening hypoxia.  I spoke with his sister and brother-in-law. They had many excellent questions about treatments and prognosis and understand the severity of his condition.  His sister reports that he would prefer to be DNR at this time. Sergio Mitchell who is trach and Vent dependent from [facility] diagnosed with Covid 19 and sent to the er as they don't keep Covid pt. He seems to be at his baseline.   Subjective: 10/19 A/O x4, talking around inflated trach, request to try trach collar again today.   Assessment & Plan:   Principal Problem:   Acute on chronic respiratory failure with hypoxia and hypercapnia (HCC) Active Problems:   Atrial fibrillation (HCC)   Acute respiratory disease due to COVID-19 virus   Respiratory tract infection due to COVID-19 virus   Chronic systolic CHF (congestive heart failure) (HCC)   Pressure injury of skin   AF (paroxysmal atrial  fibrillation) (HCC)   Decubitus ulcer of sacral region, stage 1  Acute on chronic respiratory failure with hypoxia and hypercapnia/Covid pneumonia - VAP 1 month prior to admission treated with cefepime and vancomycin.  Resolved patient was able to come off ventilator. - Worsening hypoxia starting 3 weeks prior to hospitalization.  Placed back on ventilator~2 weeks prior to hospitalization. - CTA; negative PE, LEFT sided atelectasis and inferior lung collapse (chronic) - Complete Remdesivir per pharmacy; stop date 10/14 - Solu-Medrol 55 mg BID stop date 10/19 - 10/11 COVID convalescent plasma x1 dose -10/15 patient with frothy clear discharge from trach receiving Lasix today.  If continued in a.m. consider adding Robinul -10/15 tolerated trach collar for~6 hours -10/19 patient appears much stronger today talking around inflated trach collar, wean patient and attempt to place on trach collar if he tolerates. Marland Kitchen  COVID-19 Labs  Recent Labs    09/09/19 0605 09/10/19 0557  DDIMER 0.94* 0.79*  CRP <0.8 <0.8   Lab Results  Component Value Date   SARSCOV2NAA NOT DETECTED 09/05/2019   [place]off[place] NEGATIVE 83/25/4982   Chronic systolic CHF - Strict in and out -5.7 L -Daily weight Filed Weights   09/09/19 0412 09/10/19 0500 09/11/19 0500  Weight: 106.2 kg 105.6 kg 105.2 kg  -Coreg 12.5 mg BID -Lasix PRN -10/18 Lasix 40 mg x 2 doses  A. fib rate controlled -10/14 currently NSR -See CHF  Acute metabolic encephalopathy -Resolved  Depression/anxiety - Klonopin, 1 mg TID -Lexapro, 10 mg daily -Seroquel 50 mg  BID PRN  Hypokalemia -Potassium goal> 4 -10/19 potassium 30 mEq BID for 2 doses  Hypophosphatemia   Stage I sacral decubitus ulcer - Covered with foam padding - Frequent rolling of patient per ICU protocol   Goals of care - 10/14 social work consult; patient has expressed he does not want to return to [facility] -10/18 discussed case with NCM Sergio Mitchell.  She is  looking into matter. -10/19 discussed case at length with NCM Sergio Mitchell [facility] has declined patient.  Patient would therefore have to agree to trach SNF (which would most likely be [place]of[place]state), or agreed to return to [facility].   DVT prophylaxis: Lovenox Code Status: DNR Family Communication: 10/19  spoke with sister and brother-in-law explained plan of care answered all questions     Disposition Plan: TBD   Consultants:  PCCM    Procedures/Significant Events:     I have personally reviewed and interpreted all radiology studies and my findings are as above.  VENTILATOR SETTINGS: Vent mode; PRVC Set rate; 24 Vt set; 500 FiO2; 40% PEEP; 8   Cultures 10/11 MRSA by PCR positive 10/11 tracheal aspirate for flora 10/13 Novel coronavirus negative    Antimicrobials: Anti-infectives (From admission, onward)   Start     Stop   09/03/19 1000  remdesivir 100 mg in sodium chloride 0.9 % 250 mL IVPB     09/06/19 1044   09/02/19 1000  remdesivir 200 mg in sodium chloride 0.9 % 250 mL IVPB     09/02/19 1115       Devices    LINES / TUBES:      Continuous Infusions: . sodium chloride 10 mL/hr at 09/11/19 0700  . feeding supplement (VITAL 1.5 CAL) Stopped (09/08/19 1200)     Objective: Vitals:   09/11/19 0414 09/11/19 0500 09/11/19 0600 09/11/19 0700  BP:  133/74 (!) 154/94 119/77  Pulse:  (!) 54 64 66  Resp:      Temp:      TempSrc:      SpO2: 96% 96% 97% 94%  Weight:  105.2 kg    Height:        Intake/Output Summary (Last 24 hours) at 09/11/2019 0815 Last data filed at 09/11/2019 0700 Gross per 24 hour  Intake 988.58 ml  Output 1350 ml  Net -361.42 ml   Filed Weights   09/09/19 0412 09/10/19 0500 09/11/19 0500  Weight: 106.2 kg 105.6 kg 105.2 kg   Physical Exam:  General: A/O x4, positive acute respiratory distress Eyes: negative scleral hemorrhage, negative anisocoria, negative icterus ENT: Negative Runny nose, negative gingival bleeding,  #7 cuffed trach in place negative sign of infection Neck:  Negative scars, masses, torticollis, lymphadenopathy, JVD Lungs: Clear to auscultation bilaterally without wheezes or crackles Cardiovascular: Regular rate and rhythm without murmur gallop or rub normal S1 and S2 Abdomen: negative abdominal pain, nondistended, positive soft, bowel sounds, no rebound, no ascites, no appreciable mass, PEG in place negative discharge or sign of infection Extremities: No significant cyanosis, clubbing, or edema bilateral lower extremities Skin: Negative rashes, lesions, ulcers Psychiatric:  Negative depression, negative anxiety, negative fatigue, negative mania  Central nervous system:  Cranial nerves II through XII intact, tongue/uvula midline, all extremities muscle strength 5/5, sensation intact throughout, negative dysarthria, negative expressive aphasia, negative receptive aphasia.     Data Reviewed: Care during the described time interval was provided by me .  I have reviewed this patient's available data, including medical history, events of note, physical examination, and all  test results as part of my evaluation.   CBC: Recent Labs  Lab 09/07/19 0555 09/08/19 0600 09/09/19 0605 09/10/19 0557 09/11/19 0540  WBC 20.7* 19.7* 19.5* 19.1* 18.3*  NEUTROABS 16.4* 16.0* 16.3* 16.1* 15.4*  HGB 11.0* 11.2* 12.0* 11.5* 11.9*  HCT 35.0* 34.9* 36.9* 35.0* 35.9*  MCV 100.0 98.0 95.3 95.4 94.7  PLT 392 402* 440* 424* 409   Basic Metabolic Panel: Recent Labs  Lab 09/07/19 0555 09/08/19 0600 09/09/19 0605 09/10/19 0557 09/11/19 0540  NA 141 137 140 136 133*  K 3.5 3.9 3.7 3.5 3.3*  CL 102 97* 96* 94* 92*  CO2 30 31 32 30 28  GLUCOSE 205* 190* 148* 137* 143*  BUN 54* 51* 55* 63* 60*  CREATININE 0.75 0.76 0.84 0.95 0.96  CALCIUM 9.6 9.4 9.9 9.8 9.7  MG 2.2 2.2 2.4 2.3 2.2  PHOS 1.9* 2.9 4.3 3.9 3.0   GFR: Estimated Creatinine Clearance: 93.9 mL/min (by C-G formula based on SCr of 0.96  mg/dL). Liver Function Tests: Recent Labs  Lab 09/07/19 0555 09/08/19 0600 09/09/19 0605 09/10/19 0557 09/11/19 0540  AST _0 ALT _1 32  ALKPHOS 38 34* 33* 32* 33*  BILITOT 0.5 0.5 0.8 0.3 0.5  PROT 6.2* 5.9* 6.3* 5.9* 6.0*  ALBUMIN 2.9* 2.8* 3.0* 2.9* 3.0*   No results for input(s): LIPASE, AMYLASE in the last 168 hours. No results for input(s): AMMONIA in the last 168 hours. Coagulation Profile: Recent Labs  Lab 09/06/19 0540 09/07/19 0555 09/08/19 0600 09/09/19 0605 09/10/19 0557  INR 1.1 1.1 1.1 1.1 1.1   Cardiac Enzymes: No results for input(s): CKTOTAL, CKMB, CKMBINDEX, TROPONINI in the last 168 hours. BNP (last 3 results) No results for input(s): PROBNP in the last 8760 hours. HbA1C: No results for input(s): HGBA1C in the last 72 hours. CBG: Recent Labs  Lab 09/09/19 2217 09/10/19 0819 09/10/19 1211 09/10/19 1613 09/10/19 2200  GLUCAP 117* 135* 163* 139* 158*   Lipid Profile: No results for input(s): CHOL, HDL, LDLCALC, TRIG, CHOLHDL, LDLDIRECT in the last 72 hours. Thyroid Function Tests: No results for input(s): TSH, T4TOTAL, FREET4, T3FREE, THYROIDAB in the last 72 hours. Anemia Panel: No results for input(s): VITAMINB12, FOLATE, FERRITIN, TIBC, IRON, RETICCTPCT in the last 72 hours. Urine analysis:    Component Value Date/Time   COLORURINE RED (A) 09/02/2019 0301   APPEARANCEUR TURBID (A) 09/02/2019 0301   LABSPEC  09/02/2019 0301    TEST NOT REPORTED DUE TO COLOR INTERFERENCE OF URINE PIGMENT   PHURINE  09/02/2019 0301    TEST NOT REPORTED DUE TO COLOR INTERFERENCE OF URINE PIGMENT   GLUCOSEU NEGATIVE 09/02/2019 0301   HGBUR NEGATIVE 09/02/2019 0301   BILIRUBINUR NEGATIVE 09/02/2019 0301   KETONESUR NEGATIVE 09/02/2019 0301   PROTEINUR NEGATIVE 09/02/2019 0301   UROBILINOGEN 0.2 08/11/2010 1041   NITRITE NEGATIVE 09/02/2019 0301   LEUKOCYTESUR NEGATIVE 09/02/2019 0301   Sepsis Labs:  _2 (procalcitonin:4,lacticidven:4)  ) Recent Results (from the past 240 hour(s))  MRSA PCR Screening     Status: Abnormal   Collection Time: 09/03/19  4:00 AM   Specimen: Nasal Mucosa; Nasopharyngeal  Result Value Ref Range Status   MRSA by PCR POSITIVE (A) NEGATIVE Final    Comment:        The GeneXpert MRSA Assay (FDA approved for NASAL specimens only), is one component of a comprehensive MRSA colonization surveillance program. It is not intended to diagnose MRSA infection nor to guide or  monitor treatment for MRSA infections. RESULT CALLED TO, READ BACK BY AND VERIFIED WITH: JOBE,H RN _0  ON 09/03/2019 JACKSON,K Performed at [facility], [place] [address] [place], [place] [address]   Culture, respiratory (non-expectorated)     Status: None   Collection Time: 09/03/19  9:18 AM   Specimen: Tracheal Aspirate; Respiratory  Result Value Ref Range Status   Specimen Description   Final    TRACHEAL ASPIRATE Performed at [facility] [address] [place], [place] [address]    Special Requests   Final    NONE Performed at [facility], [place] [address], [place], [place] [address]    Gram Stain NO WBC SEEN RARE GRAM POSITIVE COCCI   Final   Culture   Final    Consistent with normal respiratory flora. Performed at [facility], [place] [address] [place], [place] [address]    Report Status 09/07/2019 FINAL  Final  Novel Coronavirus, NAA (Hosp order, Send-out to Ref Lab; TAT 18-24 hrs     Status: None   Collection Time: 09/05/19  1:35 PM   Specimen: Nasopharyngeal Swab; Respiratory  Result Value Ref Range Status   SARS-CoV-2, NAA NOT DETECTED NOT DETECTED Final    Comment: (NOTE) This nucleic acid amplification test was developed and its performance characteristics determined by [employer]. Nucleic acid amplification tests include PCR and TMA. This test has not been FDA cleared or approved. This  test has been authorized by FDA under an Emergency Use Authorization (EUA). This test is only authorized for the duration of time the declaration that circumstances exist justifying the authorization of the emergency use of in vitro diagnostic tests for detection of SARS-CoV-2 virus and/or diagnosis of COVID-19 infection under section 564(b)(1) of the Act, 21 U.S.C. 865HQI-6(N) (1), unless the authorization is terminated or revoked sooner. When diagnostic testing is negative, the possibility of a false negative result should be considered in the context of a patient's recent exposures and the presence of clinical signs and symptoms consistent with COVID-19. An individual without symptoms of COVID- 19 and who is not shedding SARS-CoV-2 vi rus would expect to have a negative (not detected) result in this assay. Performed At: [facility] [place], [place] [id number] Rush Farmer MD KG:[id number]    [place]  Final    Comment: Performed at [facility] [address] [place], [place] [address]         Radiology Studies: No results found.      Scheduled Meds: . sodium chloride   Intravenous Once  . aspirin  81 mg Per Tube Daily  . atorvastatin  20 mg Per Tube Daily  . carvedilol  12.5 mg Per Tube BID WC  . chlorhexidine  15 mL Mouth/Throat BID  . Chlorhexidine Gluconate Cloth  6 each Topical Daily  . clonazePAM  1 mg Per Tube Q8H  . docusate  100 mg Per Tube Daily  . enoxaparin (LOVENOX) injection  40 mg Subcutaneous Q24H  . escitalopram  10 mg Per Tube Daily  . famotidine  20 mg Oral QHS  . feeding supplement (PRO-STAT SUGAR FREE 64)  60 mL Per Tube BID  . furosemide  40 mg Intravenous BID  . insulin aspart  0-5 Units Subcutaneous QHS  . insulin aspart  0-9 Units Subcutaneous TID WC  . ipratropium-albuterol  3 mL Nebulization BID  . methylPREDNISolone (SOLU-MEDROL) injection  55 mg Intravenous Q12H   .  morphine  15 mg Per Tube Q8H  . mupirocin ointment   Nasal BID  . polyethylene glycol  17 g Per Tube Daily  . sodium chloride flush  3 mL Intravenous Q12H  . sodium chloride flush  3 mL Intravenous Q12H   Continuous Infusions: . sodium chloride 10 mL/hr at 09/11/19 0700  . feeding supplement (VITAL 1.5 CAL) Stopped (09/08/19 1200)     LOS: 9 days   The patient is critically ill with multiple organ systems failure and requires high complexity decision making for assessment and support, frequent evaluation and titration of therapies, application of advanced monitoring technologies and extensive interpretation of multiple databases. Critical Care Time devoted to patient care services described in this note  Time spent: 40 minutes     WOODS, Geraldo Docker, MD [facility] Pager [phone]  If 7PM-7AM, please contact night-coverage [email or web address] Password [email or web address] 09/11/2019, 8:15 AM

## 2019-09-11 NOTE — Progress Notes (Signed)
ANTICOAGULATION CONSULT NOTE - Follow Up Consult  Pharmacy Consult for Lovenox Indication: VTE prophylaxis (COVID-PACT trial)  Allergies  Allergen Reactions  . Bacitracin Other (See Comments)    Per MAR  . Cefdinir Other (See Comments)    Per MAR  . Lidocaine Other (See Comments)    Per MAR  . Neomycin Other (See Comments)    Per MAR  . Neosporin Af [Miconazole] Other (See Comments)    Unknown 9.7.2020 Per [facility] MAR pt is currently using Miconazole Topical [place]  . Pantoprazole Other (See Comments)    unknown  . Penicillins Other (See Comments)    Per MAR  . Polymyxin B Other (See Comments)    Per MAR  . Pramoxine Other (See Comments)    Per [facility]    Patient Measurements: Height: 6\' 2"  (188 cm) Weight: 231 lb 14.8 oz (105.2 kg) IBW/kg (Calculated) : 82.2  Vital Signs: Temp: 98 F (36.7 C) (10/19 0900) Temp Source: Oral (10/19 0900) BP: 109/65 (10/19 1142) Pulse Rate: 69 (10/19 1142)  Labs: Recent Labs    09/09/19 0605 09/10/19 0557 09/11/19 0540  HGB 12.0* 11.5* 11.9*  HCT 36.9* 35.0* 35.9*  PLT 440* 424* 396  LABPROT 14.0 14.0 13.8  INR 1.1 1.1 1.1  CREATININE 0.84 0.95 0.96    Estimated Creatinine Clearance: 93.9 mL/min (by C-G formula based on SCr of 0.96 mg/dL).   Medical History: Past Medical History:  Diagnosis Date  . DDD (degenerative disc disease), lumbar   . Fibromyositis   . Osteoporosis   . S/P percutaneous endoscopic gastrostomy (PEG) tube placement (HCC)   . Scoliosis   . Spinal stenosis   . Systolic CHF, chronic (HCC) 04/2019   EF 20-25%, ACID recommended  . Tracheostomy tube present (HCC)   . Ventilator dependence (HCC)     Medications:  Scheduled:  . aspirin  81 mg Per Tube Daily  . atorvastatin  20 mg Per Tube Daily  . carvedilol  12.5 mg Per Tube BID WC  . chlorhexidine  15 mL Mouth/Throat BID  . Chlorhexidine Gluconate Cloth  6 each Topical Daily  . cholecalciferol  2,000 Units Per Tube Daily  . clonazePAM  1 mg Per  Tube Q8H  . docusate  100 mg Per Tube Daily  . enoxaparin (LOVENOX) injection  40 mg Subcutaneous Q24H  . escitalopram  10 mg Per Tube Daily  . famotidine  20 mg Oral QHS  . feeding supplement (PRO-STAT SUGAR FREE 64)  60 mL Per Tube BID  . insulin aspart  0-5 Units Subcutaneous QHS  . insulin aspart  0-9 Units Subcutaneous TID WC  . ipratropium-albuterol  3 mL Nebulization BID  . methylPREDNISolone (SOLU-MEDROL) injection  55 mg Intravenous Q12H  . morphine  15 mg Per Tube Q8H  . mupirocin ointment   Nasal BID  . polyethylene glycol  17 g Per Tube Daily  . potassium chloride  30 mEq Per Tube BID  . sodium chloride flush  3 mL Intravenous Q12H  . sodium chloride flush  3 mL Intravenous Q12H  . vitamin C  500 mg Oral Daily  . zinc sulfate  220 mg Oral Daily   Infusions:  . sodium chloride 10 mL/hr at 09/11/19 0700  . feeding supplement (VITAL 1.5 CAL) Stopped (09/08/19 1200)   PRN: sodium chloride, acetaminophen, albuterol, bisacodyl, fentaNYL (SUBLIMAZE) injection, midazolam, ondansetron **OR** ondansetron (ZOFRAN) IV, oxyCODONE, QUEtiapine, sodium chloride flush, zolpidem  Assessment: 68 yo male admitted from [facility] to [facility] on 09/02/2019  for hypoxemia due to COVID 19 pneumonia. Patient has been enrolled into the COVID-PACT study and assigned to Lovenox VTE prophylaxis arm.   Patient has been receiving enoxaparin 40mg  q24h since trial enrollment which is appropriate based on CrCl >30 and BMI <35. d-Dimer 1.08. Hgb and plt are stable. No overt bleeding noted.    Goal of Therapy:  Prevention of VTE Monitor platelets by anticoagulation protocol: Yes   Plan:  Continue enoxaparin 40mg  SQ q24h per COVID-PACT protocol Monitor CBC and signs/symptoms of bleeding  Richardine Service, PharmD PGY1 Pharmacy Resident 09/11/2019  1:18 PM

## 2019-09-11 NOTE — Care Management (Addendum)
Notified by Dr Sherral Hammers that patient and family DO NOT want to return to [facility] (was at SNF level) due to their perceived lack of effort from [facility] to wean him down to trach collar.  Confirmed with sister Fraser Din that do not want to go back to [facility] and would agreeable to a referral to [facility]. Spoke w Doroteo Bradford at [facility]	[place] (covering for [employer]) and she states that their COVID ruke is that they can accept patient when they are 10 days out from their last +Covid test. Patient admitted 10/10 and there was a negative COVID test on 10/13.  Requested Doroteo Bradford to review chart for possible admission. She states that earliest bed availability would be Thursday. Dr Sherral Hammers notified.   1:50 [facility] cannot make a bed offer due to him being chronic vent/ trach and not having any medicare days. Dr Sherral Hammers updated.   I consulted CSW to discuss trach SNFs. Options are few and they are out of state, with only one other option in [place]. At this time, most are primarily only taking their own patients back and have wait lists. It could take as long as 2 months to get a patient into one of these other facilities. Attempted to call sister Fraser Din back, LVM. Burger,Pat Sister [phone]  [phone]   16:42 spoke with sister, Fraser Din. Explained above and limitations of finding a vent SNF, and she was clear that she wants patient to go to a different facility even if it is out of state. This will need to be confirmed with patient.  I reported this off to [place] and Dr Sherral Hammers.

## 2019-09-11 NOTE — Progress Notes (Signed)
NAME:  Sergio Mitchell, MRN:  [id number], DOB:  1951-07-14, LOS: 13 ADMISSION DATE:  09/02/2019, CONSULTATION DATE:  10/10 REFERRING MD:  Lorin Mercy, CHIEF COMPLAINT:  Hypoxemia   Brief History   69 yo male transferred from [place] on 09/02/19 with worsening hypoxia in setting of COVID pneumonia.  He has chronic trach with intermittent vent dependence since hospitalization in [place] May 2020 in setting of chronic systolic CHF and restrictive lung disease from scoliosis.  Had resided in [place] since 06/07/19.  Past Medical History  Systolic CHF, A fib, Scoliosis, Osteoporosis, Spinal stenosis, Fibromyalgia, Chronic pain, Cardiogenic/Septic shock with PNA May 2020  Significant Hospital Events   10/10 transfer from [facility], convalescent plasma, start decadron and remdesivir 10/11 worsening confusion, hypoxemia; PCCM consulted 10/15 diuresis for acute pulmonary edema 10/18 start pressure support weaning trials  Consults:    Procedures:  Tracheostomy May 2020 >>   Significant Diagnostic Tests:  CT angio chest 10/11 >> calcified LNs, ATX Lt base with volume loss and mediastinal shift to Lt, multifocal GGO in LUL and RUL  Micro Data:  SARS COV2 10/07 >> POSITIVE (from [facility]) Sputum 10/11 >> oral flora SARS COV2 10/13 >> not detected  Antimicrobials/COVID Treatment  Convalescent plasma 10/10 Steroids 10/10 >> Remdesivir 10/10 >> 10/14  Interim history/subjective:  Wants to know when he is getting his pain medication.    Objective   Blood pressure 109/65, pulse 69, temperature 98 F (36.7 C), temperature source Oral, resp. rate (!) 24, height 6\' 2"  (1.88 m), weight 105.2 kg, SpO2 94 %.    Vent Mode: PRVC FiO2 (%):  [40 %-60 %] 40 % Set Rate:  [24 bmp] 24 bmp Vt Set:  [500 mL] 500 mL PEEP:  [8 cmH20-12 cmH20] 8 cmH20 Plateau Pressure:  [19 cmH20-28 cmH20] 22 cmH20   Intake/Output Summary (Last 24 hours) at 09/11/2019 1214 Last data filed at 09/11/2019 0900 Gross per 24 hour   Intake 1254.45 ml  Output 850 ml  Net 404.45 ml   Filed Weights   09/09/19 0412 09/10/19 0500 09/11/19 0500  Weight: 106.2 kg 105.6 kg 105.2 kg    Examination:  General - alert Eyes - pupils reactive ENT - trach site clean Cardiac - regular rate/rhythm, no murmur Chest - decreased BS Lt base, scattered rhonchi, no wheeze Abdomen - soft, non tender, + bowel sounds Extremities - 1+ edema Skin - no rashes Neuro - follows commands appropriately   Resolved Hospital Problem list     Assessment & Plan:   Acute on chronic hypoxic respiratory failure from COVID 19 pneumonia, atelectasis with volume loss LLL and acute pulmonary edema. Restrictive lung disease from scoliosis. Tracheostomy and intermittent vent dependence since hospitalization in May 2020 from cardiogenic/septic shock with pneumonia. - pressure support wean to TC as able - trach care - f/u CXR intermittently - complete 10 day course of steroids on 10/19 - scheduled BDs - negative fluid balance as able - zinc, vit C  Acute on chronic systolic CHF. Hx of HLD, HTN. - continue ASA, lipitor, coreg  Hypokalemia. - replace as needed  Chronic pain, anxiety, depression. - continue klonopin, lexapro, morphine per hospitalist team  Steroid induced hyperglycemia. - SSI  Deconditioning. - PT/OT  Disposition. - pt and family declined option to go back to [facility] - hospitalist team looking into alternative options  Best practice:  Diet: tube feeding, allow to take food by mouth as well DVT prophylaxis: Lovenox low dose per COVID PACT Trial GI prophylaxis: famotidine  Mobility: bed rest Code Status: DNR Disposition: remain in ICU  Labs    CMP Latest Ref Rng & Units 09/11/2019 09/10/2019 09/09/2019  Glucose 70 - 99 mg/dL 035(W) 656(C) 127(N)  BUN 8 - 23 mg/dL 17(G) 01(V) 49(S)  Creatinine 0.61 - 1.24 mg/dL 4.96 7.59 1.63  Sodium 135 - 145 mmol/L 133(L) 136 140  Potassium 3.5 - 5.1 mmol/L 3.3(L) 3.5 3.7   Chloride 98 - 111 mmol/L 92(L) 94(L) 96(L)  CO2 22 - 32 mmol/L 28 30 32  Calcium 8.9 - 10.3 mg/dL 9.7 9.8 9.9  Total Protein 6.5 - 8.1 g/dL 6.0(L) 5.9(L) 6.3(L)  Total Bilirubin 0.3 - 1.2 mg/dL 0.5 0.3 0.8  Alkaline Phos 38 - 126 U/L 33(L) 32(L) 33(L)  AST 15 - 41 U/L 24 27 26   ALT 0 - 44 U/L 32 30 31   CBC Latest Ref Rng & Units 09/11/2019 09/10/2019 09/09/2019  WBC 4.0 - 10.5 K/uL 18.3(H) 19.1(H) 19.5(H)  Hemoglobin 13.0 - 17.0 g/dL 11.9(L) 11.5(L) 12.0(L)  Hematocrit 39.0 - 52.0 % 35.9(L) 35.0(L) 36.9(L)  Platelets 150 - 400 K/uL 396 424(H) 440(H)   ABG    Component Value Date/Time   PHART 7.485 (H) 09/02/2019 0234   PCO2ART 51.2 (H) 09/02/2019 0234   PO2ART 152.0 (H) 09/02/2019 0234   HCO3 38.3 (H) 09/02/2019 0234   TCO2 40 (H) 09/02/2019 0234   O2SAT 99.0 09/02/2019 0234   CBG (last 3)  Recent Labs    09/10/19 1613 09/10/19 2200 09/11/19 0815  GLUCAP 139* 158* 142*    D/w Dr. 09/13/19, MD [facility] Pulmonary/Critical Care 09/11/2019, 12:35 PM

## 2019-09-11 NOTE — Progress Notes (Signed)
Video chat set up for patient and sister. Elink and patient's RN notified.

## 2019-09-11 NOTE — Progress Notes (Signed)
Video call with sister completed. Patient alert and oriented, participating in his own care. RN at bedside.  Provided sister with Verdis Frederickson, significant other's phone number that I found in the chart. Attempted to call Baxter Flattery, the daughter without success.

## 2019-09-12 ENCOUNTER — Encounter (HOSPITAL_COMMUNITY): Payer: 59

## 2019-09-12 ENCOUNTER — Inpatient Hospital Stay (HOSPITAL_COMMUNITY): Payer: Medicare Other

## 2019-09-12 DIAGNOSIS — I480 Paroxysmal atrial fibrillation: Secondary | ICD-10-CM | POA: Diagnosis not present

## 2019-09-12 DIAGNOSIS — U071 COVID-19: Secondary | ICD-10-CM | POA: Diagnosis not present

## 2019-09-12 DIAGNOSIS — J9622 Acute and chronic respiratory failure with hypercapnia: Secondary | ICD-10-CM | POA: Diagnosis not present

## 2019-09-12 DIAGNOSIS — J9621 Acute and chronic respiratory failure with hypoxia: Secondary | ICD-10-CM | POA: Diagnosis not present

## 2019-09-12 DIAGNOSIS — I5022 Chronic systolic (congestive) heart failure: Secondary | ICD-10-CM | POA: Diagnosis not present

## 2019-09-12 LAB — CBC WITH DIFFERENTIAL/PLATELET
Abs Immature Granulocytes: 0.22 10*3/uL — ABNORMAL HIGH (ref 0.00–0.07)
Basophils Absolute: 0 10*3/uL (ref 0.0–0.1)
Basophils Relative: 0 %
Eosinophils Absolute: 0 10*3/uL (ref 0.0–0.5)
Eosinophils Relative: 0 %
HCT: 33.1 % — ABNORMAL LOW (ref 39.0–52.0)
Hemoglobin: 11.2 g/dL — ABNORMAL LOW (ref 13.0–17.0)
Immature Granulocytes: 1 %
Lymphocytes Relative: 5 %
Lymphs Abs: 0.8 10*3/uL (ref 0.7–4.0)
MCH: 31.9 pg (ref 26.0–34.0)
MCHC: 33.8 g/dL (ref 30.0–36.0)
MCV: 94.3 fL (ref 80.0–100.0)
Monocytes Absolute: 1.4 10*3/uL — ABNORMAL HIGH (ref 0.1–1.0)
Monocytes Relative: 9 %
Neutro Abs: 13.2 10*3/uL — ABNORMAL HIGH (ref 1.7–7.7)
Neutrophils Relative %: 85 %
Platelets: 375 10*3/uL (ref 150–400)
RBC: 3.51 MIL/uL — ABNORMAL LOW (ref 4.22–5.81)
RDW: 13.7 % (ref 11.5–15.5)
WBC: 15.7 10*3/uL — ABNORMAL HIGH (ref 4.0–10.5)
nRBC: 0 % (ref 0.0–0.2)

## 2019-09-12 LAB — COMPREHENSIVE METABOLIC PANEL
ALT: 30 U/L (ref 0–44)
AST: 21 U/L (ref 15–41)
Albumin: 2.7 g/dL — ABNORMAL LOW (ref 3.5–5.0)
Alkaline Phosphatase: 31 U/L — ABNORMAL LOW (ref 38–126)
Anion gap: 12 (ref 5–15)
BUN: 49 mg/dL — ABNORMAL HIGH (ref 8–23)
CO2: 28 mmol/L (ref 22–32)
Calcium: 9.4 mg/dL (ref 8.9–10.3)
Chloride: 96 mmol/L — ABNORMAL LOW (ref 98–111)
Creatinine, Ser: 0.84 mg/dL (ref 0.61–1.24)
GFR calc Af Amer: >60 mL/min (ref >60)
GFR calc non Af Amer: >60 mL/min (ref >60)
Glucose, Bld: 144 mg/dL — ABNORMAL HIGH (ref 70–99)
Potassium: 3.7 mmol/L (ref 3.5–5.1)
Sodium: 136 mmol/L (ref 135–145)
Total Bilirubin: 0.3 mg/dL (ref 0.3–1.2)
Total Protein: 5.7 g/dL — ABNORMAL LOW (ref 6.5–8.1)

## 2019-09-12 LAB — GLUCOSE, CAPILLARY
Glucose-Capillary: 133 mg/dL — ABNORMAL HIGH (ref 70–99)
Glucose-Capillary: 154 mg/dL — ABNORMAL HIGH (ref 70–99)
Glucose-Capillary: 160 mg/dL — ABNORMAL HIGH (ref 70–99)

## 2019-09-12 LAB — PHOSPHORUS: Phosphorus: 2.7 mg/dL (ref 2.5–4.6)

## 2019-09-12 LAB — MAGNESIUM: Magnesium: 2.2 mg/dL (ref 1.7–2.4)

## 2019-09-12 LAB — PROTIME-INR
INR: 1.1 (ref 0.8–1.2)
Prothrombin Time: 13.6 seconds (ref 11.4–15.2)

## 2019-09-12 LAB — D-DIMER, QUANTITATIVE: D-Dimer, Quant: 0.85 ug/mL-FEU — ABNORMAL HIGH (ref 0.00–0.50)

## 2019-09-12 LAB — C-REACTIVE PROTEIN: CRP: <0.8 mg/dL (ref <1.0)

## 2019-09-12 MED ORDER — FUROSEMIDE 10 MG/ML IJ SOLN
40.0000 mg | Freq: Two times a day (BID) | INTRAMUSCULAR | Status: AC
  Administered 2019-09-12 – 2019-09-13 (×2): 40 mg via INTRAVENOUS
  Filled 2019-09-12 (×2): qty 4

## 2019-09-12 NOTE — Progress Notes (Signed)
NAME:  Sergio Mitchell, MRN:  [id number], DOB:  [birth year], LOS: 10 ADMISSION DATE:  09/02/2019, CONSULTATION DATE:  10/10 REFERRING MD:  Ophelia Charter, CHIEF COMPLAINT:  Hypoxemia   Brief History   68 yo male transferred from [facility] on 09/02/19 with worsening hypoxia in setting of COVID pneumonia.  He has chronic trach with intermittent vent dependence since hospitalization in [place] May 2020 in setting of chronic systolic CHF and restrictive lung disease from scoliosis.  Had resided in [facility] since 06/07/19.  Past Medical History  Systolic CHF, A fib, Scoliosis, Osteoporosis, Spinal stenosis, Fibromyalgia, Chronic pain, Cardiogenic/Septic shock with PNA May 2020  Significant Hospital Events   10/10 transfer from [facility], convalescent plasma, start decadron and remdesivir 10/11 worsening confusion, hypoxemia; PCCM consulted 10/15 diuresis for acute pulmonary edema 10/18 start pressure support weaning trials  Consults:    Procedures:  Tracheostomy May 2020 >>   Significant Diagnostic Tests:  CT angio chest 10/11 >> calcified LNs, ATX Lt base with volume loss and mediastinal shift to Lt, multifocal GGO in LUL and RUL  Micro Data:  SARS COV2 10/07 >> POSITIVE (from [facility]) Sputum 10/11 >> oral flora SARS COV2 10/13 >> not detected  Antimicrobials/COVID Treatment  Convalescent plasma 10/10 Steroids 10/10 >> 10/20 Remdesivir 10/10 >> 10/14  Interim history/subjective:  Good Vt with Pressure support 12/8.  Maintaining SpO2.  Denies chest or abdominal pain.  Objective   Blood pressure 103/65, pulse 66, temperature 97.6 F (36.4 C), temperature source Oral, resp. rate (!) 21, height 6\' 2"  (1.88 m), weight 107.9 kg, SpO2 93 %.    Vent Mode: PSV;CPAP FiO2 (%):  [40 %] 40 % Set Rate:  [24 bmp] 24 bmp Vt Set:  [500 mL] 500 mL PEEP:  [8 cmH20] 8 cmH20 Pressure Support:  [10 cmH20-12 cmH20] 10 cmH20 Plateau Pressure:  [16 cmH20-28 cmH20] 18 cmH20   Intake/Output Summary (Last 24  hours) at 09/12/2019 0950 Last data filed at 09/12/2019 0900 Gross per 24 hour  Intake 710 ml  Output 1600 ml  Net -890 ml   Filed Weights   09/10/19 0500 09/11/19 0500 09/12/19 0500  Weight: 105.6 kg 105.2 kg 107.9 kg    Examination:  General - alert Eyes - pupils reactive ENT - trach site clean Cardiac - regular rate/rhythm, no murmur Chest - scattered rhonchi Abdomen - soft, non tender, + bowel sounds Extremities - 1+ edema Skin - no rashes Neuro - moves extremities, follows commands Psych - normal mood and behavior  CXR (reviewed by me) - LLL ASD and Rt base ATX with some improvement in Lt base compared to 09/09/19  Resolved Hospital Problem list     Assessment & Plan:   Acute on chronic hypoxic respiratory failure from COVID 19 pneumonia, atelectasis with volume loss LLL and acute pulmonary edema. Restrictive lung disease from scoliosis. Tracheostomy and intermittent vent dependence since hospitalization in May 2020 from cardiogenic/septic shock with pneumonia. - pressure support wean to TC as able - keep PEEP at 8 cm H2O for now - goal SpO2 88 to 95% - trach care - f/u CXR intermittently - scheduled BDs - negative fluid balance as able - continue zinc, Vit C  Acute on chronic systolic CHF. Hx of HLD, HTN. - ASA, coreg, lipitor  Hypokalemia. - replace as needed  Chronic pain, anxiety, depression. - continue klonopin, lexapro, morphine per hospitalist team  Steroid induced hyperglycemia. - SSI  Deconditioning. - PT/OT  Disposition. - pt and family declined option to go back  to [facility] - turned down by [facility] - hospitalist team looking into alternative options  Best practice:  Diet: tube feeding, allow to take food by mouth as well DVT prophylaxis: Lovenox low dose per COVID PACT Trial GI prophylaxis: famotidine  Mobility: bed rest Code Status: DNR Disposition: remain in ICU  Labs    CMP Latest Ref Rng & Units 09/12/2019 09/11/2019  09/10/2019  Glucose 70 - 99 mg/dL 144(H) 143(H) 137(H)  BUN 8 - 23 mg/dL 49(H) 60(H) 63(H)  Creatinine 0.61 - 1.24 mg/dL 0.84 0.96 0.95  Sodium 135 - 145 mmol/L 136 133(L) 136  Potassium 3.5 - 5.1 mmol/L 3.7 3.3(L) 3.5  Chloride 98 - 111 mmol/L 96(L) 92(L) 94(L)  CO2 22 - 32 mmol/L 28 28 30   Calcium 8.9 - 10.3 mg/dL 9.4 9.7 9.8  Total Protein 6.5 - 8.1 g/dL 5.7(L) 6.0(L) 5.9(L)  Total Bilirubin 0.3 - 1.2 mg/dL 0.3 0.5 0.3  Alkaline Phos 38 - 126 U/L 31(L) 33(L) 32(L)  AST 15 - 41 U/L 21 24 27   ALT 0 - 44 U/L 30 32 30   CBC Latest Ref Rng & Units 09/12/2019 09/11/2019 09/10/2019  WBC 4.0 - 10.5 K/uL 15.7(H) 18.3(H) 19.1(H)  Hemoglobin 13.0 - 17.0 g/dL 11.2(L) 11.9(L) 11.5(L)  Hematocrit 39.0 - 52.0 % 33.1(L) 35.9(L) 35.0(L)  Platelets 150 - 400 K/uL 375 396 424(H)   ABG    Component Value Date/Time   PHART 7.485 (H) 09/02/2019 0234   PCO2ART 51.2 (H) 09/02/2019 0234   PO2ART 152.0 (H) 09/02/2019 0234   HCO3 38.3 (H) 09/02/2019 0234   TCO2 40 (H) 09/02/2019 0234   O2SAT 99.0 09/02/2019 0234   CBG (last 3)  Recent Labs    09/11/19 1717 09/11/19 2017 09/12/19 0757  GLUCAP 144* 184* 133*    D/w Dr. Altamese [place], MD [facility] Pulmonary/Critical Care 09/12/2019, 9:50 AM

## 2019-09-12 NOTE — Progress Notes (Signed)
The patient is refusing the bilateral lower extremity venous duplex. He states it was ordered for a scratch that was located on the right, medial ankle, which is no longer there. Furthermore, he states that we just want to perform more tests for more money.  09/12/19 1:36 PM Sergio Mitchell RVT

## 2019-09-12 NOTE — Progress Notes (Signed)
Called pt's sister to update of pt condition and plan of care. Pt's sister appreciative of update 

## 2019-09-12 NOTE — Progress Notes (Addendum)
PROGRESS NOTE    Sergio Mitchell  IEP:[id number] DOB: [birth year] DOA: 09/02/2019 PCP: [name], MD   Brief Narrative:  68 yo Male PMHx TRACH-dependent respiratory failure with PEG tube; chronic systolic CHF; and afib presenting with COVID-19 infection.   The patient is a tragic one who appeared to be in reasonably good health other than fibromyalgia and chronic pain until he was admitted at [facility] on 5/25. He presented with CP and SOB and eventually was diagnosed with cardiogenic shock with EF 20-25%, recommended for AICD. However, he also developed septic shock from HCAP with BAL positive for H flu. He required trach/peg and was discharged to [facility]. He also had encephalopathy with anxiety and was treated by psychiatry. He was admitted to [facility] on 7/15 and has been tolerating some CPAP and T-bar trials. He was treated recently [facility] for HCAP and was sent to [facility] for evaluation; I consulted on him on 9/7 and he was stable for d/c back to [facility] at that time.  Returned to ED with COVID-19 positive labs and worsening hypoxia.  I spoke with his sister and brother-in-law. They had many excellent questions about treatments and prognosis and understand the severity of his condition.  His sister reports that he would prefer to be DNR at this time. Sergio Mitchell who is trach and Vent dependent from [facility] diagnosed with Covid 19 and sent to the er as they dont keep Covid pt. He seems to be at his baseline.   Subjective: 10/20 A/O x4, talking around inflated trach.  Again requests to be placed on trach collar.   Assessment & Plan:   Principal Problem:   Acute on chronic respiratory failure with hypoxia and hypercapnia (HCC) Active Problems:   Atrial fibrillation (HCC)   Acute respiratory disease due to COVID-19 virus   Respiratory tract infection due to COVID-19 virus   Chronic systolic CHF (congestive heart failure) (HCC)   Pressure injury of skin   AF (paroxysmal  atrial fibrillation) (HCC)   Decubitus ulcer of sacral region, stage 1  Acute on chronic respiratory failure with hypoxia and hypercapnia/Covid pneumonia - VAP 1 month prior to admission treated with cefepime and vancomycin.  Resolved patient was able to come off ventilator. - Worsening hypoxia starting 3 weeks prior to hospitalization.  Placed back on ventilator~2 weeks prior to hospitalization. - CTA; negative PE, LEFT sided atelectasis and inferior lung collapse (chronic) - Complete Remdesivir per pharmacy; stop date 10/14 - Solu-Medrol 55 mg BID stop date 10/19 - 10/11 COVID convalescent plasma x1 dose -10/15 patient with frothy clear discharge from trach receiving Lasix today.  If continued in a.m. consider adding Robinul -10/15 tolerated trach collar for~6 hours -10/20 continue to wean patient.  Goal is trach collar.  Again explained to patient that we will be long process to get him on trach collar fully but he is making progress.  COVID-19 Labs  Recent Labs    09/10/19 0557 09/11/19 0540  DDIMER 0.79* 1.08*  CRP <0.8 <0.8   Lab Results  Component Value Date   SARSCOV2NAA NOT DETECTED 09/05/2019   [place] NEGATIVE 66/04/3015   Chronic systolic CHF - Strict in and out -6.0 L -Daily weight Filed Weights   09/10/19 0500 09/11/19 0500 09/12/19 0500  Weight: 105.6 kg 105.2 kg 107.9 kg  -Coreg 12.5 mg BID -Lasix PRN -10/20 Lasix IV 40 mg x 2 doses  A. fib rate controlled -10/14 currently NSR -See CHF  Acute metabolic encephalopathy -Resolved  Depression/anxiety -  Klonopin, 1 mg TID -Lexapro, 10 mg daily -Seroquel 50 mg BID PRN  Hypokalemia -Potassium goal> 4 -10/19 potassium 30 mEq BID for 2 doses  Hypophosphatemia   Stage I sacral decubitus ulcer - Covered with foam padding - Frequent rolling of patient per ICU protocol   Goals of care - 10/14 social work consult; patient has expressed he does not want to return to [facility] -10/18 discussed case  with NCM Elenor Quinones.  She is looking into matter. -10/19 discussed case at length with NCM Debbie [facility] has declined patient.  Patient would therefore have to agree to trach SNF (which would most likely be [place]of[place]state), or agreed to return to [facility]. -10/20 discussed case with NCM Ricki Miller will work on out-of-state vent SNF.  NOTE: patient has negative Covid test from 10/13th    DVT prophylaxis: Lovenox Code Status: DNR Family Communication: 10/20  spoke with sister and brother-in-law explained plan of care answered all questions     Disposition Plan: TBD   Consultants:  PCCM    Procedures/Significant Events:     I have personally reviewed and interpreted all radiology studies and my findings are as above.  VENTILATOR SETTINGS: Vent mode; PRVC Set rate; 24 Vt set; 500 FiO2; 40% PEEP; 8   Cultures 10/11 MRSA by PCR positive 10/11 tracheal aspirate for flora 10/13 Novel coronavirus negative    Antimicrobials: Anti-infectives (From admission, onward)   Start     Stop   09/03/19 1000  remdesivir 100 mg in sodium chloride 0.9 % 250 mL IVPB     09/06/19 1044   09/02/19 1000  remdesivir 200 mg in sodium chloride 0.9 % 250 mL IVPB     09/02/19 1115       Devices    LINES / TUBES:      Continuous Infusions:  sodium chloride 10 mL/hr at 09/11/19 0700   feeding supplement (VITAL 1.5 CAL) Stopped (09/08/19 1200)     Objective: Vitals:   09/12/19 0400 09/12/19 0425 09/12/19 0500 09/12/19 0600  BP: (!) 157/78 (!) 157/78 138/67 (!) 143/81  Pulse: (!) 53 (!) 56 60 67  Resp: (!) 22 (!) 24 (!) 21 (!) 23  Temp: 98 F (36.7 C)     TempSrc: Oral     SpO2:  96%    Weight:   107.9 kg   Height:        Intake/Output Summary (Last 24 hours) at 09/12/2019 0744 Last data filed at 09/12/2019 0600 Gross per 24 hour  Intake 725.87 ml  Output 2350 ml  Net -1624.13 ml   Filed Weights   09/10/19 0500 09/11/19 0500 09/12/19 0500  Weight: 105.6 kg  105.2 kg 107.9 kg   Physical Exam:  General: A/O x4, positive acute respiratory distress Eyes: negative scleral hemorrhage, negative anisocoria, negative icterus ENT: Negative Runny nose, negative gingival bleeding, #7 cuffed trach in place negative sign of infection Neck:  Negative scars, masses, torticollis, lymphadenopathy, JVD Lungs: Tachypneic clear to auscultation bilaterally without wheezes or crackles Cardiovascular: Bradycardic without murmur gallop or rub normal S1 and S2 Abdomen: negative abdominal pain, nondistended, positive soft, bowel sounds, no rebound, no ascites, no appreciable mass Extremities: No significant cyanosis, clubbing, or edema bilateral lower extremities Skin: Negative rashes, lesions, ulcers Psychiatric:  Negative depression, negative anxiety, negative fatigue, negative mania  Central nervous system:  Cranial nerves II through XII intact, tongue/uvula midline, all extremities muscle strength 5/5, sensation intact throughout,  negative dysarthria, negative expressive aphasia, negative receptive aphasia.  Data Reviewed: Care during the described time interval was provided by me .  I have reviewed this patient's available data, including medical history, events of note, physical examination, and all test results as part of my evaluation.   CBC: Recent Labs  Lab 09/07/19 0555 09/08/19 0600 09/09/19 0605 09/10/19 0557 09/11/19 0540  WBC 20.7* 19.7* 19.5* 19.1* 18.3*  NEUTROABS 16.4* 16.0* 16.3* 16.1* 15.4*  HGB 11.0* 11.2* 12.0* 11.5* 11.9*  HCT 35.0* 34.9* 36.9* 35.0* 35.9*  MCV 100.0 98.0 95.3 95.4 94.7  PLT 392 402* 440* 424* 435   Basic Metabolic Panel: Recent Labs  Lab 09/07/19 0555 09/08/19 0600 09/09/19 0605 09/10/19 0557 09/11/19 0540  NA 141 137 140 136 133*  K 3.5 3.9 3.7 3.5 3.3*  CL 102 97* 96* 94* 92*  CO2 30 31 32 30 28  GLUCOSE 205* 190* 148* 137* 143*  BUN 54* 51* 55* 63* 60*  CREATININE 0.75 0.76 0.84 0.95 0.96  CALCIUM  9.6 9.4 9.9 9.8 9.7  MG 2.2 2.2 2.4 2.3 2.2  PHOS 1.9* 2.9 4.3 3.9 3.0   GFR: Estimated Creatinine Clearance: 95 mL/min (by C-G formula based on SCr of 0.96 mg/dL). Liver Function Tests: Recent Labs  Lab 09/07/19 0555 09/08/19 0600 09/09/19 0605 09/10/19 0557 09/11/19 0540  AST _0 ALT _1 32  ALKPHOS 38 34* 33* 32* 33*  BILITOT 0.5 0.5 0.8 0.3 0.5  PROT 6.2* 5.9* 6.3* 5.9* 6.0*  ALBUMIN 2.9* 2.8* 3.0* 2.9* 3.0*   No results for input(s): LIPASE, AMYLASE in the last 168 hours. No results for input(s): AMMONIA in the last 168 hours. Coagulation Profile: Recent Labs  Lab 09/08/19 0600 09/09/19 0605 09/10/19 0557 09/11/19 0540 09/12/19 0520  INR 1.1 1.1 1.1 1.1 1.1   Cardiac Enzymes: No results for input(s): CKTOTAL, CKMB, CKMBINDEX, TROPONINI in the last 168 hours. BNP (last 3 results) No results for input(s): PROBNP in the last 8760 hours. HbA1C: No results for input(s): HGBA1C in the last 72 hours. CBG: Recent Labs  Lab 09/10/19 2200 09/11/19 0815 09/11/19 1233 09/11/19 1717 09/11/19 2017  GLUCAP 158* 142* 170* 144* 184*   Lipid Profile: No results for input(s): CHOL, HDL, LDLCALC, TRIG, CHOLHDL, LDLDIRECT in the last 72 hours. Thyroid Function Tests: No results for input(s): TSH, T4TOTAL, FREET4, T3FREE, THYROIDAB in the last 72 hours. Anemia Panel: No results for input(s): VITAMINB12, FOLATE, FERRITIN, TIBC, IRON, RETICCTPCT in the last 72 hours. Urine analysis:    Component Value Date/Time   COLORURINE RED (A) 09/02/2019 0301   APPEARANCEUR TURBID (A) 09/02/2019 0301   LABSPEC  09/02/2019 0301    TEST NOT REPORTED DUE TO COLOR INTERFERENCE OF URINE PIGMENT   PHURINE  09/02/2019 0301    TEST NOT REPORTED DUE TO COLOR INTERFERENCE OF URINE PIGMENT   GLUCOSEU NEGATIVE 09/02/2019 0301   HGBUR NEGATIVE 09/02/2019 0301   BILIRUBINUR NEGATIVE 09/02/2019 0301   KETONESUR NEGATIVE 09/02/2019 0301   PROTEINUR NEGATIVE 09/02/2019 0301    UROBILINOGEN 0.2 08/11/2010 1041   NITRITE NEGATIVE 09/02/2019 0301   LEUKOCYTESUR NEGATIVE 09/02/2019 0301   Sepsis Labs: _2 (procalcitonin:4,lacticidven:4)  ) Recent Results (from the past 240 hour(s))  MRSA PCR Screening     Status: Abnormal   Collection Time: 09/03/19  4:00 AM   Specimen: Nasal Mucosa; Nasopharyngeal  Result Value Ref Range Status   MRSA by PCR POSITIVE (A) NEGATIVE Final    Comment:        The  GeneXpert MRSA Assay (FDA approved for NASAL specimens only), is one component of a comprehensive MRSA colonization surveillance program. It is not intended to diagnose MRSA infection nor to guide or monitor treatment for MRSA infections. RESULT CALLED TO, READ BACK BY AND VERIFIED WITH: JOBE,H RN _0  ON 09/03/2019 JACKSON,K Performed at [facility], [place] [address], [place], [place] [address]   Culture, respiratory (non-expectorated)     Status: None   Collection Time: 09/03/19  9:18 AM   Specimen: Tracheal Aspirate; Respiratory  Result Value Ref Range Status   Specimen Description   Final    TRACHEAL ASPIRATE Performed at [place] [address] [place], [place] [address]    Special Requests   Final    NONE Performed at [facility], [place] [address] [place], [place] [address]    Gram Stain NO WBC SEEN RARE GRAM POSITIVE COCCI   Final   Culture   Final    Consistent with normal respiratory flora. Performed at [facility], [place] [address], [place], [place] [address]    Report Status 09/07/2019 FINAL  Final  Novel Coronavirus, NAA (Hosp order, Send-out to Ref Lab; TAT 18-24 hrs     Status: None   Collection Time: 09/05/19  1:35 PM   Specimen: Nasopharyngeal Swab; Respiratory  Result Value Ref Range Status   SARS-CoV-2, NAA NOT DETECTED NOT DETECTED Final    Comment: (NOTE) This nucleic acid amplification test was developed and its performance characteristics determined by  [employer]. Nucleic acid amplification tests include PCR and TMA. This test has not been FDA cleared or approved. This test has been authorized by FDA under an Emergency Use Authorization (EUA). This test is only authorized for the duration of time the declaration that circumstances exist justifying the authorization of the emergency use of in vitro diagnostic tests for detection of SARS-CoV-2 virus and/or diagnosis of COVID-19 infection under section 564(b)(1) of the Act, 21 U.S.C. 532DJM-4(Q) (1), unless the authorization is terminated or revoked sooner. When diagnostic testing is negative, the possibility of a false negative result should be considered in the context of a patient's recent exposures and the presence of clinical signs and symptoms consistent with COVID-19. An individual without symptoms of COVID- 19 and who is not shedding SARS-CoV-2 vi rus would expect to have a negative (not detected) result in this assay. Performed At: [facility], [place] [id number] Rush Farmer MD WL:[id number]    [place]  Final    Comment: Performed at [place] [address], [place],  [place] [address]         Radiology Studies: Dg Chest Port 1 View  Result Date: 09/12/2019 CLINICAL DATA:  Respiratory failure. EXAM: PORTABLE CHEST 1 VIEW COMPARISON:  09/09/2019. FINDINGS: Tracheostomy tube in stable position. Heart size stable. Persistent left base atelectasis/infiltrate. Progressive right base atelectasis. Small left pleural effusion. No pneumothorax. Degenerative change thoracic spine and both shoulders. IMPRESSION: 1.  Tracheostomy tube in stable position. 2. Persistent left base atelectasis/infiltrate and small left pleural effusion. Progressive right base atelectatic changes. Electronically Signed   By: Marcello Moores  Register   On: 09/12/2019 07:22        Scheduled Meds:  aspirin  81 mg Per  Tube Daily   atorvastatin  20 mg Per Tube Daily   carvedilol  12.5 mg Per Tube BID WC   chlorhexidine  15 mL Mouth/Throat BID   Chlorhexidine Gluconate Cloth  6 each Topical Daily   cholecalciferol  2,000 Units Per Tube Daily   clonazePAM  1 mg Per Tube Q8H   docusate  100 mg Per Tube Daily   enoxaparin (LOVENOX) injection  40 mg Subcutaneous Q24H   escitalopram  10 mg Per Tube Daily   famotidine  20 mg Oral QHS   feeding supplement (PRO-STAT SUGAR FREE 64)  60 mL Per Tube BID   insulin aspart  0-5 Units Subcutaneous QHS   insulin aspart  0-9 Units Subcutaneous TID WC   ipratropium-albuterol  3 mL Nebulization BID   methylPREDNISolone (SOLU-MEDROL) injection  55 mg Intravenous Q12H   morphine  15 mg Per Tube Q8H   mupirocin ointment   Nasal BID   polyethylene glycol  17 g Per Tube Daily   sodium chloride flush  3 mL Intravenous Q12H   sodium chloride flush  3 mL Intravenous Q12H   vitamin C  500 mg Oral Daily   zinc sulfate  220 mg Oral Daily   Continuous Infusions:  sodium chloride 10 mL/hr at 09/11/19 0700   feeding supplement (VITAL 1.5 CAL) Stopped (09/08/19 1200)     LOS: 10 days   The patient is critically ill with multiple organ systems failure and requires high complexity decision making for assessment and support, frequent evaluation and titration of therapies, application of advanced monitoring technologies and extensive interpretation of multiple databases. Critical Care Time devoted to patient care services described in this note  Time spent: 40 minutes     Nadalee Neiswender, Geraldo Docker, MD [facility] Pager [phone]  If 7PM-7AM, please contact night-coverage [email or web address] Password [facility] 09/12/2019, 7:44 AM

## 2019-09-12 NOTE — Plan of Care (Signed)
  Problem: Respiratory: Goal: Will maintain a patent airway Outcome: Progressing Goal: Complications related to the disease process, condition or treatment will be avoided or minimized Outcome: Progressing   Problem: Clinical Measurements: Goal: Ability to maintain clinical measurements within normal limits will improve Outcome: Progressing Goal: Will remain free from infection Outcome: Progressing Goal: Respiratory complications will improve Outcome: Progressing Goal: Cardiovascular complication will be avoided Outcome: Progressing   Problem: Activity: Goal: Risk for activity intolerance will decrease Outcome: Progressing   Problem: Nutrition: Goal: Adequate nutrition will be maintained Outcome: Progressing   Problem: Pain Managment: Goal: General experience of comfort will improve Outcome: Progressing   Problem: Safety: Goal: Ability to remain free from injury will improve Outcome: Progressing

## 2019-09-12 NOTE — Care Management Important Message (Signed)
Important Message  Patient Details  Name: Sergio Mitchell MRN: [id number] Date of Birth: 1950/12/31   Medicare Important Message Given:  Yes - Important Message mailed due to current National Emergency   Verbal consent obtained due to current National Emergency  Relationship to patient: Family Member Contact Name: Alric Quan Call Date: 09/12/19  Time: 1412 Phone: [phone] Outcome: Spoke with contact Important Message mailed to: Other (must enter comment)([address] [facility] [place] [address])      Orbie Pyo 09/12/2019, 2:15 PM

## 2019-09-13 DIAGNOSIS — U071 COVID-19: Secondary | ICD-10-CM | POA: Diagnosis not present

## 2019-09-13 DIAGNOSIS — J9622 Acute and chronic respiratory failure with hypercapnia: Secondary | ICD-10-CM | POA: Diagnosis not present

## 2019-09-13 DIAGNOSIS — J9621 Acute and chronic respiratory failure with hypoxia: Secondary | ICD-10-CM | POA: Diagnosis not present

## 2019-09-13 DIAGNOSIS — J069 Acute upper respiratory infection, unspecified: Secondary | ICD-10-CM | POA: Diagnosis not present

## 2019-09-13 LAB — CBC WITH DIFFERENTIAL/PLATELET
Abs Immature Granulocytes: 0.16 10*3/uL — ABNORMAL HIGH (ref 0.00–0.07)
Basophils Absolute: 0 10*3/uL (ref 0.0–0.1)
Basophils Relative: 0 %
Eosinophils Absolute: 0 10*3/uL (ref 0.0–0.5)
Eosinophils Relative: 0 %
HCT: 33.5 % — ABNORMAL LOW (ref 39.0–52.0)
Hemoglobin: 11.1 g/dL — ABNORMAL LOW (ref 13.0–17.0)
Immature Granulocytes: 1 %
Lymphocytes Relative: 8 %
Lymphs Abs: 1.3 10*3/uL (ref 0.7–4.0)
MCH: 31.6 pg (ref 26.0–34.0)
MCHC: 33.1 g/dL (ref 30.0–36.0)
MCV: 95.4 fL (ref 80.0–100.0)
Monocytes Absolute: 1.5 10*3/uL — ABNORMAL HIGH (ref 0.1–1.0)
Monocytes Relative: 9 %
Neutro Abs: 13.6 10*3/uL — ABNORMAL HIGH (ref 1.7–7.7)
Neutrophils Relative %: 82 %
Platelets: 356 10*3/uL (ref 150–400)
RBC: 3.51 MIL/uL — ABNORMAL LOW (ref 4.22–5.81)
RDW: 13.7 % (ref 11.5–15.5)
WBC: 16.5 10*3/uL — ABNORMAL HIGH (ref 4.0–10.5)
nRBC: 0 % (ref 0.0–0.2)

## 2019-09-13 LAB — COMPREHENSIVE METABOLIC PANEL
ALT: 33 U/L (ref 0–44)
AST: 21 U/L (ref 15–41)
Albumin: 2.6 g/dL — ABNORMAL LOW (ref 3.5–5.0)
Alkaline Phosphatase: 29 U/L — ABNORMAL LOW (ref 38–126)
Anion gap: 10 (ref 5–15)
BUN: 48 mg/dL — ABNORMAL HIGH (ref 8–23)
CO2: 32 mmol/L (ref 22–32)
Calcium: 9.2 mg/dL (ref 8.9–10.3)
Chloride: 93 mmol/L — ABNORMAL LOW (ref 98–111)
Creatinine, Ser: 0.79 mg/dL (ref 0.61–1.24)
GFR calc Af Amer: >60 mL/min (ref >60)
GFR calc non Af Amer: >60 mL/min (ref >60)
Glucose, Bld: 112 mg/dL — ABNORMAL HIGH (ref 70–99)
Potassium: 3.2 mmol/L — ABNORMAL LOW (ref 3.5–5.1)
Sodium: 135 mmol/L (ref 135–145)
Total Bilirubin: 0.5 mg/dL (ref 0.3–1.2)
Total Protein: 5.4 g/dL — ABNORMAL LOW (ref 6.5–8.1)

## 2019-09-13 LAB — PROTIME-INR
INR: 1.1 (ref 0.8–1.2)
Prothrombin Time: 14 seconds (ref 11.4–15.2)

## 2019-09-13 LAB — GLUCOSE, CAPILLARY
Glucose-Capillary: 110 mg/dL — ABNORMAL HIGH (ref 70–99)
Glucose-Capillary: 82 mg/dL (ref 70–99)
Glucose-Capillary: 116 mg/dL — ABNORMAL HIGH (ref 70–99)
Glucose-Capillary: 109 mg/dL — ABNORMAL HIGH (ref 70–99)
Glucose-Capillary: 92 mg/dL (ref 70–99)

## 2019-09-13 LAB — MAGNESIUM: Magnesium: 2 mg/dL (ref 1.7–2.4)

## 2019-09-13 LAB — PHOSPHORUS: Phosphorus: 2 mg/dL — ABNORMAL LOW (ref 2.5–4.6)

## 2019-09-13 MED ORDER — POTASSIUM & SODIUM PHOSPHATES 280-160-250 MG PO PACK
2.0000 | PACK | Freq: Three times a day (TID) | ORAL | Status: AC
  Administered 2019-09-13 (×4): 2 via ORAL
  Filled 2019-09-13 (×4): qty 2

## 2019-09-13 MED ORDER — FUROSEMIDE 10 MG/ML IJ SOLN
40.0000 mg | Freq: Every day | INTRAMUSCULAR | Status: DC
  Administered 2019-09-14: 40 mg via INTRAVENOUS
  Filled 2019-09-13 (×2): qty 4

## 2019-09-13 MED ORDER — IPRATROPIUM-ALBUTEROL 0.5-2.5 (3) MG/3ML IN SOLN
3.0000 mL | RESPIRATORY_TRACT | Status: DC | PRN
  Filled 2019-09-13: qty 3

## 2019-09-13 MED ORDER — OXYCODONE HCL 5 MG PO TABS: 5.0000 mg | ORAL_TABLET | ORAL | Status: DC | PRN

## 2019-09-13 MED ORDER — POTASSIUM CHLORIDE 20 MEQ PO PACK
20.0000 meq | PACK | Freq: Two times a day (BID) | ORAL | Status: AC
  Administered 2019-09-13 (×2): 20 meq
  Filled 2019-09-13 (×2): qty 1

## 2019-09-13 MED ORDER — FAMOTIDINE 20 MG PO TABS
20.0000 mg | ORAL_TABLET | Freq: Every day | ORAL | Status: DC
  Administered 2019-09-13 – 2019-09-14 (×2): 20 mg
  Filled 2019-09-13 (×2): qty 1

## 2019-09-13 NOTE — Progress Notes (Signed)
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  He was originally admitted to the [place] in [place] on May 25 after he presented with chest pain and shortness of breath.  He was found to be in cardiogenic shock with an EF of 20% and then developed septic shock related to Haemophilus influenza pneumonia.  Following his prolonged acute hospitalization he was transferred to [place] on July 15.  He was transferred from [facility] to the [facility] ED 10/10 with persistent hypoxia in the setting of COVID-19.  He had a positive Covid test 10/7 while at [facility].  Significant Events: 10/10 admit to [facility] ICU from [place] via [facility] ED  10/13 repeat testing negative  COVID-19 specific Treatment: Solu-Medrol 10/10 > 10/20 Remdesivir 10/10 > 10/14 Convalescent plasma 10/10  Subjective: Despite being on the vent the patient is alert and communicative.  He is in good spirits.  He reports some ongoing shortness of breath.  He denies chest pain nausea vomiting or abdominal pain.  Assessment & Plan:  Acute on chronic hypoxic and hypercapnic respiratory failure -tracheostomy dependent - COVID pneumonia Tracheostomy placed May 2020 - ventilator management per PCCM  Chronic restrictive lung 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  Chronic systolic CHF Chronically on Lasix 40 mg daily -net -1200 cc last 24 hours and net -9500 cc since admit Filed Weights   09/11/19 0500 09/12/19 0500 09/13/19 0416  Weight: 105.2 kg 107.9 kg 106.7 kg    Atrial fibrillation NSR presently -does not appear to be on chronic anticoagulation -is chronically on 81 mg aspirin and Coreg  Hypokalemia Supplement to goal of 4.0  Hypophosphatemia Supplement to goal of approximately 3  Stage I sacral decubitus  ulcer  Toxic metabolic encephalopathy Appears to be much improved at this time  Depression/anxiety  DVT prophylaxis: COVID-PACT Code Status: NO CODE - DNR  Family Communication:  Disposition Plan: Has refused return to [facility]  Consultants:  none  Antimicrobials:  None  Objective: Blood pressure 115/77, pulse (!) 58, temperature 98.4 F (36.9 C), temperature source Oral, resp. rate (!) 24, height 6\' 2"  (1.88 m), weight 106.7 kg, SpO2 92 %.  Intake/Output Summary (Last 24 hours) at 09/13/2019 0723 Last data filed at 09/13/2019 0600 Gross per 24 hour  Intake 1178.21 ml  Output 3100 ml  Net -1921.79 ml   Filed Weights   09/11/19 0500 09/12/19 0500 09/13/19 0416  Weight: 105.2 kg 107.9 kg 106.7 kg    Examination: General: No acute respiratory distress Lungs: Fine crackles diffusely Cardiovascular: Regular rate and rhythm without murmur gallop or rub normal S1 and S2 Abdomen: Nontender, nondistended, soft, bowel sounds positive, no rebound, no ascites, no appreciable mass Extremities: Trace bilateral lower extremity edema  CBC: Recent Labs  Lab 09/11/19 0540 09/12/19 0520 09/13/19 0535  WBC 18.3* 15.7* 16.5*  NEUTROABS 15.4* 13.2* 13.6*  HGB 11.9* 11.2* 11.1*  HCT 35.9* 33.1* 33.5*  MCV 94.7 94.3 95.4  PLT 396 375 761   Basic Metabolic Panel: Recent Labs  Lab 09/10/19 0557 09/11/19 0540 09/12/19 0520  NA 136 133* 136  K 3.5 3.3* 3.7  CL 94* 92* 96*  CO2 30 28 28   GLUCOSE 137* 143* 144*  BUN 63* 60* 49*  CREATININE 0.95 0.96 0.84  CALCIUM 9.8 9.7 9.4  MG 2.3 2.2 2.2  PHOS  3.9 3.0 2.7   GFR: Estimated Creatinine Clearance: 108 mL/min (by C-G formula based on SCr of 0.84 mg/dL).  Liver Function Tests: Recent Labs  Lab 09/09/19 0605 09/10/19 0557 09/11/19 0540 09/12/19 0520  AST 26 27 24 21   ALT 31 30 32 30  ALKPHOS 33* 32* 33* 31*  BILITOT 0.8 0.3 0.5 0.3  PROT 6.3* 5.9* 6.0* 5.7*  ALBUMIN 3.0* 2.9* 3.0* 2.7*    Coagulation Profile:  Recent Labs  Lab 09/08/19 0600 09/09/19 0605 09/10/19 0557 09/11/19 0540 09/12/19 0520  INR 1.1 1.1 1.1 1.1 1.1    CBG: Recent Labs  Lab 09/11/19 2017 09/12/19 0757 09/12/19 1244 09/12/19 1526 09/13/19 0159  GLUCAP 184* 133* 154* 160* 110*    Recent Results (from the past 240 hour(s))  Culture, respiratory (non-expectorated)     Status: None   Collection Time: 09/03/19  9:18 AM   Specimen: Tracheal Aspirate; Respiratory  Result Value Ref Range Status   Specimen Description   Final    TRACHEAL ASPIRATE Performed at [facility], [address] [place], [place] [address]    Special Requests   Final    NONE Performed at [facility], [address] [place], [place] [address]    Gram Stain NO WBC SEEN RARE GRAM POSITIVE COCCI   Final   Culture   Final    Consistent with normal respiratory flora. Performed at [facility], [address] [place], [place] [address]    Report Status 09/07/2019 FINAL  Final  Novel Coronavirus, NAA (Hosp order, Send-out to Ref Lab; TAT 18-24 hrs     Status: None   Collection Time: 09/05/19  1:35 PM   Specimen: Nasopharyngeal Swab; Respiratory  Result Value Ref Range Status   SARS-CoV-2, NAA NOT DETECTED NOT DETECTED Final    Comment: (NOTE) This nucleic acid amplification test was developed and its performance characteristics determined by [facility]. Nucleic acid amplification tests include PCR and TMA. This test has not been FDA cleared or approved. This test has been authorized by FDA under an Emergency Use Authorization (EUA). This test is only authorized for the duration of time the declaration that circumstances exist justifying the authorization of the emergency use of in vitro diagnostic tests for detection of SARS-CoV-2 virus and/or diagnosis of COVID-19 infection under section 564(b)(1) of the Act, 21 U.S.C. 914NWG-9(F360bbb-3(b) (1), unless the authorization is terminated or  revoked sooner. When diagnostic testing is negative, the possibility of a false negative result should be considered in the context of a patient's recent exposures and the presence of clinical signs and symptoms consistent with COVID-19. An individual without symptoms of COVID- 19 and who is not shedding SARS-CoV-2 vi rus would expect to have a negative (not detected) result in this assay. Performed At: [facility] [address] [place], [place] [id number] Jolene SchimkeNagendra Sanjai MD QI:[id number]:[phone]    Coronavirus Source NASOPHARYNGEAL  Final    Comment: Performed at [facility], [address] [place], [place] [address]     Scheduled Meds: . aspirin  81 mg Per Tube Daily  . atorvastatin  20 mg Per Tube Daily  . carvedilol  12.5 mg Per Tube BID WC  . chlorhexidine  15 mL Mouth/Throat BID  . Chlorhexidine Gluconate Cloth  6 each Topical Daily  . cholecalciferol  2,000 Units Per Tube Daily  . clonazePAM  1 mg Per Tube Q8H  . docusate  100 mg Per Tube Daily  .  enoxaparin (LOVENOX) injection  40 mg Subcutaneous Q24H  . escitalopram  10 mg Per Tube Daily  . famotidine  20 mg Oral QHS  . feeding supplement (PRO-STAT SUGAR FREE 64)  60 mL Per Tube BID  . furosemide  40 mg Intravenous BID  . insulin aspart  0-5 Units Subcutaneous QHS  . insulin aspart  0-9 Units Subcutaneous TID WC  . ipratropium-albuterol  3 mL Nebulization BID  . morphine  15 mg Per Tube Q8H  . mupirocin ointment   Nasal BID  . polyethylene glycol  17 g Per Tube Daily  . sodium chloride flush  3 mL Intravenous Q12H  . sodium chloride flush  3 mL Intravenous Q12H  . vitamin C  500 mg Oral Daily  . zinc sulfate  220 mg Oral Daily   Continuous Infusions: . sodium chloride 10 mL/hr at 09/13/19 0600  . feeding supplement (VITAL 1.5 CAL) Stopped (09/08/19 1200)     LOS: 11 days   Lonia Blood, MD [facility] Office  [phone] Pager - Text Page per Amion  If 7PM-7AM, please  contact night-coverage per Amion 09/13/2019, 7:23 AM

## 2019-09-13 NOTE — Progress Notes (Signed)
NAME:  Sergio Mitchell, MRN:  [id number], DOB:  17-Aug-1951, LOS: 11 ADMISSION DATE:  09/02/2019, CONSULTATION DATE:  10/10 REFERRING MD:  Ophelia Charter, CHIEF COMPLAINT:  Dyspnea   Brief History   68 yo male transferred from [facility] on 09/02/19 with worsening hypoxia in setting of COVID pneumonia.  He has chronic trach with intermittent vent dependence since hospitalization in [place] May 2020 in setting of chronic systolic CHF and restrictive lung disease from scoliosis.  Had resided in [facility] since 06/07/19.   Past Medical History  Systolic heart failure A. fib Scoliosis Osteoporosis Spinal stenosis Fibromyalgia Chronic pain Chronic respiratory failure with hypoxemia requiring tracheostomy and persistent ventilator use after May 2020  Significant Hospital Events   10/10 transfer from [facility], convalescent plasma, start decadron and remdesivir 10/11 worsening confusion, hypoxemia; 10/15 diuresis for acute pulmonary edema 10/18 start pressure support weaning trials  Consults:  PCCM  Procedures:  Tracheostomy 5/20 >   Significant Diagnostic Tests:  CT angio chest 10/11 >> calcified LNs, ATX Lt base with volume loss and mediastinal shift to Lt, multifocal GGO in LUL and RUL  Micro Data:  SARS COV2 10/07 >> POSITIVE (from [facility]) Sputum 10/11 >> oral flora SARS COV2 10/13 >> not detected   Antimicrobials/COVID Rx:   Convalescent plasma 10/10 Steroids 10/10 >> 10/20 Remdesivir 10/10 >> 10/14   Interim history/subjective:  Weaning on vent some  Objective   Blood pressure (!) 140/92, pulse 64, temperature 98.3 F (36.8 C), temperature source Oral, resp. rate (!) 25, height 6\' 2"  (1.88 m), weight 106.7 kg, SpO2 96 %.    Vent Mode: PRVC FiO2 (%):  [40 %] 40 % Set Rate:  [24 bmp] 24 bmp Vt Set:  [500 mL] 500 mL PEEP:  [8 cmH20] 8 cmH20 Pressure Support:  [8 cmH20] 8 cmH20 Plateau Pressure:  [17 cmH20-28 cmH20] 24 cmH20   Intake/Output Summary (Last 24 hours) at 09/13/2019  0844 Last data filed at 09/13/2019 0800 Gross per 24 hour  Intake 1184.87 ml  Output 3100 ml  Net -1915.13 ml   Filed Weights   09/11/19 0500 09/12/19 0500 09/13/19 0416  Weight: 105.2 kg 107.9 kg 106.7 kg    Examination:  General:  In bed on vent HENT: NCAT tracheostomy tube in place PULM: Few crackles bases B, vent supported breathing CV: RRR, no mgr GI: BS+, soft, nontender MSK: normal bulk and tone Neuro: awake, following commands  10/20 CXR images personally reviewed:   Resolved Hospital Problem list     Assessment & Plan:  Acute on chronic respiratory failure from COVID-19 pneumonia, atelectasis, volume loss left lower lobe and acute pulmonary edema Restrictive lung disease from scoliosis Tracheostomy and intermittent ventilator dependent since May 2020 Continue to wean vent today: pressure support as tolerated tracheostomy care per routine Resume full vent support at night Pulmonary toilette Needs placement Duoneb> change to q4h prn  Acute on chronic systolic congestive heart failure Lasix prn Monitor I/O   Best practice:  Diet: tube feeding and po Pain/Anxiety/Delirium protocol (if indicated): n/a VAP protocol (if indicated): n/a DVT prophylaxis: lovenox per COVID PACT trial GI prophylaxis: famotidine Glucose control: SSI Mobility: PT/OT range of motion work Code Status: DNR Family Communication: I updated his family (sister June 2020) by phone today Disposition: remain in ICU, may be able to move him to [facility] if COVID negative  Labs   CBC: Recent Labs  Lab 09/09/19 0605 09/10/19 0557 09/11/19 0540 09/12/19 0520 09/13/19 0535  WBC 19.5* 19.1* 18.3* 15.7* 16.5*  NEUTROABS 16.3* 16.1* 15.4* 13.2* 13.6*  HGB 12.0* 11.5* 11.9* 11.2* 11.1*  HCT 36.9* 35.0* 35.9* 33.1* 33.5*  MCV 95.3 95.4 94.7 94.3 95.4  PLT 440* 424* 396 375 093    Basic Metabolic Panel: Recent Labs  Lab 09/09/19 0605 09/10/19 0557 09/11/19 0540 09/12/19 0520 09/13/19 0535   NA 140 136 133* 136 135  K 3.7 3.5 3.3* 3.7 3.2*  CL 96* 94* 92* 96* 93*  CO2 32 30 28 28  32  GLUCOSE 148* 137* 143* 144* 112*  BUN 55* 63* 60* 49* 48*  CREATININE 0.84 0.95 0.96 0.84 0.79  CALCIUM 9.9 9.8 9.7 9.4 9.2  MG 2.4 2.3 2.2 2.2 2.0  PHOS 4.3 3.9 3.0 2.7 2.0*   GFR: Estimated Creatinine Clearance: 113.4 mL/min (by C-G formula based on SCr of 0.79 mg/dL). Recent Labs  Lab 09/10/19 0557 09/11/19 0540 09/12/19 0520 09/13/19 0535  WBC 19.1* 18.3* 15.7* 16.5*    Liver Function Tests: Recent Labs  Lab 09/09/19 0605 09/10/19 0557 09/11/19 0540 09/12/19 0520 09/13/19 0535  AST 26 27 24 21 21   ALT 31 30 32 30 33  ALKPHOS 33* 32* 33* 31* 29*  BILITOT 0.8 0.3 0.5 0.3 0.5  PROT 6.3* 5.9* 6.0* 5.7* 5.4*  ALBUMIN 3.0* 2.9* 3.0* 2.7* 2.6*   No results for input(s): LIPASE, AMYLASE in the last 168 hours. No results for input(s): AMMONIA in the last 168 hours.  ABG    Component Value Date/Time   PHART 7.485 (H) 09/02/2019 0234   PCO2ART 51.2 (H) 09/02/2019 0234   PO2ART 152.0 (H) 09/02/2019 0234   HCO3 38.3 (H) 09/02/2019 0234   TCO2 40 (H) 09/02/2019 0234   O2SAT 99.0 09/02/2019 0234     Coagulation Profile: Recent Labs  Lab 09/08/19 0600 09/09/19 0605 09/10/19 0557 09/11/19 0540 09/12/19 0520  INR 1.1 1.1 1.1 1.1 1.1    Cardiac Enzymes: No results for input(s): CKTOTAL, CKMB, CKMBINDEX, TROPONINI in the last 168 hours.  HbA1C: No results found for: HGBA1C  CBG: Recent Labs  Lab 09/12/19 0757 09/12/19 1244 09/12/19 1526 09/13/19 0159 09/13/19 0729  GLUCAP 133* 154* 160* 110* 82     Critical care time: n/a      Roselie Awkward, MD [facility] Pager: [phone] Cell: [phone] If no response, call [phone]

## 2019-09-13 NOTE — Progress Notes (Signed)
Nutrition Follow-up  DOCUMENTATION CODES:   Obesity unspecified  INTERVENTION:   Encourage PO intake  Pt receiving Hormel Shake daily with Breakfast which provides 520 kcals and 22 g of protein and Magic cup BID with lunch and dinner, each supplement provides 290 kcal and 9 grams of protein, automatically on meal trays to optimize nutritional intake.   60 ml Prostat BID - taken by OG tube  Resume as needed:  Vital 1.5 @ 45 ml/hr (1080 ml/day) via PEG Provides: 2020 kcal, 132 grams protein, and 825 ml free water.   NUTRITION DIAGNOSIS:   Increased nutrient needs related to acute illness(COVID-19) as evidenced by estimated needs. Ongoing.   GOAL:   Provide needs based on ASPEN/SCCM guidelines Met.   MONITOR:   Vent status, TF tolerance, Labs, Weight trends  REASON FOR ASSESSMENT:   Ventilator, Consult Enteral/tube feeding initiation and management  ASSESSMENT:   68 y.o. male with medical history of trach-dependent respiratory failure, PEG tube, CHF, fibromyalgia/chronic pain, and afib presenting with COVID-19 infection. Patient was admitted to [facility] on 7/15 after prolonged hospitalization at [facility] in [place] during which time he required trach and PEG placements. He was recently treated for HVAP at [place]. His sister preferred code status to be DNR.  Pt discussed during ICU rounds and with RN.  PO intake varies from 0-90% of meals  Per RN has been eating >50% most meals.  Pt turned down by [facility] and does not want to return to [facility]. CSW working on possible out of state vent SNF.   Medications reviewed and include: vitamin D, colace,  Miralax, KNaPhos 2 packets TID, KCl 20 mEq BID, Vitamin C 500 mg daily, Zinc 220 mg daily  Labs reviewed: K+3.2 (L), PO4: 2 (L)    Diet Order:   Diet Order            DIET DYS 2 Room service appropriate? Yes; Fluid consistency: Thin  Diet effective now              EDUCATION NEEDS:   No education needs have been  identified at this time  Skin:  Skin Assessment: Skin Integrity Issues: Skin Integrity Issues:: DTI, Unstageable DTI: R heel Unstageable: buttocks  Last BM:  10/19  Height:   Ht Readings from Last 1 Encounters:  09/03/19 '6\' 2"'  (1.88 m)    Weight:   Wt Readings from Last 1 Encounters:  09/13/19 106.7 kg    Ideal Body Weight:  86.4 kg  BMI:  Body mass index is 30.2 kg/m.  Estimated Nutritional Needs:   Kcal:  2000-2400  Protein:  125-163 grams  Fluid:  >/= 2 L/day  Maylon Peppers RD, LDN, CNSC [phone] Pager [phone] After Hours Pager

## 2019-09-13 NOTE — Plan of Care (Signed)
  Problem: Respiratory: Goal: Will maintain a patent airway Outcome: Progressing   Problem: Clinical Measurements: Goal: Ability to maintain clinical measurements within normal limits will improve Outcome: Progressing   Problem: Nutrition: Goal: Adequate nutrition will be maintained Outcome: Progressing   Problem: Safety: Goal: Ability to remain free from injury will improve Outcome: Progressing

## 2019-09-13 NOTE — Progress Notes (Signed)
Called pt's sister to update of pt condition and plan of care. Pt's sister appreciative of update

## 2019-09-13 NOTE — Progress Notes (Addendum)
CSW spoke with patients sister, Sergio Mitchell, yesterday 10/20 to further discuss barriers to discharge and barriers to place patient out of state at new facility.   Patient is currently at [facility] located here in [place]- referral was sent to [facility] on 10/19 by Carles Collet and referral was declined.   Sister Sergio Mitchell voiced many options about patients discharge including; patient going to a Rehab SNF and going to a out-of-state Vent SNF. CSW informed Sergio Mitchell of barriers to both of these options; patient is currently at 40-50% o2 on his ventilator therefore will require a Vent SNF not a Rehab SNF (unless weaned to 28%). If patient were to go out-of-state for a Vent SNF- an out of state Medicaid application would need to be completed which would need to be done by family.   Sergio Mitchell questioned if a [place] Medicaid application could be completed during his current stay at the hospital- CSW let her know that financial counseling services typically completed Medicaid applications however since patient already has insurance they would not complete this. Patients family would need to look into completing this application.   Sergio Mitchell stated they may consider taking patient home if they could have 24/7 care for him.   CSW feels family has unrealistic expectations for discharge and feels a conference call with the care team may be beneficial.     Sergio Spittle, LCSW [facility]  [phone]

## 2019-09-14 ENCOUNTER — Inpatient Hospital Stay (HOSPITAL_COMMUNITY): Payer: Medicare Other

## 2019-09-14 DIAGNOSIS — J069 Acute upper respiratory infection, unspecified: Secondary | ICD-10-CM | POA: Diagnosis not present

## 2019-09-14 DIAGNOSIS — U071 COVID-19: Secondary | ICD-10-CM | POA: Diagnosis not present

## 2019-09-14 DIAGNOSIS — J9622 Acute and chronic respiratory failure with hypercapnia: Secondary | ICD-10-CM | POA: Diagnosis not present

## 2019-09-14 DIAGNOSIS — J9601 Acute respiratory failure with hypoxia: Secondary | ICD-10-CM

## 2019-09-14 DIAGNOSIS — J9621 Acute and chronic respiratory failure with hypoxia: Secondary | ICD-10-CM | POA: Diagnosis not present

## 2019-09-14 LAB — COMPREHENSIVE METABOLIC PANEL
ALT: 41 U/L (ref 0–44)
AST: 25 U/L (ref 15–41)
Albumin: 2.7 g/dL — ABNORMAL LOW (ref 3.5–5.0)
Alkaline Phosphatase: 40 U/L (ref 38–126)
Anion gap: 10 (ref 5–15)
BUN: 46 mg/dL — ABNORMAL HIGH (ref 8–23)
CO2: 33 mmol/L — ABNORMAL HIGH (ref 22–32)
Calcium: 9.2 mg/dL (ref 8.9–10.3)
Chloride: 96 mmol/L — ABNORMAL LOW (ref 98–111)
Creatinine, Ser: 0.83 mg/dL (ref 0.61–1.24)
GFR calc Af Amer: >60 mL/min (ref >60)
GFR calc non Af Amer: >60 mL/min (ref >60)
Glucose, Bld: 95 mg/dL (ref 70–99)
Potassium: 3.5 mmol/L (ref 3.5–5.1)
Sodium: 139 mmol/L (ref 135–145)
Total Bilirubin: 0.8 mg/dL (ref 0.3–1.2)
Total Protein: 5.6 g/dL — ABNORMAL LOW (ref 6.5–8.1)

## 2019-09-14 LAB — CBC WITH DIFFERENTIAL/PLATELET
Abs Immature Granulocytes: 0.27 10*3/uL — ABNORMAL HIGH (ref 0.00–0.07)
Basophils Absolute: 0.1 10*3/uL (ref 0.0–0.1)
Basophils Relative: 0 %
Eosinophils Absolute: 0.2 10*3/uL (ref 0.0–0.5)
Eosinophils Relative: 1 %
HCT: 39.6 % (ref 39.0–52.0)
Hemoglobin: 12.8 g/dL — ABNORMAL LOW (ref 13.0–17.0)
Immature Granulocytes: 2 %
Lymphocytes Relative: 12 %
Lymphs Abs: 2 10*3/uL (ref 0.7–4.0)
MCH: 31.5 pg (ref 26.0–34.0)
MCHC: 32.3 g/dL (ref 30.0–36.0)
MCV: 97.5 fL (ref 80.0–100.0)
Monocytes Absolute: 1.4 10*3/uL — ABNORMAL HIGH (ref 0.1–1.0)
Monocytes Relative: 9 %
Neutro Abs: 12.6 10*3/uL — ABNORMAL HIGH (ref 1.7–7.7)
Neutrophils Relative %: 76 %
Platelets: 321 10*3/uL (ref 150–400)
RBC: 4.06 MIL/uL — ABNORMAL LOW (ref 4.22–5.81)
RDW: 14.1 % (ref 11.5–15.5)
WBC: 16.6 10*3/uL — ABNORMAL HIGH (ref 4.0–10.5)
nRBC: 0 % (ref 0.0–0.2)

## 2019-09-14 LAB — GLUCOSE, CAPILLARY
Glucose-Capillary: 99 mg/dL (ref 70–99)
Glucose-Capillary: 141 mg/dL — ABNORMAL HIGH (ref 70–99)
Glucose-Capillary: 105 mg/dL — ABNORMAL HIGH (ref 70–99)
Glucose-Capillary: 156 mg/dL — ABNORMAL HIGH (ref 70–99)

## 2019-09-14 LAB — MAGNESIUM: Magnesium: 2.1 mg/dL (ref 1.7–2.4)

## 2019-09-14 LAB — PROTIME-INR
INR: 1.1 (ref 0.8–1.2)
Prothrombin Time: 13.8 seconds (ref 11.4–15.2)

## 2019-09-14 LAB — PHOSPHORUS: Phosphorus: 2.7 mg/dL (ref 2.5–4.6)

## 2019-09-14 LAB — FIBRINOGEN: Fibrinogen: 469 mg/dL (ref 210–475)

## 2019-09-14 MED ORDER — ACETAMINOPHEN 160 MG/5ML PO SOLN: 650.0000 mg | Freq: Four times a day (QID) | ORAL | Status: DC | PRN

## 2019-09-14 MED ORDER — IPRATROPIUM-ALBUTEROL 0.5-2.5 (3) MG/3ML IN SOLN
3.0000 mL | Freq: Four times a day (QID) | RESPIRATORY_TRACT | Status: DC
  Administered 2019-09-14 – 2019-09-16 (×8): 3 mL via RESPIRATORY_TRACT
  Filled 2019-09-14 (×7): qty 3

## 2019-09-14 MED ORDER — POTASSIUM CHLORIDE 20 MEQ/15ML (10%) PO SOLN: 40.0000 meq | Freq: Once | ORAL | Status: DC

## 2019-09-14 MED ORDER — POTASSIUM CHLORIDE 20 MEQ/15ML (10%) PO SOLN
40.0000 meq | Freq: Once | ORAL | Status: AC
  Administered 2019-09-14: 12:00:00 40 meq
  Filled 2019-09-14: qty 30

## 2019-09-14 MED ORDER — ACETAMINOPHEN 325 MG PO TABS: 650.0000 mg | ORAL_TABLET | Freq: Four times a day (QID) | ORAL | Status: DC | PRN

## 2019-09-14 MED ORDER — POTASSIUM CHLORIDE 20 MEQ/15ML (10%) PO SOLN
40.0000 meq | Freq: Once | ORAL | Status: AC
  Administered 2019-09-14: 40 meq
  Filled 2019-09-14: qty 30

## 2019-09-14 NOTE — Progress Notes (Deleted)
Notified ELink that patient remains restless while maxed on precedex drip. Confusion has gotten worse. Continues to reach for IVs and NRB mask. Will monitor closely 

## 2019-09-14 NOTE — Progress Notes (Signed)
Sergio FranklinWilliam C Watanabe  ZOX:[id number] DOB: 12/20/1950 DOA: 09/02/2019 PCP: Sherron Mondayejan-Sie, S Ahmed, MD    Brief Narrative:  68 year old with a history of 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  He was originally admitted to the [facility] in [place] on May 25 after he presented with chest pain and shortness of breath.  He was found to be in cardiogenic shock with an EF of 20% and then developed septic shock related to Haemophilus influenza pneumonia.  Following his prolonged acute hospitalization he was transferred to [facility] on July 15.  He was [facility] ED 10/10 with persistent hypoxia in the setting of COVID-19.  He had a positive Covid test 10/7 while at [facility].  Significant Events: 10/10 [facility]  10/13 repeat testing negative  COVID-19 specific Treatment: Solu-Medrol 10/10 > 10/20 Remdesivir 10/10 > 10/14 Convalescent plasma 10/10  Subjective: Alert and interactive.  Eating breakfast at time of exam.  Denies chest pain fevers chills nausea vomiting.  Reports intermittent shortness of breath.  Reports severe chronic back and neck pain.  Assessment & Plan:  Acute on chronic hypoxic and hypercapnic respiratory failure -tracheostomy dependent - COVID pneumonia Tracheostomy placed May 2020 - ventilator management per PCCM -appears stable presently -attempts being made at weaning but patient has now been vent dependent for prolonged period of time  Chronic restrictive lung 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  Chronic systolic CHF Chronically on Lasix 40 mg daily -net -1500 cc last 24 hours and net -10L since admit -no gross overload at present [employer]   09/12/19 0500 09/13/19 0416 09/14/19 0404  Weight: 107.9 kg 106.7 kg 106.6 kg    Atrial fibrillation NSR presently -does not appear to be on chronic anticoagulation -is chronically on  81 mg aspirin and Coreg  Hypokalemia Continue to supplement to goal of 4.0  Hypophosphatemia Corrected with supplementation  Stage I sacral decubitus ulcer  Toxic metabolic encephalopathy Appears to have resolved at this time  Depression/anxiety  DVT prophylaxis: COVID-PACT Code Status: NO CODE - DNR  Family Communication: Per PCCM Disposition Plan: Has refused return to [facility] -PT/OT evaluations pending  Consultants:  none  Antimicrobials:  None  Objective: Blood pressure 111/81, pulse 81, temperature 98.2 F (36.8 C), temperature source Axillary, resp. rate (!) 24, height 6\' 2"  (1.88 m), weight 106.6 kg, SpO2 93 %.  Intake/Output Summary (Last 24 hours) at 09/14/2019 0813 Last data filed at 09/14/2019 0200 Gross per 24 hour  Intake 220 ml  Output 2000 ml  Net -1780 ml   Filed Weights   09/12/19 0500 09/13/19 0416 09/14/19 0404  Weight: 107.9 kg 106.7 kg 106.6 kg    Examination: General: No acute respiratory distress on ventilator via trach Lungs: Good air movement throughout with mild expiratory wheezing appreciable Cardiovascular: RRR without murmur or rub Abdomen: Overweight, soft, bowel sounds positive, no rebound Extremities: Trace bilateral lower extremity edema without change  CBC: Recent Labs  Lab 09/12/19 0520 09/13/19 0535 09/14/19 0520  WBC 15.7* 16.5* 16.6*  NEUTROABS 13.2* 13.6* 12.6*  HGB 11.2* 11.1* 12.8*  HCT 33.1* 33.5* 39.6  MCV 94.3 95.4 97.5  PLT 375 356 321   Basic Metabolic Panel: Recent Labs  Lab 09/12/19 0520 09/13/19 0535 09/14/19 0520  NA 136 135 139  K 3.7 3.2* 3.5  CL 96* 93* 96*  CO2 28 32 33*  GLUCOSE 144* 112* 95  BUN  49* 48* 46*  CREATININE 0.84 0.79 0.83  CALCIUM 9.4 9.2 9.2  MG 2.2 2.0 2.1  PHOS 2.7 2.0* 2.7   GFR: Estimated Creatinine Clearance: 109.3 mL/min (by C-G formula based on SCr of 0.83 mg/dL).  Liver Function Tests: Recent Labs  Lab 09/11/19 0540 09/12/19 0520 09/13/19 0535 09/14/19  0520  AST 24 21 21 25   ALT 32 30 33 41  ALKPHOS 33* 31* 29* 40  BILITOT 0.5 0.3 0.5 0.8  PROT 6.0* 5.7* 5.4* 5.6*  ALBUMIN 3.0* 2.7* 2.6* 2.7*    Coagulation Profile: Recent Labs  Lab 09/10/19 0557 09/11/19 0540 09/12/19 0520 09/13/19 0535 09/14/19 0520  INR 1.1 1.1 1.1 1.1 1.1    CBG: Recent Labs  Lab 09/13/19 0159 09/13/19 0729 09/13/19 1224 09/13/19 1736 09/13/19 2152  GLUCAP 110* 82 116* 109* 92    Recent Results (from the past 240 hour(s))  Novel Coronavirus, NAA (Hosp order, Send-out to Ref Lab; TAT 18-24 hrs     Status: None   Collection Time: 09/05/19  1:35 PM   Specimen: Nasopharyngeal Swab; Respiratory  Result Value Ref Range Status   SARS-CoV-2, NAA NOT DETECTED NOT DETECTED Final    Comment: (NOTE) This nucleic acid amplification test was developed and its performance characteristics determined by 09/07/19. Nucleic acid amplification tests include PCR and TMA. This test has not been FDA cleared or approved. This test has been authorized by FDA under an Emergency Use Authorization (EUA). This test is only authorized for the duration of time the declaration that circumstances exist justifying the authorization of the emergency use of in vitro diagnostic tests for detection of SARS-CoV-2 virus and/or diagnosis of COVID-19 infection under section 564(b)(1) of the Act, 21 U.S.C. [employer]) (1), unless the authorization is terminated or revoked sooner. When diagnostic testing is negative, the possibility of a false negative result should be considered in the context of a patient's recent exposures and the presence of clinical signs and symptoms consistent with COVID-19. An individual without symptoms of COVID- 19 and who is not shedding SARS-CoV-2 vi rus would expect to have a negative (not detected) result in this assay. Performed At: [facility] [address] [place], [place] [id number] MD Jolene Schimke     Coronavirus Source NASOPHARYNGEAL  Final    Comment: Performed at [facility], [address], [place], [place]     Scheduled Meds: . aspirin  81 mg Per Tube Daily  . atorvastatin  20 mg Per Tube Daily  . carvedilol  12.5 mg Per Tube BID WC  . chlorhexidine  15 mL Mouth/Throat BID  . Chlorhexidine Gluconate Cloth  6 each Topical Daily  . cholecalciferol  2,000 Units Per Tube Daily  . clonazePAM  1 mg Per Tube Q8H  . docusate  100 mg Per Tube Daily  . enoxaparin (LOVENOX) injection  40 mg Subcutaneous Q24H  . escitalopram  10 mg Per Tube Daily  . famotidine  20 mg Per Tube QHS  . feeding supplement (PRO-STAT SUGAR FREE 64)  60 mL Per Tube BID  . furosemide  40 mg Intravenous Daily  . insulin aspart  0-5 Units Subcutaneous QHS  . insulin aspart  0-9 Units Subcutaneous TID WC  . morphine  15 mg Per Tube Q8H  . mupirocin ointment   Nasal BID  . polyethylene glycol  17 g Per Tube Daily  . sodium chloride flush  3 mL Intravenous Q12H  . vitamin C  500 mg  Oral Daily  . zinc sulfate  220 mg Oral Daily   Continuous Infusions: . sodium chloride Stopped (09/13/19 0739)  . feeding supplement (VITAL 1.5 CAL) Stopped (09/08/19 1200)     LOS: 12 days   Cherene Altes, MD [facility] Office  [phone] Pager - Text Page per Amion  If 7PM-7AM, please contact night-coverage per Amion 09/14/2019, 8:13 AM

## 2019-09-14 NOTE — Progress Notes (Signed)
ANTICOAGULATION CONSULT NOTE - Follow Up Consult  Pharmacy Consult for Lovenox Indication: VTE prophylaxis (COVID-PACT trial)  Allergies  Allergen Reactions  . Bacitracin Other (See Comments)    Per MAR  . Cefdinir Other (See Comments)    Per MAR  . Lidocaine Other (See Comments)    Per MAR  . Neomycin Other (See Comments)    Per MAR  . Neosporin Af [Miconazole] Other (See Comments)    Unknown 9.7.2020 Per [facility] MAR pt is currently using Miconazole Topical [place]  . Pantoprazole Other (See Comments)    unknown  . Penicillins Other (See Comments)    Per MAR  . Polymyxin B Other (See Comments)    Per MAR  . Pramoxine Other (See Comments)    Per [facility]    Patient Measurements: Height: 6\' 2"  (188 cm) Weight: 235 lb 0.2 oz (106.6 kg) IBW/kg (Calculated) : 82.2  Vital Signs: Temp: 98.2 F (36.8 C) (10/22 0400) Temp Source: Axillary (10/22 0400) BP: 111/81 (10/22 0605) Pulse Rate: 81 (10/22 0605)  Labs: Recent Labs    09/12/19 0520 09/13/19 0535 09/14/19 0520  HGB 11.2* 11.1* 12.8*  HCT 33.1* 33.5* 39.6  PLT 375 356 321  LABPROT 13.6 14.0 13.8  INR 1.1 1.1 1.1  CREATININE 0.84 0.79 0.83    Estimated Creatinine Clearance: 109.3 mL/min (by C-G formula based on SCr of 0.83 mg/dL).   Medical History: Past Medical History:  Diagnosis Date  . DDD (degenerative disc disease), lumbar   . Fibromyositis   . Osteoporosis   . S/P percutaneous endoscopic gastrostomy (PEG) tube placement (HCC)   . Scoliosis   . Spinal stenosis   . Systolic CHF, chronic (HCC) 04/2019   EF 20-25%, ACID recommended  . Tracheostomy tube present (HCC)   . Ventilator dependence (HCC)     Medications:  Scheduled:  . aspirin  81 mg Per Tube Daily  . atorvastatin  20 mg Per Tube Daily  . carvedilol  12.5 mg Per Tube BID WC  . chlorhexidine  15 mL Mouth/Throat BID  . Chlorhexidine Gluconate Cloth  6 each Topical Daily  . cholecalciferol  2,000 Units Per Tube Daily  . clonazePAM  1 mg  Per Tube Q8H  . docusate  100 mg Per Tube Daily  . enoxaparin (LOVENOX) injection  40 mg Subcutaneous Q24H  . escitalopram  10 mg Per Tube Daily  . famotidine  20 mg Per Tube QHS  . feeding supplement (PRO-STAT SUGAR FREE 64)  60 mL Per Tube BID  . furosemide  40 mg Intravenous Daily  . insulin aspart  0-5 Units Subcutaneous QHS  . insulin aspart  0-9 Units Subcutaneous TID WC  . morphine  15 mg Per Tube Q8H  . mupirocin ointment   Nasal BID  . polyethylene glycol  17 g Per Tube Daily  . sodium chloride flush  3 mL Intravenous Q12H  . vitamin C  500 mg Oral Daily  . zinc sulfate  220 mg Oral Daily   Infusions:  . sodium chloride Stopped (09/13/19 0739)  . feeding supplement (VITAL 1.5 CAL) Stopped (09/08/19 1200)   PRN: sodium chloride, acetaminophen, bisacodyl, fentaNYL (SUBLIMAZE) injection, ipratropium-albuterol, midazolam, ondansetron **OR** ondansetron (ZOFRAN) IV, oxyCODONE, QUEtiapine, sodium chloride flush, zolpidem  Assessment: 68 yo male admitted from [facility] to [facility] on 09/02/2019 for hypoxemia due to COVID 19 pneumonia. Patient has been enrolled into the COVID-PACT study and assigned to Lovenox VTE prophylaxis arm.   Patient has been receiving enoxaparin 40mg  q24h  since trial enrollment which is appropriate based on CrCl >30 and BMI <35. d-Dimer down to 0.85 on 10/20. Hgb and plt are stable. No overt bleeding noted.   Goal of Therapy:  Prevention of VTE Monitor platelets by anticoagulation protocol: Yes   Plan:  Continue enoxaparin 40mg  SQ q24h per COVID-PACT protocol Monitor CBC and signs/symptoms of bleeding  Richardine Service, PharmD PGY1 Pharmacy Resident 09/14/2019  8:30 AM

## 2019-09-14 NOTE — Progress Notes (Signed)
NAME:  Sergio Mitchell, MRN:  [id number], DOB:  Aug 31, 1951, LOS: 12 ADMISSION DATE:  09/02/2019, CONSULTATION DATE:  10/10 REFERRING MD:  Ophelia Charter, CHIEF COMPLAINT:  Dyspnea   Brief History   68 yo male transferred from [facility] on 09/02/19 with worsening hypoxia in setting of COVID pneumonia.  He has chronic trach with intermittent vent dependence since hospitalization in [place] May 2020 in setting of chronic systolic CHF and restrictive lung disease from scoliosis.  Had resided in [facility] since 06/07/19.  Past Medical History  Systolic heart failure A. fib Scoliosis Osteoporosis Spinal stenosis Fibromyalgia Chronic pain Chronic respiratory failure with hypoxemia requiring tracheostomy and persistent ventilator use after May 2020  Significant Hospital Events   10/10 transfer from [facility], convalescent plasma, start decadron and remdesivir 10/11 worsening confusion, hypoxemia; 10/15 diuresis for acute pulmonary edema 10/18 start pressure support weaning trials  Consults:  PCCM  Procedures:  Tracheostomy 5/20 >   Significant Diagnostic Tests:  CT angio chest 10/11 >> calcified LNs, ATX Lt base with volume loss and mediastinal shift to Lt, multifocal GGO in LUL and RUL  Micro Data:  SARS COV2 10/07 >> POSITIVE (from [facility]) Sputum 10/11 >> oral flora SARS COV2 10/13 >> not detected   Antimicrobials/COVID Rx:   Convalescent plasma 10/10 Steroids 10/10 >> 10/20 Remdesivir 10/10 >> 10/14   Interim history/subjective:   Pressure support for much of the day yesterday Attempted weaning this morning but didn't trigger vent Now eating breakfast   Objective   Blood pressure 111/81, pulse 81, temperature 98.2 F (36.8 C), temperature source Axillary, resp. rate (!) 24, height 6\' 2"  (1.88 m), weight 106.6 kg, SpO2 98 %.    Vent Mode: PRVC FiO2 (%):  [40 %-60 %] 40 % Set Rate:  [24 bmp] 24 bmp Vt Set:  [500 mL] 500 mL PEEP:  [5 cmH20-8 cmH20] 5 cmH20 Pressure Support:  [8  cmH20] 8 cmH20 Plateau Pressure:  [18 cmH20-22 cmH20] 22 cmH20   Intake/Output Summary (Last 24 hours) at 09/14/2019 09/16/2019 Last data filed at 09/14/2019 0200 Gross per 24 hour  Intake 220 ml  Output 2000 ml  Net -1780 ml   Filed Weights   09/12/19 0500 09/13/19 0416 09/14/19 0404  Weight: 107.9 kg 106.7 kg 106.6 kg    Examination:  General:  In bed on vent HENT: NCAT tracheostomy in place PULM: CTA B, vent supported breathing CV: RRR, no mgr GI: BS+, soft, nontender MSK: normal bulk and tone Neuro: awake, following commands  10/20 CXR images personally reviewed: bibasilar atelectasis, left lower lobe collapse, cardiomegally  Resolved Hospital Problem list     Assessment & Plan:  Acute on chronic respiratory failure from COVID-19 pneumonia, atelectasis, volume loss left lower lobe and acute pulmonary edema Restrictive lung disease from scoliosis Tracheostomy and intermittent ventilator dependent since May 2020 Pressure support as long as tolerated today Out of bed today PT consult Resume full vent support at night Pulmonary toilette Tracheostomy care per routine duoneb > change q4h prn  Acute on chronic systolic congestive heart failure Lasix prn Monitor I/O  Best practice:  Diet: tube feeding and po Pain/Anxiety/Delirium protocol (if indicated): n/a VAP protocol (if indicated): n/a DVT prophylaxis: lovenox per COVID PACT trial GI prophylaxis: famotidine Glucose control: SSI Mobility: PT/OT range of motion work Code Status: DNR Family Communication: I updated his sister June 2020 and her husband today Disposition: needs placement; ideally given the setback from COVID, I think he would do best in an LTAC environment  Labs  CBC: Recent Labs  Lab 09/10/19 0557 09/11/19 0540 09/12/19 0520 09/13/19 0535 09/14/19 0520  WBC 19.1* 18.3* 15.7* 16.5* 16.6*  NEUTROABS 16.1* 15.4* 13.2* 13.6* 12.6*  HGB 11.5* 11.9* 11.2* 11.1* 12.8*  HCT 35.0* 35.9* 33.1* 33.5* 39.6   MCV 95.4 94.7 94.3 95.4 97.5  PLT 424* 396 375 356 427    Basic Metabolic Panel: Recent Labs  Lab 09/10/19 0557 09/11/19 0540 09/12/19 0520 09/13/19 0535 09/14/19 0520  NA 136 133* 136 135 139  K 3.5 3.3* 3.7 3.2* 3.5  CL 94* 92* 96* 93* 96*  CO2 30 28 28  32 33*  GLUCOSE 137* 143* 144* 112* 95  BUN 63* 60* 49* 48* 46*  CREATININE 0.95 0.96 0.84 0.79 0.83  CALCIUM 9.8 9.7 9.4 9.2 9.2  MG 2.3 2.2 2.2 2.0 2.1  PHOS 3.9 3.0 2.7 2.0* 2.7   GFR: Estimated Creatinine Clearance: 109.3 mL/min (by C-G formula based on SCr of 0.83 mg/dL). Recent Labs  Lab 09/11/19 0540 09/12/19 0520 09/13/19 0535 09/14/19 0520  WBC 18.3* 15.7* 16.5* 16.6*    Liver Function Tests: Recent Labs  Lab 09/10/19 0557 09/11/19 0540 09/12/19 0520 09/13/19 0535 09/14/19 0520  AST 27 24 21 21 25   ALT 30 32 30 33 41  ALKPHOS 32* 33* 31* 29* 40  BILITOT 0.3 0.5 0.3 0.5 0.8  PROT 5.9* 6.0* 5.7* 5.4* 5.6*  ALBUMIN 2.9* 3.0* 2.7* 2.6* 2.7*   No results for input(s): LIPASE, AMYLASE in the last 168 hours. No results for input(s): AMMONIA in the last 168 hours.  ABG    Component Value Date/Time   PHART 7.485 (H) 09/02/2019 0234   PCO2ART 51.2 (H) 09/02/2019 0234   PO2ART 152.0 (H) 09/02/2019 0234   HCO3 38.3 (H) 09/02/2019 0234   TCO2 40 (H) 09/02/2019 0234   O2SAT 99.0 09/02/2019 0234     Coagulation Profile: Recent Labs  Lab 09/10/19 0557 09/11/19 0540 09/12/19 0520 09/13/19 0535 09/14/19 0520  INR 1.1 1.1 1.1 1.1 1.1    Cardiac Enzymes: No results for input(s): CKTOTAL, CKMB, CKMBINDEX, TROPONINI in the last 168 hours.  HbA1C: No results found for: HGBA1C  CBG: Recent Labs  Lab 09/13/19 0159 09/13/19 0729 09/13/19 1224 09/13/19 1736 09/13/19 2152  GLUCAP 110* 82 116* 109* 92     Critical care time: n/a      Roselie Awkward, MD [facility] Pager: [phone] Cell: [phone] If no response, call [phone]

## 2019-09-14 NOTE — Progress Notes (Signed)
Offered for patient to video chat with his sister via [place], but he said he would rather wait until tomorrow. Called patient's sister to let her know and give her an update on patient's status. All questions and concerns addressed. Appreciative of the update.

## 2019-09-15 ENCOUNTER — Inpatient Hospital Stay (HOSPITAL_COMMUNITY): Payer: Medicare Other

## 2019-09-15 DIAGNOSIS — J9621 Acute and chronic respiratory failure with hypoxia: Secondary | ICD-10-CM | POA: Diagnosis not present

## 2019-09-15 DIAGNOSIS — J9622 Acute and chronic respiratory failure with hypercapnia: Secondary | ICD-10-CM | POA: Diagnosis not present

## 2019-09-15 LAB — PROTIME-INR
INR: 1.1 (ref 0.8–1.2)
Prothrombin Time: 13.8 seconds (ref 11.4–15.2)

## 2019-09-15 LAB — PHOSPHORUS: Phosphorus: 2.6 mg/dL (ref 2.5–4.6)

## 2019-09-15 LAB — COMPREHENSIVE METABOLIC PANEL
ALT: 35 U/L (ref 0–44)
AST: 21 U/L (ref 15–41)
Albumin: 2.7 g/dL — ABNORMAL LOW (ref 3.5–5.0)
Alkaline Phosphatase: 43 U/L (ref 38–126)
Anion gap: 10 (ref 5–15)
BUN: 43 mg/dL — ABNORMAL HIGH (ref 8–23)
CO2: 31 mmol/L (ref 22–32)
Calcium: 9.2 mg/dL (ref 8.9–10.3)
Chloride: 99 mmol/L (ref 98–111)
Creatinine, Ser: 0.85 mg/dL (ref 0.61–1.24)
GFR calc Af Amer: >60 mL/min (ref >60)
GFR calc non Af Amer: >60 mL/min (ref >60)
Glucose, Bld: 101 mg/dL — ABNORMAL HIGH (ref 70–99)
Potassium: 3.2 mmol/L — ABNORMAL LOW (ref 3.5–5.1)
Sodium: 140 mmol/L (ref 135–145)
Total Bilirubin: 0.7 mg/dL (ref 0.3–1.2)
Total Protein: 5.6 g/dL — ABNORMAL LOW (ref 6.5–8.1)

## 2019-09-15 LAB — CBC WITH DIFFERENTIAL/PLATELET
Abs Immature Granulocytes: 0.14 10*3/uL — ABNORMAL HIGH (ref 0.00–0.07)
Basophils Absolute: 0 10*3/uL (ref 0.0–0.1)
Basophils Relative: 0 %
Eosinophils Absolute: 0.5 10*3/uL (ref 0.0–0.5)
Eosinophils Relative: 3 %
HCT: 39 % (ref 39.0–52.0)
Hemoglobin: 12.5 g/dL — ABNORMAL LOW (ref 13.0–17.0)
Immature Granulocytes: 1 %
Lymphocytes Relative: 13 %
Lymphs Abs: 1.8 10*3/uL (ref 0.7–4.0)
MCH: 31.3 pg (ref 26.0–34.0)
MCHC: 32.1 g/dL (ref 30.0–36.0)
MCV: 97.5 fL (ref 80.0–100.0)
Monocytes Absolute: 1.1 10*3/uL — ABNORMAL HIGH (ref 0.1–1.0)
Monocytes Relative: 8 %
Neutro Abs: 10.3 10*3/uL — ABNORMAL HIGH (ref 1.7–7.7)
Neutrophils Relative %: 75 %
Platelets: 315 10*3/uL (ref 150–400)
RBC: 4 MIL/uL — ABNORMAL LOW (ref 4.22–5.81)
RDW: 14.1 % (ref 11.5–15.5)
WBC: 13.8 10*3/uL — ABNORMAL HIGH (ref 4.0–10.5)
nRBC: 0 % (ref 0.0–0.2)

## 2019-09-15 LAB — GLUCOSE, CAPILLARY
Glucose-Capillary: 109 mg/dL — ABNORMAL HIGH (ref 70–99)
Glucose-Capillary: 110 mg/dL — ABNORMAL HIGH (ref 70–99)
Glucose-Capillary: 110 mg/dL — ABNORMAL HIGH (ref 70–99)
Glucose-Capillary: 99 mg/dL (ref 70–99)

## 2019-09-15 MED ORDER — POTASSIUM CHLORIDE 20 MEQ/15ML (10%) PO SOLN
40.0000 meq | Freq: Two times a day (BID) | ORAL | Status: DC
  Administered 2019-09-15 (×2): 40 meq via ORAL
  Filled 2019-09-15 (×2): qty 30

## 2019-09-15 MED ORDER — ENOXAPARIN SODIUM 40 MG/0.4ML ~~LOC~~ SOLN: 40.0000 mg | SUBCUTANEOUS | Status: AC

## 2019-09-15 MED ORDER — FUROSEMIDE 40 MG PO TABS
40.0000 mg | ORAL_TABLET | Freq: Every day | ORAL | Status: DC
  Administered 2019-09-15: 12:00:00 40 mg via ORAL
  Filled 2019-09-15: qty 1

## 2019-09-15 MED ORDER — POLYETHYLENE GLYCOL 3350 17 G PO PACK: 17.0000 g | PACK | Freq: Every day | ORAL | Status: DC

## 2019-09-15 MED ORDER — INSULIN ASPART 100 UNIT/ML ~~LOC~~ SOLN: 0.0000 [IU] | Freq: Three times a day (TID) | SUBCUTANEOUS | 11 refills | Status: AC

## 2019-09-15 MED ORDER — BISACODYL 5 MG PO TBEC: 5.0000 mg | DELAYED_RELEASE_TABLET | Freq: Every day | ORAL | 0 refills | Status: AC | PRN

## 2019-09-15 MED ORDER — ACETAMINOPHEN 160 MG/5ML PO SOLN: 650.0000 mg | Freq: Four times a day (QID) | ORAL | 0 refills | Status: AC | PRN

## 2019-09-15 MED ORDER — ASPIRIN 81 MG PO CHEW: 81.0000 mg | CHEWABLE_TABLET | Freq: Every day | ORAL | Status: AC

## 2019-09-15 MED ORDER — IPRATROPIUM-ALBUTEROL 0.5-2.5 (3) MG/3ML IN SOLN: 3.0000 mL | RESPIRATORY_TRACT | Status: AC | PRN

## 2019-09-15 MED ORDER — CARVEDILOL 12.5 MG PO TABS: 12.5000 mg | ORAL_TABLET | Freq: Two times a day (BID) | ORAL | Status: AC

## 2019-09-15 MED ORDER — OXYCODONE HCL 5 MG PO TABS: 5.0000 mg | ORAL_TABLET | ORAL | 0 refills | Status: AC | PRN

## 2019-09-15 MED ORDER — IPRATROPIUM-ALBUTEROL 0.5-2.5 (3) MG/3ML IN SOLN: 3.0000 mL | Freq: Four times a day (QID) | RESPIRATORY_TRACT | Status: AC

## 2019-09-15 MED ORDER — ATORVASTATIN CALCIUM 10 MG PO TABS
20.0000 mg | ORAL_TABLET | Freq: Every day | ORAL | Status: DC
  Administered 2019-09-15: 20 mg via ORAL
  Filled 2019-09-15: qty 2

## 2019-09-15 MED ORDER — CLONAZEPAM 1 MG PO TABS: 1.0000 mg | ORAL_TABLET | Freq: Three times a day (TID) | ORAL | 0 refills | Status: AC

## 2019-09-15 MED ORDER — ESCITALOPRAM OXALATE 10 MG PO TABS
10.0000 mg | ORAL_TABLET | Freq: Every day | ORAL | Status: DC
  Administered 2019-09-15: 10 mg via ORAL
  Filled 2019-09-15 (×2): qty 1

## 2019-09-15 MED ORDER — POTASSIUM CHLORIDE 20 MEQ/15ML (10%) PO SOLN: 40.0000 meq | Freq: Two times a day (BID) | ORAL | 0 refills | Status: AC

## 2019-09-15 MED ORDER — ACETAMINOPHEN 160 MG/5ML PO SOLN: 650.0000 mg | Freq: Four times a day (QID) | ORAL | Status: DC | PRN

## 2019-09-15 MED ORDER — ATORVASTATIN CALCIUM 20 MG PO TABS: 20.0000 mg | ORAL_TABLET | Freq: Every day | ORAL | Status: AC

## 2019-09-15 MED ORDER — VITAMIN D3 25 MCG PO TABS: 2000.0000 [IU] | ORAL_TABLET | Freq: Every day | ORAL | Status: AC

## 2019-09-15 MED ORDER — CLONAZEPAM 1 MG PO TABS
1.0000 mg | ORAL_TABLET | Freq: Three times a day (TID) | ORAL | Status: DC
  Administered 2019-09-15 (×2): 1 mg via ORAL
  Filled 2019-09-15 (×2): qty 1

## 2019-09-15 MED ORDER — ASPIRIN 81 MG PO CHEW
81.0000 mg | CHEWABLE_TABLET | Freq: Every day | ORAL | Status: DC
  Administered 2019-09-15: 11:00:00 81 mg via ORAL
  Filled 2019-09-15: qty 1

## 2019-09-15 MED ORDER — POLYETHYLENE GLYCOL 3350 17 G PO PACK: 17.0000 g | PACK | Freq: Every day | ORAL | 0 refills | Status: AC

## 2019-09-15 MED ORDER — FAMOTIDINE 20 MG PO TABS
20.0000 mg | ORAL_TABLET | Freq: Every day | ORAL | Status: DC
  Administered 2019-09-15: 20 mg via ORAL
  Filled 2019-09-15: qty 1

## 2019-09-15 MED ORDER — INSULIN ASPART 100 UNIT/ML ~~LOC~~ SOLN: 0.0000 [IU] | Freq: Every day | SUBCUTANEOUS | 11 refills | Status: AC

## 2019-09-15 MED ORDER — ESCITALOPRAM OXALATE 10 MG PO TABS: 10.0000 mg | ORAL_TABLET | Freq: Every day | ORAL | Status: AC

## 2019-09-15 MED ORDER — FUROSEMIDE 40 MG PO TABS: 40.0000 mg | ORAL_TABLET | Freq: Every day | ORAL | Status: AC

## 2019-09-15 MED ORDER — QUETIAPINE FUMARATE 50 MG PO TABS: 50.0000 mg | ORAL_TABLET | Freq: Two times a day (BID) | ORAL | Status: AC | PRN

## 2019-09-15 MED ORDER — MORPHINE SULFATE 15 MG PO TABS
15.0000 mg | ORAL_TABLET | Freq: Three times a day (TID) | ORAL | Status: DC
  Administered 2019-09-15 (×2): 15 mg via ORAL
  Filled 2019-09-15 (×2): qty 1

## 2019-09-15 MED ORDER — PRO-STAT SUGAR FREE PO LIQD: 60.0000 mL | Freq: Two times a day (BID) | ORAL | 0 refills | Status: AC

## 2019-09-15 MED ORDER — ZOLPIDEM TARTRATE 5 MG PO TABS: 5.0000 mg | ORAL_TABLET | Freq: Every evening | ORAL | 0 refills | Status: AC | PRN

## 2019-09-15 MED ORDER — ZINC SULFATE 220 (50 ZN) MG PO CAPS: 220.0000 mg | ORAL_CAPSULE | Freq: Every day | ORAL | Status: AC

## 2019-09-15 MED ORDER — CARVEDILOL 12.5 MG PO TABS
12.5000 mg | ORAL_TABLET | Freq: Two times a day (BID) | ORAL | Status: DC
  Administered 2019-09-15 (×2): 12.5 mg via ORAL
  Filled 2019-09-15 (×4): qty 1

## 2019-09-15 MED ORDER — ONDANSETRON HCL 4 MG/2ML IJ SOLN: 4.0000 mg | Freq: Four times a day (QID) | INTRAMUSCULAR | 0 refills | Status: AC | PRN

## 2019-09-15 MED ORDER — MORPHINE SULFATE 15 MG PO TABS: 15.0000 mg | ORAL_TABLET | Freq: Three times a day (TID) | ORAL | 0 refills | Status: AC

## 2019-09-15 MED ORDER — QUETIAPINE FUMARATE 50 MG PO TABS: 50.0000 mg | ORAL_TABLET | Freq: Two times a day (BID) | ORAL | Status: DC | PRN

## 2019-09-15 MED ORDER — VITAMIN D 25 MCG (1000 UNIT) PO TABS
2000.0000 [IU] | ORAL_TABLET | Freq: Every day | ORAL | Status: DC
  Administered 2019-09-15: 11:00:00 2000 [IU] via ORAL
  Filled 2019-09-15 (×2): qty 2

## 2019-09-15 MED ORDER — DOCUSATE SODIUM 100 MG PO CAPS: 100.0000 mg | ORAL_CAPSULE | Freq: Every day | ORAL | Status: DC

## 2019-09-15 MED ORDER — POTASSIUM CHLORIDE 20 MEQ/15ML (10%) PO SOLN: 40.0000 meq | Freq: Two times a day (BID) | ORAL | Status: DC

## 2019-09-15 MED ORDER — DOCUSATE SODIUM 100 MG PO CAPS: 100.0000 mg | ORAL_CAPSULE | Freq: Every day | ORAL | 0 refills | Status: AC

## 2019-09-15 MED ORDER — OXYCODONE HCL 5 MG PO TABS: 5.0000 mg | ORAL_TABLET | ORAL | Status: DC | PRN

## 2019-09-15 MED ORDER — FAMOTIDINE 20 MG PO TABS: 20.0000 mg | ORAL_TABLET | Freq: Every day | ORAL | Status: AC

## 2019-09-15 NOTE — Evaluation (Signed)
Physical Therapy Evaluation Patient Details Name: Sergio Mitchell MRN: [id number] DOB: [birth year] Today's Date: 09/15/2019   History of Present Illness  68 year old with a history of 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  He was originally admitted to the [place] in [place] on May 25 after he presented with chest pain and shortness of breath.  He was found to be in cardiogenic shock with an EF of 20% and then developed septic shock related to Haemophilus influenza pneumonia.  Following his prolonged acute hospitalization he was transferred to [place] on July 15.  He was transferred from [facility] to the [facility] ED 10/10 with persistent hypoxia in the setting of COVID-19.  He had a positive Covid test 10/7 while at [place].    Clinical Impression  Pt presents with an overall decrease in functional mobility secondary to above. PTA, pt receiving care at [facility], unsure PLOF. Today, pt requiring modA+2 for bed mobility, able to perform prolonged sitting balance with poor balance, demonstrates decreased awareness and problem solving; adamantly declining attempts to stand or transfer to recliner. Pt would benefit from continued acute PT services to maximize functional mobility and independence prior to d/c with continued LTACH/SNF-level therapies.   Pt seen on vent at 40% FiO2, peep 5, SpO2 88% Supine BP 92/76, post-mobility BP 125/73    Follow Up Recommendations LTACH;SNF;Supervision/Assistance - 24 hour    Equipment Recommendations  (defer)    Recommendations for Other Services       Precautions / Restrictions Precautions Precautions: Fall Precaution Comments: vent/trach Restrictions Weight Bearing Restrictions: No      Mobility  Bed Mobility Overal bed mobility: Needs Assistance Bed Mobility: Supine to Sit;Sit to Supine     Supine to sit: Mod assist;+2 for safety/equipment Sit to  supine: Mod assist;+2 for safety/equipment   General bed mobility comments: Pt telling therapist to get out of the way; A to lift legs onto bed  Transfers                 General transfer comment: pt declined  Ambulation/Gait                Stairs            Wheelchair Mobility    Modified Rankin (Stroke Patients Only)       Balance Overall balance assessment: Needs assistance Sitting-balance support: Single extremity supported;Bilateral upper extremity supported Sitting balance-Leahy Scale: Poor Sitting balance - Comments: When cued to lean forward, pt able to maintain static sitting balance well, easily fatigued (?); up to mod-maxA with L lateral and posterior lean                                     Pertinent Vitals/Pain Pain Assessment: Faces Faces Pain Scale: Hurts whole lot Pain Location: back?; legs Pain Descriptors / Indicators: Grimacing;Guarding Pain Intervention(s): Limited activity within patient's tolerance;Repositioned    Home Living Family/patient expects to be discharged to:: Skilled nursing facility                 Additional Comments: From [facility] LTACH    Prior Function Level of Independence: Needs assistance         Comments: unsure what pt was doing at [place] Hand: Right    Extremity/Trunk Assessment   Upper Extremity Assessment Upper Extremity  Assessment: Generalized weakness    Lower Extremity Assessment Lower Extremity Assessment: Generalized weakness    Cervical / Trunk Assessment Cervical / Trunk Assessment: Kyphotic;Other exceptions Cervical / Trunk Exceptions: L lateral lean, forward head  Communication   Communication: Tracheostomy(vent)  Cognition Arousal/Alertness: Awake/alert Behavior During Therapy: Agitated;Impulsive Overall Cognitive Status: Impaired/Different from baseline Area of Impairment: Attention;Safety/judgement;Awareness;Problem  solving;Following commands                   Current Attention Level: Sustained;Selective   Following Commands: Follows one step commands inconsistently Safety/Judgement: Decreased awareness of deficits;Decreased awareness of safety Awareness: Intellectual Problem Solving: Slow processing;Requires verbal cues General Comments: Difficulty understanding pt while mouthing. Pt apparently became frustrated. Attempted to have pt communicate by writing however pt would not use the white board. Attempts made to explain what was happening during session to help with agitation      General Comments      Exercises Other Exercises Other Exercises: BLE SLR, heel slides   Assessment/Plan    PT Assessment Patient needs continued PT services  PT Problem List Decreased strength;Decreased activity tolerance;Decreased balance;Decreased mobility;Decreased cognition;Decreased knowledge of use of DME;Cardiopulmonary status limiting activity;Pain       PT Treatment Interventions DME instruction;Gait training;Functional mobility training;Therapeutic activities;Therapeutic exercise;Balance training;Cognitive remediation;Patient/family education    PT Goals (Current goals can be found in the Care Plan section)  Acute Rehab PT Goals Patient Stated Goal: none stated PT Goal Formulation: With patient    Frequency Min 2X/week   Barriers to discharge        Co-evaluation PT/OT/SLP Co-Evaluation/Treatment: Yes Reason for Co-Treatment: Complexity of the patient's impairments (multi-system involvement);Necessary to address cognition/behavior during functional activity;For patient/therapist safety;To address functional/ADL transfers PT goals addressed during session: Mobility/safety with mobility;Balance OT goals addressed during session: ADL's and self-care;Strengthening/ROM       AM-PAC PT "6 Clicks" Mobility  Outcome Measure Help needed turning from your back to your side while in a flat bed  without using bedrails?: A Lot Help needed moving from lying on your back to sitting on the side of a flat bed without using bedrails?: A Lot Help needed moving to and from a bed to a chair (including a wheelchair)?: A Lot Help needed standing up from a chair using your arms (e.g., wheelchair or bedside chair)?: A Lot Help needed to walk in hospital room?: A Lot Help needed climbing 3-5 steps with a railing? : Total 6 Click Score: 11    End of Session Equipment Utilized During Treatment: Oxygen Activity Tolerance: Patient tolerated treatment well;Treatment limited secondary to agitation Patient left: in bed;with call bell/phone within reach;with nursing/sitter in room Nurse Communication: Mobility status PT Visit Diagnosis: Other abnormalities of gait and mobility (R26.89);Muscle weakness (generalized) (M62.81)    Time: 5427-0623 PT Time Calculation (min) (ACUTE ONLY): 33 min   Charges:   PT Evaluation $PT Eval High Complexity: 1 High        Sergio Mitchell, PT, DPT Acute Rehabilitation Services  Pager [phone] Office [phone]  Malachy Chamber 09/15/2019, 1:30 PM

## 2019-09-15 NOTE — Progress Notes (Signed)
Assisted tele visit to patient with family member.  Moncerrat Burnstein Anderson, RN   

## 2019-09-15 NOTE — Discharge Instructions (Signed)

## 2019-09-15 NOTE — TOC Transition Note (Signed)
Transition of Care [facility]) - CM/SW Discharge Note   Patient Details  Name: RIVER AMBROSIO MRN: [id number] Date of Birth: 1951-11-07  Transition of Care [facility]) CM/SW Contact:  Weston Anna, LCSW Phone Number: 09/15/2019, 2:01 PM   Clinical Narrative:     Patient set to discharge to [facility] today- please call report to [phone]. Accepting MD: Satira Sark. Patient will go to room number 5E23.   CSW notified sister, Fraser Din. [facility] scheduled for pickup for 3:30PM. RN and Charge notified.   Final next level of care: Long Term Acute Care (LTAC) Barriers to Discharge: No Barriers Identified   Patient Goals and CMS Choice        Discharge Placement              Patient chooses bed at: Other - please specify in the comment section below:([facility]) Patient to be transferred to facility by: [place] Name of family member notified: Pat Patient and family notified of of transfer: 09/15/19  Discharge Plan and Services                                     Social Determinants of Health (SDOH) Interventions     Readmission Risk Interventions No flowsheet data found.

## 2019-09-15 NOTE — Progress Notes (Addendum)
Followed up with [facility] about the status of Sergio Mitchell' transport. He is on their pre-planned transport list, but there are other calls ahead of him and one of their vehicles is down, so it will be some time before they can get him. Will call them again later tonight to follow up.

## 2019-09-15 NOTE — Progress Notes (Addendum)
Occupational Therapy Evaluation Patient Details Name: Sergio Mitchell MRN: [id number] DOB: 1951-05-27 Today's Date: 09/15/2019    History of Present Illness 68 year old with a history of 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  He was originally admitted to the [facility] in [place] on May 25 after he presented with chest pain and shortness of breath.  He was found to be in cardiogenic shock with an EF of 20% and then developed septic shock related to Haemophilus influenza pneumonia.  Following his prolonged acute hospitalization he was transferred to [facility] on July 15.  He was transferred from [facility] to the [facility] ED 10/10 with persistent hypoxia in the setting of COVID-19.  He had a positive Covid test 10/7 while at [facility].   Clinical Impression   Pt admitted from SNF. Unsure of PLOF at [facility]. Pt seen on vent-Peep 5; 40% FiO2;CPAP. SpO2 88; BP supine 92/76; end of session 125/73. Attempted to progress pt OOB to chair, however pt only agreeable to sitting EOB. Sat EOB @ 15 min with Mod A due to L lateral and posterior lean. Pt not wanting therapist to touch his back although poor awareness of inability to maintain sitting balance. Attempted grooming tasks sitting EOB however pt not participating. Attempted exercise sitting EOB however pt with minimal participation. Mod A +2 for bed mobility. Will need Maximove to progress OOB. DC dispoition will be dependent on vent status; likely need vent SNF. Will follow acutely.     Follow Up Recommendations  SNF;LTACH(vent SNF/LTACH)    Equipment Recommendations  Other (comment)(TBA)    Recommendations for Other Services       Precautions / Restrictions Precautions Precautions: Fall Precaution Comments: vent      Mobility Bed Mobility Overal bed mobility: Needs Assistance Bed Mobility: Supine to Sit;Sit to Supine     Supine to sit: Mod assist;+2 for  safety/equipment Sit to supine: Mod assist;+2 for safety/equipment   General bed mobility comments: Pt telling therapist to get out of the way; A to lift legs onto bed  Transfers                 General transfer comment: pt declined    Balance                                           ADL either performed or assessed with clinical judgement   ADL Overall ADL's : Needs assistance/impaired   Eating/Feeding Details (indicate cue type and reason): pt aksing for water Grooming: Moderate assistance;Bed level   Upper Body Bathing: Moderate assistance;Bed level   Lower Body Bathing: Maximal assistance;Bed level   Upper Body Dressing : Maximal assistance;Bed level   Lower Body Dressing: Total assistance               Functional mobility during ADLs: Moderate assistance;+2 for safety/equipment(bed mobility)       Vision   Additional Comments: unsure     Perception     Praxis      Pertinent Vitals/Pain Pain Assessment: Faces Faces Pain Scale: Hurts whole lot Pain Location: back?; legs Pain Descriptors / Indicators: Grimacing;Guarding Pain Intervention(s): Limited activity within patient's tolerance;Repositioned     Hand Dominance Right   Extremity/Trunk Assessment Upper Extremity Assessment Upper Extremity Assessment: Generalized weakness   Lower Extremity Assessment Lower Extremity Assessment: Defer to PT evaluation  Cervical / Trunk Assessment Cervical / Trunk Assessment: Kyphotic;Other exceptions(forward head ) Cervical / Trunk Exceptions: L lateral lean   Communication Communication Communication: Tracheostomy(vent)   Cognition Arousal/Alertness: Awake/alert Behavior During Therapy: Agitated;Impulsive Overall Cognitive Status: Impaired/Different from baseline Area of Impairment: Attention;Safety/judgement;Awareness;Problem solving;Following commands                       Following Commands: (follows commands when  he wants to follow commands)   Awareness: Intellectual Problem Solving: Slow processing General Comments: Difficulty understanding pt while mouthing. Pt apparently became frustrated. Attempted to have pt communicate by writing however pt would not use the white board. Attempts made to explain what wa happening during session to help with agitation   General Comments       Exercises     Shoulder Instructions      Home Living Family/patient expects to be discharged to:: Skilled nursing facility                                        Prior Functioning/Environment Level of Independence: Needs assistance        Comments: unsure what pt was doing at [facility]        OT Problem List: Decreased strength;Decreased activity tolerance;Impaired balance (sitting and/or standing);Decreased coordination;Decreased cognition;Decreased safety awareness;Cardiopulmonary status limiting activity;Impaired tone;Pain      OT Treatment/Interventions: Self-care/ADL training;Therapeutic exercise;Neuromuscular education;DME and/or AE instruction;Therapeutic activities;Cognitive remediation/compensation;Patient/family education;Balance training    OT Goals(Current goals can be found in the care plan section) Acute Rehab OT Goals Patient Stated Goal: none stated OT Goal Formulation: Patient unable to participate in goal setting Time For Goal Achievement: 09/29/19 Potential to Achieve Goals: Good ADL Goals Pt Will Perform Grooming: with set-up;sitting;with supervision Pt Will Perform Upper Body Bathing: with supervision;with set-up;sitting Pt/caregiver will Perform Home Exercise Program: Both right and left upper extremity;With minimal assist;With theraband;Increased strength Additional ADL Goal #1: Pt will maintain midline postural control EOB with minguard A in preparation for ADL tasks  OT Frequency: Min 2X/week   Barriers to D/C:            Co-evaluation PT/OT/SLP  Co-Evaluation/Treatment: Yes Reason for Co-Treatment: Complexity of the patient's impairments (multi-system involvement);For patient/therapist safety;To address functional/ADL transfers;Necessary to address cognition/behavior during functional activity   OT goals addressed during session: ADL's and self-care;Strengthening/ROM      AM-PAC OT "6 Clicks" Daily Activity     Outcome Measure Help from another person eating meals?: Total Help from another person taking care of personal grooming?: A Lot Help from another person toileting, which includes using toliet, bedpan, or urinal?: Total Help from another person bathing (including washing, rinsing, drying)?: A Lot Help from another person to put on and taking off regular upper body clothing?: A Lot Help from another person to put on and taking off regular lower body clothing?: Total 6 Click Score: 9   End of Session Equipment Utilized During Treatment: Other (comment)(vent) Nurse Communication: Mobility status;Need for lift equipment(Maximove)  Activity Tolerance: Patient tolerated treatment well;Treatment limited secondary to agitation Patient left: in bed;with call bell/phone within reach;Other (comment)(chair)  OT Visit Diagnosis: Other abnormalities of gait and mobility (R26.89);Muscle weakness (generalized) (M62.81);Other symptoms and signs involving cognitive function;Pain Pain - part of body: (back)                Time: 9735-3299 OT Time Calculation (min): 35 min Charges:  OT General Charges $OT Visit: 1 Visit OT Evaluation $OT Eval High Complexity: 1 High  Kellar Westberg, OT/L   Acute OT Clinical Specialist Acute Rehabilitation Services Pager [phone] Office [phone]   [facility] IncWARD,HILLARY 09/15/2019, 11:16 AM

## 2019-09-15 NOTE — Progress Notes (Signed)
Patient agitated, refusing to let  this RN change his sheets or wash his back, refusing some med's. Patient will not answer when RN asks him what's wrong.

## 2019-09-15 NOTE — Discharge Summary (Signed)
DISCHARGE SUMMARY  Sergio Mitchell  MR#: [id number]  DOB:[birth year]  Date of Admission: 09/02/2019 Date of Discharge: 09/15/2019  Attending Physician:Sergio Fails Hennie Duos, MD  Patient's WUJ:[id number], [name], MD  Consults:  Disposition: D/C to [facility]  Follow-up Appts: Follow-up Information    Sergio Marble, MD Follow up.   Specialty: Internal Medicine Why: Arrange for a follow visit after you are released from [facility] information: [place] [address] [phone]           Tests Needing Follow-up: -Follow potassium -Assess oral intake and determine if supplemental tube feeding is necessary -Aggressively pursue physical and occupational therapy -Monitor cardiac rhythm to determine if atrial fibrillation is a chronic paroxysmal issue -Monitor daily weights and ins and outs -Monitor early stage sacral and heel wounds  Discharge Diagnoses: Acute on chronic hypoxic and hypercapnic respiratory failure Tracheostomy dependent COVID pneumonia Chronic restrictive lung 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  He was originally admitted to the [facility] in [place], [place] on May 25 after he presented with chest pain and shortness of breath.  He was found to be in cardiogenic shock with an EF of 20% and then developed septic shock related to Haemophilus influenza pneumonia.  Following his prolonged acute hospitalization he was transferred to [facility] vent SNF on July 15. He had a positive Covid test 10/7 while at [facility]. He was [facility] from [facility] ED 10/10  with persistent hypoxia in the setting of COVID-19.    COVID-19 specific Treatment: Solu-Medrol 10/10 > 10/20 Remdesivir 10/10 > 10/14 Convalescent plasma 10/10  Hospital Course:  Acute on chronic hypoxic and hypercapnic respiratory failure - tracheostomy dependent - COVID pneumonia Tracheostomy placed May 2020 - ventilator management per PCCM during admission at [facility] -pressure support weaning trials were performed daily during the latter portion of his stay at [facility] but patient has now been vent dependent for prolonged period of time so it is anticipated this will be a prolonged endeavor -continue to arrest patient at night on ventilator -ambulation and therapy felt to be key to this process -transfer to [facility] now arranged to afford the patient the best possibility of weaning and physical rehabilitation -as prior of note the patient did have a negative follow-up Covid test on 09/05/2019  Chronic restrictive lung 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  Chronic systolic CHF Chronically on Lasix 40 mg daily - net negative 1700 cc last 24 hours and net -11.5L since admit - no gross overload at time of d/c - continue maintenance lasix dosing - follow daily weights and Is/Os  Atrial fibrillation NSR maintained throughout this hospital stay - does not appear to be on chronic anticoagulation -unclear if this is a chronic persistent issue or a transient issue that was encountered during a recent hospital stay - recommend continued monitoring to determine if prophylactic anticoagulation is indicated - is chronically on 81 mg aspirin and Coreg which were continued during his hospital stay  Hypokalemia Due to limited intake and use of diuretic - continue to supplement to goal of 4.0 with scheduled dosing  Hypophosphatemia Corrected with supplementation  Stage I sacral decubitus ulcer - R heel deep tissue injury  Continue preventive  measures to attempt to avoid progression of these wounds   Pressure Injury 09/02/19 Buttocks (Active)  09/02/19 1600  Location: Buttocks  Location Orientation:   Staging:   Wound Description (Comments):   Present on Admission:      Pressure Injury 09/03/19 Heel Right Deep Tissue Injury - Purple or maroon localized area of discolored intact skin or blood-filled blister due to damage of underlying soft tissue from pressure and/or shear. (Active)  09/03/19 0830  Location: Heel  Location Orientation: Right  Staging: Deep Tissue Injury - Purple or maroon localized area of discolored intact skin or blood-filled blister due to damage of underlying soft tissue from pressure and/or shear.  Wound Description (Comments):   Present on Admission:     Toxic metabolic encephalopathy Related to acute worsening of hypoxic failure -resolved with active care -mental status intact at time of discharge  Depression/anxiety Well compensated at time of transfer  Allergies as of 09/15/2019      Reactions   Bacitracin Other (See Comments)   Per MAR   Cefdinir Other (See Comments)   Per MAR   Lidocaine Other (See Comments)   Per MAR   Neomycin Other (See Comments)   Per MAR   Neosporin Af [miconazole] Other (See Comments)   Unknown 9.7.2020 Per [facility] MAR pt is currently using Miconazole Topical [place]   Pantoprazole Other (See Comments)   unknown   Penicillins Other (See Comments)   Per MAR   Polymyxin B Other (See Comments)   Per MAR   Pramoxine Other (See Comments)   Per MAR      Medication List    STOP taking these medications   acetaminophen 325 MG tablet Commonly known as: TYLENOL Replaced by: acetaminophen 160 MG/5ML solution   albuterol (2.5 MG/3ML) 0.083% nebulizer solution Commonly known as: PROVENTIL   Antifungal 2 % powder Generic drug: miconazole   chlorhexidine gluconate (MEDLINE KIT) 0.12 % solution Commonly known as: PERIDEX   heparin 5000 UNIT/ML injection   Isosource 1.5 Cal Liqd   lansoprazole 30 MG capsule  Commonly known as: PREVACID   predniSONE 10 MG tablet Commonly known as: DELTASONE   Vitamin D3 125 MCG (5000 UT) Caps Replaced by: Vitamin D3 25 MCG tablet   zinc gluconate 50 MG tablet Replaced by: zinc sulfate 220 (50 Zn) MG capsule     TAKE these medications   acetaminophen 160 MG/5ML solution Commonly known as: TYLENOL Take 20.3 mLs (650 mg total) by mouth every 6 (six) hours as needed for mild pain or headache (fever > 101). Replaces: acetaminophen 325 MG tablet   aspirin 81 MG chewable tablet Chew 1 tablet (81 mg total) by mouth daily. Start taking on: September 16, 2019 What changed: how to take this   atorvastatin 20 MG tablet Commonly known as: LIPITOR Take 1 tablet (20 mg total) by mouth daily. Start taking on: September 16, 2019 What changed: how to take this   bisacodyl 5 MG EC tablet Commonly known as: DULCOLAX Take 1 tablet (5 mg total) by mouth daily as needed for moderate constipation.   carvedilol 12.5 MG tablet Commonly known as: COREG Take 1 tablet (12.5 mg total) by mouth 2 (two) times daily with a meal. What changed:   how to take this  when to take this   clonazePAM 1 MG tablet Commonly known as: KLONOPIN Take 1 tablet (1 mg total) by mouth every 8 (eight) hours. What changed: how to take this   docusate sodium 100 MG capsule  Commonly known as: COLACE Take 1 capsule (100 mg total) by mouth daily. Start taking on: September 16, 2019 What changed:   how much to take  additional instructions   enoxaparin 40 MG/0.4ML injection Commonly known as: LOVENOX Inject 0.4 mLs (40 mg total) into the skin daily.   escitalopram 10 MG tablet Commonly known as: LEXAPRO Take 1 tablet (10 mg total) by mouth daily. Start taking on: September 16, 2019 What changed: how to take this   famotidine 20 MG tablet Commonly known as: PEPCID Take 1 tablet (20 mg total) by mouth at bedtime.   feeding supplement (PRO-STAT SUGAR FREE 64) Liqd Place 60 mLs into  feeding tube 2 (two) times daily.   furosemide 40 MG tablet Commonly known as: LASIX Take 1 tablet (40 mg total) by mouth daily. Start taking on: September 16, 2019 What changed: how to take this   insulin aspart 100 UNIT/ML injection Commonly known as: novoLOG Inject 0-5 Units into the skin at bedtime.   insulin aspart 100 UNIT/ML injection Commonly known as: novoLOG Inject 0-9 Units into the skin 3 (three) times daily with meals.   ipratropium-albuterol 0.5-2.5 (3) MG/3ML Soln Commonly known as: DUONEB Take 3 mLs by nebulization every 4 (four) hours as needed. What changed:   when to take this  reasons to take this   ipratropium-albuterol 0.5-2.5 (3) MG/3ML Soln Commonly known as: DUONEB Take 3 mLs by nebulization every 6 (six) hours. What changed: You were already taking a medication with the same name, and this prescription was added. Make sure you understand how and when to take each.   morphine 15 MG tablet Commonly known as: MSIR Take 1 tablet (15 mg total) by mouth every 8 (eight) hours. What changed: how to take this   ondansetron 4 MG tablet Commonly known as: ZOFRAN Place 4 mg into feeding tube every 8 (eight) hours as needed for nausea or vomiting. What changed: Another medication with the same name was added. Make sure you understand how and when to take each.   ondansetron 4 MG/2ML Soln injection Commonly known as: ZOFRAN Inject 2 mLs (4 mg total) into the vein every 6 (six) hours as needed for nausea. What changed: You were already taking a medication with the same name, and this prescription was added. Make sure you understand how and when to take each.   oxyCODONE 5 MG immediate release tablet Commonly known as: Oxy IR/ROXICODONE Take 1 tablet (5 mg total) by mouth every 4 (four) hours as needed for moderate pain.   polyethylene glycol 17 g packet Commonly known as: MiraLax Take 17 g by mouth daily. Start taking on: September 16, 2019 What changed:  how to take this   potassium chloride 20 MEQ/15ML (10%) Soln Take 30 mLs (40 mEq total) by mouth 2 (two) times daily.   QUEtiapine 50 MG tablet Commonly known as: SEROQUEL Take 1 tablet (50 mg total) by mouth every 12 (twelve) hours as needed (increased anxiousness/behavior). What changed: how to take this   vitamin C 500 MG tablet Commonly known as: ASCORBIC ACID Take 500 mg by mouth daily.   Vitamin D3 25 MCG tablet Commonly known as: Vitamin D Take 2 tablets (2,000 Units total) by mouth daily. Start taking on: September 16, 2019 Replaces: Vitamin D3 125 MCG (5000 UT) Caps   zinc sulfate 220 (50 Zn) MG capsule Take 1 capsule (220 mg total) by mouth daily. Start taking on: September 16, 2019 Replaces: zinc gluconate 50 MG tablet  zolpidem 5 MG tablet Commonly known as: AMBIEN Take 1 tablet (5 mg total) by mouth at bedtime as needed for sleep.       Day of Discharge BP 116/67   Pulse 81   Temp 98.6 F (37 C) (Oral)   Resp (!) 24   Ht '6\' 2"'  (1.88 m)   Wt 106.6 kg   SpO2 (!) 89%   BMI 30.17 kg/m   Physical Exam: General: No acute respiratory distress on vent via trach  Lungs: Faint bibasilar crackles with no wheezing Cardiovascular: Regular rate and rhythm without murmur  Abdomen: Overweight, soft, bowel sounds positive, no rebound Extremities: Trace bilateral lower extremity edema without cyanosis or clubbing  Basic Metabolic Panel: Recent Labs  Lab 09/10/19 0557 09/11/19 0540 09/12/19 0520 09/13/19 0535 09/14/19 0520 09/15/19 0711  NA 136 133* 136 135 139 140  K 3.5 3.3* 3.7 3.2* 3.5 3.2*  CL 94* 92* 96* 93* 96* 99  CO2 '30 28 28 ' 32 33* 31  GLUCOSE 137* 143* 144* 112* 95 101*  BUN 63* 60* 49* 48* 46* 43*  CREATININE 0.95 0.96 0.84 0.79 0.83 0.85  CALCIUM 9.8 9.7 9.4 9.2 9.2 9.2  MG 2.3 2.2 2.2 2.0 2.1  --   PHOS 3.9 3.0 2.7 2.0* 2.7 2.6    Liver Function Tests: Recent Labs  Lab 09/11/19 0540 09/12/19 0520 09/13/19 0535 09/14/19 0520 09/15/19  0711  AST '24 21 21 25 21  ' ALT 32 30 33 41 35  ALKPHOS 33* 31* 29* 40 43  BILITOT 0.5 0.3 0.5 0.8 0.7  PROT 6.0* 5.7* 5.4* 5.6* 5.6*  ALBUMIN 3.0* 2.7* 2.6* 2.7* 2.7*   Coags: Recent Labs  Lab 09/11/19 0540 09/12/19 0520 09/13/19 0535 09/14/19 0520 09/15/19 0711  INR 1.1 1.1 1.1 1.1 1.1   CBC: Recent Labs  Lab 09/11/19 0540 09/12/19 0520 09/13/19 0535 09/14/19 0520 09/15/19 0711  WBC 18.3* 15.7* 16.5* 16.6* 13.8*  NEUTROABS 15.4* 13.2* 13.6* 12.6* 10.3*  HGB 11.9* 11.2* 11.1* 12.8* 12.5*  HCT 35.9* 33.1* 33.5* 39.6 39.0  MCV 94.7 94.3 95.4 97.5 97.5  PLT 396 375 356 321 315    CBG: Recent Labs  Lab 09/14/19 0903 09/14/19 1131 09/14/19 1726 09/14/19 2142 09/15/19 0845  GLUCAP 99 141* 105* 156* 109*    Recent Results (from the past 240 hour(s))  Novel Coronavirus, NAA (Hosp order, Send-out to Ref Lab; TAT 18-24 hrs     Status: None   Collection Time: 09/05/19  1:35 PM   Specimen: Nasopharyngeal Swab; Respiratory  Result Value Ref Range Status   SARS-CoV-2, NAA NOT DETECTED NOT DETECTED Final    Comment: (NOTE) This nucleic acid amplification test was developed and its performance characteristics determined by [employer]. Nucleic acid amplification tests include PCR and TMA. This test has not been FDA cleared or approved. This test has been authorized by FDA under an Emergency Use Authorization (EUA). This test is only authorized for the duration of time the declaration that circumstances exist justifying the authorization of the emergency use of in vitro diagnostic tests for detection of SARS-CoV-2 virus and/or diagnosis of COVID-19 infection under section 564(b)(1) of the Act, 21 U.S.C. 681PTE-7(M) (1), unless the authorization is terminated or revoked sooner. When diagnostic testing is negative, the possibility of a false negative result should be considered in the context of a patient's recent exposures and the presence of clinical signs and  symptoms consistent with COVID-19. An individual without symptoms of COVID- 19 and who is  not shedding SARS-CoV-2 vi rus would expect to have a negative (not detected) result in this assay. Performed At: [facility] [place], [place] [id number] Rush Farmer MD KA:[id number]    [place]  Final    Comment: Performed at [facility] [address] [place], [place] [address]     Time spent in discharge (includes decision making & examination of pt): 35 minutes  09/15/2019, 12:14 PM   Cherene Altes, MD [facility] Office  [phone]

## 2019-09-15 NOTE — Progress Notes (Signed)
[facility] PCCM  Seen briefly on rounds Working with PT Weaning on ventilator Transferring to [facility] today  Roselie Awkward, MD [facility] PCCM Pager: [phone] Cell: [phone] If no response, call [phone]

## 2019-09-16 ENCOUNTER — Other Ambulatory Visit (HOSPITAL_COMMUNITY): Payer: Medicare Other

## 2019-09-16 ENCOUNTER — Encounter: Payer: Self-pay | Admitting: Internal Medicine

## 2019-09-16 ENCOUNTER — Inpatient Hospital Stay (HOSPITAL_COMMUNITY)
Admission: RE | Admit: 2019-09-16 | Discharge: 2019-10-17 | Disposition: A | Discharge status: Still In Hospital | Payer: Medicare Other | Source: Home / Self Care | Attending: Anesthesiology | Admitting: Anesthesiology

## 2019-09-16 DIAGNOSIS — K567 Ileus, unspecified: Secondary | ICD-10-CM

## 2019-09-16 DIAGNOSIS — R0902 Hypoxemia: Secondary | ICD-10-CM

## 2019-09-16 DIAGNOSIS — J9621 Acute and chronic respiratory failure with hypoxia: Secondary | ICD-10-CM | POA: Diagnosis not present

## 2019-09-16 DIAGNOSIS — J1289 Other viral pneumonia: Secondary | ICD-10-CM | POA: Diagnosis present

## 2019-09-16 DIAGNOSIS — J9601 Acute respiratory failure with hypoxia: Secondary | ICD-10-CM

## 2019-09-16 DIAGNOSIS — R509 Fever, unspecified: Secondary | ICD-10-CM

## 2019-09-16 DIAGNOSIS — U071 COVID-19: Secondary | ICD-10-CM | POA: Diagnosis not present

## 2019-09-16 DIAGNOSIS — I5022 Chronic systolic (congestive) heart failure: Secondary | ICD-10-CM | POA: Diagnosis present

## 2019-09-16 DIAGNOSIS — A413 Sepsis due to Hemophilus influenzae: Secondary | ICD-10-CM | POA: Diagnosis present

## 2019-09-16 DIAGNOSIS — R6521 Severe sepsis with septic shock: Secondary | ICD-10-CM

## 2019-09-16 DIAGNOSIS — J189 Pneumonia, unspecified organism: Secondary | ICD-10-CM

## 2019-09-16 DIAGNOSIS — J1282 Pneumonia due to coronavirus disease 2019: Secondary | ICD-10-CM | POA: Diagnosis present

## 2019-09-16 DIAGNOSIS — I482 Chronic atrial fibrillation, unspecified: Secondary | ICD-10-CM | POA: Diagnosis not present

## 2019-09-16 DIAGNOSIS — J398 Other specified diseases of upper respiratory tract: Secondary | ICD-10-CM

## 2019-09-16 HISTORY — DX: COVID-19: U07.1

## 2019-09-16 HISTORY — DX: Severe sepsis with septic shock: R65.21

## 2019-09-16 HISTORY — DX: Severe sepsis with septic shock: J96.01

## 2019-09-16 HISTORY — DX: Chronic atrial fibrillation, unspecified: I48.20

## 2019-09-16 HISTORY — DX: Pneumonia due to coronavirus disease 2019: J12.82

## 2019-09-16 HISTORY — DX: Chronic systolic (congestive) heart failure: I50.22

## 2019-09-16 HISTORY — DX: Acute and chronic respiratory failure with hypoxia: J96.21

## 2019-09-16 HISTORY — DX: Severe sepsis with septic shock: A41.3

## 2019-09-16 LAB — COMPREHENSIVE METABOLIC PANEL
ALT: 30 U/L (ref 0–44)
ALT: 25 U/L (ref 0–44)
AST: 18 U/L (ref 15–41)
AST: 26 U/L (ref 15–41)
Albumin: 2.8 g/dL — ABNORMAL LOW (ref 3.5–5.0)
Albumin: 2.5 g/dL — ABNORMAL LOW (ref 3.5–5.0)
Alkaline Phosphatase: 45 U/L (ref 38–126)
Alkaline Phosphatase: 47 U/L (ref 38–126)
Anion gap: 10 (ref 5–15)
Anion gap: 10 (ref 5–15)
BUN: 40 mg/dL — ABNORMAL HIGH (ref 8–23)
BUN: 37 mg/dL — ABNORMAL HIGH (ref 8–23)
CO2: 27 mmol/L (ref 22–32)
CO2: 27 mmol/L (ref 22–32)
Calcium: 9.1 mg/dL (ref 8.9–10.3)
Calcium: 9.3 mg/dL (ref 8.9–10.3)
Chloride: 101 mmol/L (ref 98–111)
Chloride: 103 mmol/L (ref 98–111)
Creatinine, Ser: 0.93 mg/dL (ref 0.61–1.24)
Creatinine, Ser: 1 mg/dL (ref 0.61–1.24)
GFR calc Af Amer: >60 mL/min (ref >60)
GFR calc Af Amer: >60 mL/min (ref >60)
GFR calc non Af Amer: >60 mL/min (ref >60)
GFR calc non Af Amer: >60 mL/min (ref >60)
Glucose, Bld: 95 mg/dL (ref 70–99)
Glucose, Bld: 98 mg/dL (ref 70–99)
Potassium: 3.8 mmol/L (ref 3.5–5.1)
Potassium: 3.8 mmol/L (ref 3.5–5.1)
Sodium: 138 mmol/L (ref 135–145)
Sodium: 140 mmol/L (ref 135–145)
Total Bilirubin: 1.7 mg/dL — ABNORMAL HIGH (ref 0.3–1.2)
Total Bilirubin: 1.5 mg/dL — ABNORMAL HIGH (ref 0.3–1.2)
Total Protein: 5.8 g/dL — ABNORMAL LOW (ref 6.5–8.1)
Total Protein: 5.5 g/dL — ABNORMAL LOW (ref 6.5–8.1)

## 2019-09-16 LAB — CBC WITH DIFFERENTIAL/PLATELET
Abs Immature Granulocytes: 0.11 10*3/uL — ABNORMAL HIGH (ref 0.00–0.07)
Abs Immature Granulocytes: 0.12 10*3/uL — ABNORMAL HIGH (ref 0.00–0.07)
Basophils Absolute: 0 10*3/uL (ref 0.0–0.1)
Basophils Absolute: 0 10*3/uL (ref 0.0–0.1)
Basophils Relative: 0 %
Basophils Relative: 0 %
Eosinophils Absolute: 0.4 10*3/uL (ref 0.0–0.5)
Eosinophils Absolute: 0.4 10*3/uL (ref 0.0–0.5)
Eosinophils Relative: 3 %
Eosinophils Relative: 3 %
HCT: 39.4 % (ref 39.0–52.0)
HCT: 38.4 % — ABNORMAL LOW (ref 39.0–52.0)
Hemoglobin: 12.7 g/dL — ABNORMAL LOW (ref 13.0–17.0)
Hemoglobin: 12.7 g/dL — ABNORMAL LOW (ref 13.0–17.0)
Immature Granulocytes: 1 %
Immature Granulocytes: 1 %
Lymphocytes Relative: 11 %
Lymphocytes Relative: 10 %
Lymphs Abs: 1.4 10*3/uL (ref 0.7–4.0)
Lymphs Abs: 1.2 10*3/uL (ref 0.7–4.0)
MCH: 31.4 pg (ref 26.0–34.0)
MCH: 31.5 pg (ref 26.0–34.0)
MCHC: 32.2 g/dL (ref 30.0–36.0)
MCHC: 33.1 g/dL (ref 30.0–36.0)
MCV: 97.5 fL (ref 80.0–100.0)
MCV: 95.3 fL (ref 80.0–100.0)
Monocytes Absolute: 1.1 10*3/uL — ABNORMAL HIGH (ref 0.1–1.0)
Monocytes Absolute: 1.3 10*3/uL — ABNORMAL HIGH (ref 0.1–1.0)
Monocytes Relative: 9 %
Monocytes Relative: 11 %
Neutro Abs: 9.5 10*3/uL — ABNORMAL HIGH (ref 1.7–7.7)
Neutro Abs: 9.2 10*3/uL — ABNORMAL HIGH (ref 1.7–7.7)
Neutrophils Relative %: 76 %
Neutrophils Relative %: 75 %
Platelets: 296 10*3/uL (ref 150–400)
Platelets: 304 10*3/uL (ref 150–400)
RBC: 4.04 MIL/uL — ABNORMAL LOW (ref 4.22–5.81)
RBC: 4.03 MIL/uL — ABNORMAL LOW (ref 4.22–5.81)
RDW: 13.9 % (ref 11.5–15.5)
RDW: 13.8 % (ref 11.5–15.5)
WBC: 12.5 10*3/uL — ABNORMAL HIGH (ref 4.0–10.5)
WBC: 12.1 10*3/uL — ABNORMAL HIGH (ref 4.0–10.5)
nRBC: 0 % (ref 0.0–0.2)
nRBC: 0 % (ref 0.0–0.2)

## 2019-09-16 LAB — PROTIME-INR
INR: 1.1 (ref 0.8–1.2)
Prothrombin Time: 13.6 seconds (ref 11.4–15.2)

## 2019-09-16 LAB — NOVEL CORONAVIRUS, NAA (HOSP ORDER, SEND-OUT TO REF LAB; TAT 18-24 HRS): SARS-CoV-2, NAA: NOT DETECTED

## 2019-09-16 LAB — PHOSPHORUS: Phosphorus: 3 mg/dL (ref 2.5–4.6)

## 2019-09-16 NOTE — Consult Note (Signed)
Pulmonary Critical Care Medicine [place]  PULMONARY SERVICE  Date of Service: 09/16/2019  PULMONARY CRITICAL CARE Sergio Mitchell  QMV:[id number]  DOB: December 13, 1950   DOA: 09/16/2019  Referring Physician: [name], MD  HPI: Sergio Mitchell is a 68 y.o. male seen for follow up of Acute on Chronic Respiratory Failure.  Patient has multiple medical problems including chronic respiratory failure tracheostomy dependent chronic systolic heart failure chronic atrial fibrillation who was a resident over at skilled nursing facility.  Patient apparently was diagnosed with COVID-19 and was transferred to: Health care for follow management.  Other medical issues include chronic congestive heart failure with an ejection fraction of 20 to 25% patient apparently developed a healthcare associated pneumonia and had a bronchoalveolar lavage positive for an office influenza.  Patient then was subsequently diagnosed discharged to [facility] skilled facility.  And he had been weaning on CPAP and T bar trials.  Subsequently was found to be COVID-19 positive.  Patient was been on the ventilator right now is on full support with assist control mode and is requiring about 50% FiO2 has been having periods of agitation which was reported at the other facility also  Review of Systems:  ROS performed and is unremarkable other than noted above.  Past Medical History:  Diagnosis Date  . DDD (degenerative disc disease), lumbar   . Fibromyositis   . Osteoporosis   . S/P percutaneous endoscopic gastrostomy (PEG) tube placement ([place])   . Scoliosis   . Spinal stenosis   . Systolic CHF, chronic ([place]) 04/2019   EF 20-25%, ACID recommended  . Tracheostomy tube present ([place])   . Ventilator dependence [facility])     Past Surgical History:  Procedure Laterality Date  . CHOLECYSTECTOMY    . JOINT REPLACEMENT    . REPLACEMENT TOTAL KNEE BILATERAL Bilateral   . TONSILECTOMY, ADENOIDECTOMY, BILATERAL  MYRINGOTOMY AND TUBES    . TRACHEOSTOMY      Social History:    reports that he has quit smoking. His smoking use included cigarettes. He has a 78.00 pack-year smoking history. He has never used smokeless tobacco. He reports that he does not drink alcohol or use drugs.  Family History: Non-Contributory to the present illness  Allergies  Allergen Reactions  . Bacitracin Other (See Comments)    Per [facility]  . Cefdinir Other (See Comments)    Per [facility]  . Lidocaine Other (See Comments)    Per [facility]  . Neomycin Other (See Comments)    Per [facility]  . Neosporin Af [Miconazole] Other (See Comments)    Unknown 9.7.2020 Per [facility] pt is currently using Miconazole Topical [place]  . Pantoprazole Other (See Comments)    unknown  . Penicillins Other (See Comments)    Per [facility]  . Polymyxin B Other (See Comments)    Per [facility]  . Pramoxine Other (See Comments)    Per [facility]    Medications: Reviewed on Rounds  Physical Exam:  Vitals: Temperature 98.2 pulse 70 respiratory rate 16 blood pressure 136/80 saturations 96%  Ventilator Settings mode ventilation assist control FiO2 50% tidal volume 500 PEEP 5  . General: Comfortable at this time . Eyes: Grossly normal lids, irises & conjunctiva . ENT: grossly tongue is normal . Neck: no obvious mass . Cardiovascular: S1-S2 normal no gallop or rub . Respiratory: Coarse breath sounds with a few rhonchi . Abdomen: Soft and nontender . Skin: no rash seen on limited exam . Musculoskeletal: not rigid .  Psychiatric:unable to assess . Neurologic: no seizure no involuntary movements         Labs on Admission:  Basic Metabolic Panel: Recent Labs  Lab 09/10/19 0557 09/11/19 0540 09/12/19 0520 09/13/19 0535 09/14/19 0520 09/15/19 0711 09/16/19 0435 09/16/19 1041  NA 136 133* 136 135 139 140 138 140  K 3.5 3.3* 3.7 3.2* 3.5 3.2* 3.8 3.8  CL 94* 92* 96* 93* 96* 99 101 103  CO2 _0 32 33* _1 GLUCOSE 137* 143* 144* 112* 95 101* 95 98   BUN 63* 60* 49* 48* 46* 43* 40* 37*  CREATININE 0.95 0.96 0.84 0.79 0.83 0.85 0.93 1.00  CALCIUM 9.8 9.7 9.4 9.2 9.2 9.2 9.1 9.3  MG 2.3 2.2 2.2 2.0 2.1  --   --   --   PHOS 3.9 3.0 2.7 2.0* 2.7 2.6 3.0  --     No results for input(s): PHART, PCO2ART, PO2ART, HCO3, O2SAT in the last 168 hours.  Liver Function Tests: Recent Labs  Lab 09/13/19 0535 09/14/19 0520 09/15/19 0711 09/16/19 0435 09/16/19 1041  AST _2 ALT 33 41 35 30 25  ALKPHOS 29* 40 43 45 47  BILITOT 0.5 0.8 0.7 1.7* 1.5*  PROT 5.4* 5.6* 5.6* 5.8* 5.5*  ALBUMIN 2.6* 2.7* 2.7* 2.8* 2.5*   No results for input(s): LIPASE, AMYLASE in the last 168 hours. No results for input(s): AMMONIA in the last 168 hours.  CBC: Recent Labs  Lab 09/13/19 0535 09/14/19 0520 09/15/19 0711 09/16/19 0435 09/16/19 1041  WBC 16.5* 16.6* 13.8* 12.5* 12.1*  NEUTROABS 13.6* 12.6* 10.3* 9.5* 9.2*  HGB 11.1* 12.8* 12.5* 12.7* 12.7*  HCT 33.5* 39.6 39.0 39.4 38.4*  MCV 95.4 97.5 97.5 97.5 95.3  PLT 356 321 315 296 304    Cardiac Enzymes: No results for input(s): CKTOTAL, CKMB, CKMBINDEX, TROPONINI in the last 168 hours.  BNP (last 3 results) No results for input(s): BNP in the last 8760 hours.  ProBNP (last 3 results) No results for input(s): PROBNP in the last 8760 hours.   Radiological Exams on Admission: Dg Chest Port 1 View  Result Date: 09/16/2019 CLINICAL DATA:  COVID 19. Pneumonia EXAM: PORTABLE CHEST 1 VIEW COMPARISON:  09/15/2019 FINDINGS: ETT tip is stable above the carina. Unchanged cardiac enlargement. There are bilateral pleural effusions, left greater than right. Unchanged appearance of left lower lobe airspace consolidation. IMPRESSION: 1. Stable left lower lobe airspace consolidation compatible with pneumonia. Electronically Signed   By: Kerby Moors M.D.   On: 09/16/2019 10:22   Dg Chest Port 1 View  Result Date: 09/15/2019 CLINICAL DATA:  Pneumonia due to COVID EXAM: PORTABLE CHEST 1 VIEW  COMPARISON:  09/12/2019 FINDINGS: Tracheostomy is unchanged. Bibasilar airspace opacities are again noted, left greater than right. Heart is borderline in size. No acute bony abnormality. IMPRESSION: Stable bibasilar atelectasis or infiltrates. Electronically Signed   By: Rolm Baptise M.D.   On: 09/15/2019 07:57   Vas [place] Lower Extremity Venous (dvt)  Result Date: 09/14/2019  Lower Venous Study Indications: COVID PACT TRIAL STUDY.  Comparison Study: No priors. Performing Technologist: Oda Cogan RDMS, RVT  Examination Guidelines: A complete evaluation includes B-mode imaging, spectral Doppler, color Doppler, and power Doppler as needed of all accessible portions of each vessel. Bilateral testing is considered an integral part of a complete examination. Limited examinations for reoccurring indications may be performed as noted.  +---------+---------------+---------+-----------+----------+--------------+ RIGHT    CompressibilityPhasicitySpontaneityPropertiesThrombus Aging +---------+---------------+---------+-----------+----------+--------------+ CFV  Full           Yes      Yes                                 +---------+---------------+---------+-----------+----------+--------------+ SFJ      Full                                                        +---------+---------------+---------+-----------+----------+--------------+ FV Prox  Full                                                        +---------+---------------+---------+-----------+----------+--------------+ FV Mid   Full                                                        +---------+---------------+---------+-----------+----------+--------------+ FV DistalFull                                                        +---------+---------------+---------+-----------+----------+--------------+ PFV      Full                                                         +---------+---------------+---------+-----------+----------+--------------+ POP      Full           Yes      Yes                                 +---------+---------------+---------+-----------+----------+--------------+ PTV      Full                                                        +---------+---------------+---------+-----------+----------+--------------+ PERO     Full                                                        +---------+---------------+---------+-----------+----------+--------------+   +---------+---------------+---------+-----------+----------+--------------+ LEFT     CompressibilityPhasicitySpontaneityPropertiesThrombus Aging +---------+---------------+---------+-----------+----------+--------------+ CFV      Full           Yes      Yes                                 +---------+---------------+---------+-----------+----------+--------------+  SFJ      Full                                                        +---------+---------------+---------+-----------+----------+--------------+ FV Prox  Full                                                        +---------+---------------+---------+-----------+----------+--------------+ FV Mid   Full                                                        +---------+---------------+---------+-----------+----------+--------------+ FV DistalFull                                                        +---------+---------------+---------+-----------+----------+--------------+ PFV      Full                                                        +---------+---------------+---------+-----------+----------+--------------+ POP      Partial        Yes      Yes                  Chronic        +---------+---------------+---------+-----------+----------+--------------+ PTV      Partial                                      Chronic         +---------+---------------+---------+-----------+----------+--------------+ PERO                                                  Not visualized +---------+---------------+---------+-----------+----------+--------------+ Small amount of chronic appearing thrombus in the popliteal and posterior tibial veins. Peroneal veins are not clearly visualized.   Summary: Right: There is no evidence of deep vein thrombosis in the lower extremity. Left: Findings consistent with chronic deep vein thrombosis involving the left popliteal vein, and left posterior tibial veins.  *See table(s) above for measurements and observations. Electronically signed by Deitra Mayo MD on 09/14/2019 at 5:08:24 PM.    Final     Assessment/Plan Active Problems:   Acute on chronic respiratory failure with hypoxia ([place])   COVID-19 virus infection   Pneumonia due to COVID-19 virus   Chronic systolic heart failure ([place])   Sepsis due to Haemophilus influenzae with acute hypoxic respiratory failure and septic shock (H[facility])   Chronic atrial fibrillation ([place])   1. Acute on chronic respiratory  failure with hypoxia patient at this time is on full support on assist control mode respiratory therapy reports that periodic agitation is limiting the ability to wean the patient. 2. COVID-19 virus infection now in recovery phase we will continue with current management and supportive care. 3. Chronic systolic heart failure last ejection fraction 20 to 25% by echocardiogram. 4. Sepsis with shock Haemophilus influenzae pneumonia patient has been treated with broad-spectrum antibiotics we will continue with supportive care. 5. Chronic atrial fibrillation currently rate controlled we will follow 6. COVID-19 pneumonia treated continue to follow radiologically prognosis guarded  I have personally seen and evaluated the patient, evaluated laboratory and imaging results, formulated the assessment and plan and placed orders. The Patient  requires high complexity decision making for assessment and support.  Case was discussed on Rounds with the Respiratory Therapy Staff Time Spent 70mnutes  SAllyne Gee MD FBrass Partnership In Comm[facility]ba Brass Surgery CenterPulmonary Critical Care Medicine Sleep Medicine

## 2019-09-16 NOTE — Progress Notes (Signed)
0200- Patient continues to refuse this RN from washing his back and changing his sheets. 0400- Refusing linen change and repositioning.0450- Patient discharged to [facility] of [facility]. Patient denies any distress, VSS. Patient placed on [facility] transport vent and monitor. Call to speciality select to notify patient leaving [facility] at this time.   Marland Kitchen

## 2019-09-17 ENCOUNTER — Other Ambulatory Visit (HOSPITAL_COMMUNITY): Payer: Medicare Other

## 2019-09-17 DIAGNOSIS — J9621 Acute and chronic respiratory failure with hypoxia: Secondary | ICD-10-CM | POA: Diagnosis not present

## 2019-09-17 DIAGNOSIS — I482 Chronic atrial fibrillation, unspecified: Secondary | ICD-10-CM | POA: Diagnosis not present

## 2019-09-17 DIAGNOSIS — J398 Other specified diseases of upper respiratory tract: Secondary | ICD-10-CM

## 2019-09-17 DIAGNOSIS — I5022 Chronic systolic (congestive) heart failure: Secondary | ICD-10-CM | POA: Diagnosis not present

## 2019-09-17 DIAGNOSIS — U071 COVID-19: Secondary | ICD-10-CM | POA: Diagnosis not present

## 2019-09-17 LAB — BLOOD GAS, ARTERIAL
Acid-Base Excess: 6.3 mmol/L — ABNORMAL HIGH (ref 0.0–2.0)
Bicarbonate: 30.1 mmol/L — ABNORMAL HIGH (ref 20.0–28.0)
FIO2: 45
MECHVT: 500 mL
O2 Saturation: 95.5 %
PEEP: 5 cmH2O
Patient temperature: 98.6
RATE: 24 resp/min
pCO2 arterial: 41.3 mmHg (ref 32.0–48.0)
pH, Arterial: 7.475 — ABNORMAL HIGH (ref 7.350–7.450)
pO2, Arterial: 76.7 mmHg — ABNORMAL LOW (ref 83.0–108.0)

## 2019-09-17 NOTE — Progress Notes (Signed)
Pulmonary Critical Care Medicine [place]   PULMONARY CRITICAL CARE SERVICE  PROGRESS NOTE  Date of Service: 09/17/2019  Sergio Mitchell  YSA:[id number]  DOB: 13-Sep-1951   DOA: 09/16/2019  Referring Physician: [name], MD  HPI: Sergio Mitchell is a 68 y.o. male seen for follow up of Acute on Chronic Respiratory Failure.  Patient remains on the ventilator and full support.  Has been having some issues with desaturations however this morning saturations were good  Medications: Reviewed on Rounds  Physical Exam:  Vitals: Temperature 98.5 pulse 86 respiratory rate 12 blood pressure 114/78 saturations 100%  Ventilator Settings mode ventilation assist control FiO2 40% tidal volume 500 PEEP 5  . General: Comfortable at this time . Eyes: Grossly normal lids, irises & conjunctiva . ENT: grossly tongue is normal . Neck: no obvious mass . Cardiovascular: S1 S2 normal no gallop . Respiratory: No rhonchi no rales are noted at this time . Abdomen: soft . Skin: no rash seen on limited exam . Musculoskeletal: not rigid . Psychiatric:unable to assess . Neurologic: no seizure no involuntary movements         Lab Data:   Basic Metabolic Panel: Recent Labs  Lab 09/11/19 0540 09/12/19 0520 09/13/19 0535 09/14/19 0520 09/15/19 0711 09/16/19 0435 09/16/19 1041  NA 133* 136 135 139 140 138 140  K 3.3* 3.7 3.2* 3.5 3.2* 3.8 3.8  CL 92* 96* 93* 96* 99 101 103  CO2 28 28 32 33* 31 27 27   GLUCOSE 143* 144* 112* 95 101* 95 98  BUN 60* 49* 48* 46* 43* 40* 37*  CREATININE 0.96 0.84 0.79 0.83 0.85 0.93 1.00  CALCIUM 9.7 9.4 9.2 9.2 9.2 9.1 9.3  MG 2.2 2.2 2.0 2.1  --   --   --   PHOS 3.0 2.7 2.0* 2.7 2.6 3.0  --     ABG: Recent Labs  Lab 09/17/19 0025  PHART 7.475*  PCO2ART 41.3  PO2ART 76.7*  HCO3 30.1*  O2SAT 95.5    Liver Function Tests: Recent Labs  Lab 09/13/19 0535 09/14/19 0520 09/15/19 0711 09/16/19 0435 09/16/19 1041  AST 21 25 21 18 26    ALT 33 41 35 30 25  ALKPHOS 29* 40 43 45 47  BILITOT 0.5 0.8 0.7 1.7* 1.5*  PROT 5.4* 5.6* 5.6* 5.8* 5.5*  ALBUMIN 2.6* 2.7* 2.7* 2.8* 2.5*   No results for input(s): LIPASE, AMYLASE in the last 168 hours. No results for input(s): AMMONIA in the last 168 hours.  CBC: Recent Labs  Lab 09/13/19 0535 09/14/19 0520 09/15/19 0711 09/16/19 0435 09/16/19 1041  WBC 16.5* 16.6* 13.8* 12.5* 12.1*  NEUTROABS 13.6* 12.6* 10.3* 9.5* 9.2*  HGB 11.1* 12.8* 12.5* 12.7* 12.7*  HCT 33.5* 39.6 39.0 39.4 38.4*  MCV 95.4 97.5 97.5 97.5 95.3  PLT 356 321 315 296 304    Cardiac Enzymes: No results for input(s): CKTOTAL, CKMB, CKMBINDEX, TROPONINI in the last 168 hours.  BNP (last 3 results) No results for input(s): BNP in the last 8760 hours.  ProBNP (last 3 results) No results for input(s): PROBNP in the last 8760 hours.  Radiological Exams: Dg Chest Port 1 View  Result Date: 09/17/2019 CLINICAL DATA:  Respiratory failure EXAM: PORTABLE CHEST 1 VIEW COMPARISON:  September 16, 2019 FINDINGS: Tracheostomy present with tip 11.6 cm above the carina. No evident pneumothorax. The lungs are hyperexpanded. There is a small area of opacity in the right base with questionable associated cavitation. Lungs elsewhere are  clear. Heart is upper normal in size with pulmonary vascularity normal. No adenopathy. No bone lesions appreciable. IMPRESSION: Tracheostomy as described without pneumothorax. Lungs hyperexpanded. Question early cavitation right base. Lungs elsewhere clear. Stable cardiac silhouette. Electronically Signed   By: Bretta Bang III M.D.   On: 09/17/2019 11:07   Dg Chest Port 1 View  Result Date: 09/16/2019 CLINICAL DATA:  COVID 19. Pneumonia EXAM: PORTABLE CHEST 1 VIEW COMPARISON:  09/15/2019 FINDINGS: ETT tip is stable above the carina. Unchanged cardiac enlargement. There are bilateral pleural effusions, left greater than right. Unchanged appearance of left lower lobe airspace  consolidation. IMPRESSION: 1. Stable left lower lobe airspace consolidation compatible with pneumonia. Electronically Signed   By: Signa Kell M.D.   On: 09/16/2019 10:22    Assessment/Plan Active Problems:   Acute on chronic respiratory failure with hypoxia (HCC)   COVID-19 virus infection   Pneumonia due to COVID-19 virus   Chronic systolic heart failure ([facility])   Sepsis due to Haemophilus influenzae with acute hypoxic respiratory failure and septic shock ([facility])   Chronic atrial fibrillation ([facility])   1. Acute on chronic respiratory failure hypoxia we will continue with full vent support still tenuous respiratory status with weaning patient still has significant airspace disease noted on the most recent chest film 2. COVID-19 pneumonia treated continue to follow radiologically 3. COVID-19 virus infection at baseline continue with supportive care 4. Chronic systolic heart failure right now is compensated 5. Sepsis treated hemodynamics are stable 6. Chronic atrial fibrillation rate is controlled at this time   I have personally seen and evaluated the patient, evaluated laboratory and imaging results, formulated the assessment and plan and placed orders. The Patient requires high complexity decision making for assessment and support.  Case was discussed on Rounds with the Respiratory Therapy Staff  Yevonne Pax, MD [facility] Pulmonary Critical Care Medicine Sleep Medicine

## 2019-09-18 DIAGNOSIS — I482 Chronic atrial fibrillation, unspecified: Secondary | ICD-10-CM | POA: Diagnosis not present

## 2019-09-18 DIAGNOSIS — I5022 Chronic systolic (congestive) heart failure: Secondary | ICD-10-CM | POA: Diagnosis not present

## 2019-09-18 DIAGNOSIS — U071 COVID-19: Secondary | ICD-10-CM | POA: Diagnosis not present

## 2019-09-18 DIAGNOSIS — J9621 Acute and chronic respiratory failure with hypoxia: Secondary | ICD-10-CM | POA: Diagnosis not present

## 2019-09-18 LAB — BLOOD GAS, ARTERIAL
Acid-Base Excess: 5.5 mmol/L — ABNORMAL HIGH (ref 0.0–2.0)
Bicarbonate: 31.4 mmol/L — ABNORMAL HIGH (ref 20.0–28.0)
FIO2: 35
MECHVT: 430 mL
O2 Saturation: 95.1 %
PEEP: 5 cmH2O
Patient temperature: 98.6
RATE: 25 resp/min
pCO2 arterial: 63.8 mmHg — ABNORMAL HIGH (ref 32.0–48.0)
pH, Arterial: 7.312 — ABNORMAL LOW (ref 7.350–7.450)
pO2, Arterial: 86.3 mmHg (ref 83.0–108.0)

## 2019-09-18 LAB — CBC
HCT: 40.2 % (ref 39.0–52.0)
Hemoglobin: 13 g/dL (ref 13.0–17.0)
MCH: 32 pg (ref 26.0–34.0)
MCHC: 32.3 g/dL (ref 30.0–36.0)
MCV: 99 fL (ref 80.0–100.0)
Platelets: 275 10*3/uL (ref 150–400)
RBC: 4.06 MIL/uL — ABNORMAL LOW (ref 4.22–5.81)
RDW: 13.8 % (ref 11.5–15.5)
WBC: 15.9 10*3/uL — ABNORMAL HIGH (ref 4.0–10.5)
nRBC: 0 % (ref 0.0–0.2)

## 2019-09-18 LAB — BASIC METABOLIC PANEL
Anion gap: 9 (ref 5–15)
BUN: 47 mg/dL — ABNORMAL HIGH (ref 8–23)
CO2: 32 mmol/L (ref 22–32)
Calcium: 9.5 mg/dL (ref 8.9–10.3)
Chloride: 102 mmol/L (ref 98–111)
Creatinine, Ser: 1.25 mg/dL — ABNORMAL HIGH (ref 0.61–1.24)
GFR calc Af Amer: >60 mL/min (ref >60)
GFR calc non Af Amer: 58 mL/min — ABNORMAL LOW (ref >60)
Glucose, Bld: 139 mg/dL — ABNORMAL HIGH (ref 70–99)
Potassium: 4.5 mmol/L (ref 3.5–5.1)
Sodium: 143 mmol/L (ref 135–145)

## 2019-09-18 NOTE — Progress Notes (Signed)
Pulmonary Critical Care Medicine [facility]   PULMONARY CRITICAL CARE SERVICE  PROGRESS NOTE  Date of Service: 09/18/2019  Sergio Mitchell  QZR:[id number]  DOB: 09/23/1951   DOA: 09/16/2019  Referring Physician: Carron Curie, MD  HPI: Sergio Mitchell is a 68 y.o. male seen for follow up of Acute on Chronic Respiratory Failure.  Patient currently is on full support on assist control mode has been on 35% FiO2 supposed to wean on pressure support 2 hours today  Medications: Reviewed on Rounds  Physical Exam:  Vitals: Temperature 96.5 pulse 109 respiratory rate 24 blood pressure 124/83 saturations 100%  Ventilator Settings mode of ventilation assist control FiO2 35% tidal volume 430 PEEP 5  . General: Comfortable at this time . Eyes: Grossly normal lids, irises & conjunctiva . ENT: grossly tongue is normal . Neck: no obvious mass . Cardiovascular: S1 S2 normal no gallop . Respiratory: No rhonchi coarse breath sounds . Abdomen: soft . Skin: no rash seen on limited exam . Musculoskeletal: not rigid . Psychiatric:unable to assess . Neurologic: no seizure no involuntary movements         Lab Data:   Basic Metabolic Panel: Recent Labs  Lab 09/12/19 0520 09/13/19 0535 09/14/19 0520 09/15/19 0711 09/16/19 0435 09/16/19 1041 09/18/19 1056  NA 136 135 139 140 138 140 143  K 3.7 3.2* 3.5 3.2* 3.8 3.8 4.5  CL 96* 93* 96* 99 101 103 102  CO2 28 32 33* 31 27 27  32  GLUCOSE 144* 112* 95 101* 95 98 139*  BUN 49* 48* 46* 43* 40* 37* 47*  CREATININE 0.84 0.79 0.83 0.85 0.93 1.00 1.25*  CALCIUM 9.4 9.2 9.2 9.2 9.1 9.3 9.5  MG 2.2 2.0 2.1  --   --   --   --   PHOS 2.7 2.0* 2.7 2.6 3.0  --   --     ABG: Recent Labs  Lab 09/17/19 0025 09/18/19 0605  PHART 7.475* 7.312*  PCO2ART 41.3 63.8*  PO2ART 76.7* 86.3  HCO3 30.1* 31.4*  O2SAT 95.5 95.1    Liver Function Tests: Recent Labs  Lab 09/13/19 0535 09/14/19 0520 09/15/19 0711 09/16/19 0435  09/16/19 1041  AST 21 25 21 18 26   ALT 33 41 35 30 25  ALKPHOS 29* 40 43 45 47  BILITOT 0.5 0.8 0.7 1.7* 1.5*  PROT 5.4* 5.6* 5.6* 5.8* 5.5*  ALBUMIN 2.6* 2.7* 2.7* 2.8* 2.5*   No results for input(s): LIPASE, AMYLASE in the last 168 hours. No results for input(s): AMMONIA in the last 168 hours.  CBC: Recent Labs  Lab 09/13/19 0535 09/14/19 0520 09/15/19 0711 09/16/19 0435 09/16/19 1041 09/18/19 1056  WBC 16.5* 16.6* 13.8* 12.5* 12.1* 15.9*  NEUTROABS 13.6* 12.6* 10.3* 9.5* 9.2*  --   HGB 11.1* 12.8* 12.5* 12.7* 12.7* 13.0  HCT 33.5* 39.6 39.0 39.4 38.4* 40.2  MCV 95.4 97.5 97.5 97.5 95.3 99.0  PLT 356 321 315 296 304 275    Cardiac Enzymes: No results for input(s): CKTOTAL, CKMB, CKMBINDEX, TROPONINI in the last 168 hours.  BNP (last 3 results) No results for input(s): BNP in the last 8760 hours.  ProBNP (last 3 results) No results for input(s): PROBNP in the last 8760 hours.  Radiological Exams: Dg Chest Port 1 View  Result Date: 09/17/2019 CLINICAL DATA:  Respiratory failure EXAM: PORTABLE CHEST 1 VIEW COMPARISON:  September 16, 2019 FINDINGS: Tracheostomy present with tip 11.6 cm above the carina. No evident pneumothorax. The lungs  are hyperexpanded. There is a small area of opacity in the right base with questionable associated cavitation. Lungs elsewhere are clear. Heart is upper normal in size with pulmonary vascularity normal. No adenopathy. No bone lesions appreciable. IMPRESSION: Tracheostomy as described without pneumothorax. Lungs hyperexpanded. Question early cavitation right base. Lungs elsewhere clear. Stable cardiac silhouette. Electronically Signed   By: Lowella Grip III M.D.   On: 09/17/2019 11:07    Assessment/Plan Active Problems:   Acute on chronic respiratory failure with hypoxia (HCC)   COVID-19 virus infection   Pneumonia due to COVID-19 virus   Chronic systolic heart failure ([facility])   Sepsis due to Haemophilus influenzae with acute hypoxic  respiratory failure and septic shock ([facility]CC)   Chronic atrial fibrillation ([place])   1. Acute on chronic respiratory failure with hypoxia continue with full support on assist control mode patient is currently on 35% FiO2 tidal volume 430 PEEP 5 supposed to wean for pressure support 2 hours 2. COVID-19 virus infection in resolution phase 3. Pneumonia due to Covid slowly improving still has some issues with infiltrates 4. Chronic systolic heart failure at baseline 5. Severe sepsis treated 6. Chronic atrial fibrillation rate controlled   I have personally seen and evaluated the patient, evaluated laboratory and imaging results, formulated the assessment and plan and placed orders. The Patient requires high complexity decision making for assessment and support.  Case was discussed on Rounds with the Respiratory Therapy Staff  Allyne Gee, MD [facility] Pulmonary Critical Care Medicine Sleep Medicine

## 2019-09-19 DIAGNOSIS — I482 Chronic atrial fibrillation, unspecified: Secondary | ICD-10-CM | POA: Diagnosis not present

## 2019-09-19 DIAGNOSIS — I5022 Chronic systolic (congestive) heart failure: Secondary | ICD-10-CM | POA: Diagnosis not present

## 2019-09-19 DIAGNOSIS — U071 COVID-19: Secondary | ICD-10-CM | POA: Diagnosis not present

## 2019-09-19 DIAGNOSIS — J9621 Acute and chronic respiratory failure with hypoxia: Secondary | ICD-10-CM | POA: Diagnosis not present

## 2019-09-19 NOTE — Progress Notes (Signed)
Pulmonary Critical Care Medicine [place]   PULMONARY CRITICAL CARE SERVICE  PROGRESS NOTE  Date of Service: 09/19/2019  Sergio Mitchell  IOE:[id number]  DOB: 06/04/1951   DOA: 09/16/2019  Referring Physician: [name], MD  HPI: Sergio Mitchell is a 68 y.o. male seen for follow up of Acute on Chronic Respiratory Failure.  Patient is weaning today has been on 35% FiO2 remains on pressure support mode  Medications: Reviewed on Rounds  Physical Exam:  Vitals: Temperature 97.8 pulse 63 respiratory 20 blood pressure one 1/53 saturations 93%  Ventilator Settings mode ventilation pressure support FiO2 35% tidal limb 700 pressure 12 PEEP 5  . General: Comfortable at this time . Eyes: Grossly normal lids, irises & conjunctiva . ENT: grossly tongue is normal . Neck: no obvious mass . Cardiovascular: S1 S2 normal no gallop . Respiratory: Scattered rhonchi expansion is equal . Abdomen: soft . Skin: no rash seen on limited exam . Musculoskeletal: not rigid . Psychiatric:unable to assess . Neurologic: no seizure no involuntary movements         Lab Data:   Basic Metabolic Panel: Recent Labs  Lab 09/13/19 0535 09/14/19 0520 09/15/19 0711 09/16/19 0435 09/16/19 1041 09/18/19 1056  NA 135 139 140 138 140 143  K 3.2* 3.5 3.2* 3.8 3.8 4.5  CL 93* 96* 99 101 103 102  CO2 32 33* 31 27 27  32  GLUCOSE 112* 95 101* 95 98 139*  BUN 48* 46* 43* 40* 37* 47*  CREATININE 0.79 0.83 0.85 0.93 1.00 1.25*  CALCIUM 9.2 9.2 9.2 9.1 9.3 9.5  MG 2.0 2.1  --   --   --   --   PHOS 2.0* 2.7 2.6 3.0  --   --     ABG: Recent Labs  Lab 09/17/19 0025 09/18/19 0605  PHART 7.475* 7.312*  PCO2ART 41.3 63.8*  PO2ART 76.7* 86.3  HCO3 30.1* 31.4*  O2SAT 95.5 95.1    Liver Function Tests: Recent Labs  Lab 09/13/19 0535 09/14/19 0520 09/15/19 0711 09/16/19 0435 09/16/19 1041  AST 21 25 21 18 26   ALT 33 41 35 30 25  ALKPHOS 29* 40 43 45 47  BILITOT 0.5 0.8 0.7  1.7* 1.5*  PROT 5.4* 5.6* 5.6* 5.8* 5.5*  ALBUMIN 2.6* 2.7* 2.7* 2.8* 2.5*   No results for input(s): LIPASE, AMYLASE in the last 168 hours. No results for input(s): AMMONIA in the last 168 hours.  CBC: Recent Labs  Lab 09/13/19 0535 09/14/19 0520 09/15/19 0711 09/16/19 0435 09/16/19 1041 09/18/19 1056  WBC 16.5* 16.6* 13.8* 12.5* 12.1* 15.9*  NEUTROABS 13.6* 12.6* 10.3* 9.5* 9.2*  --   HGB 11.1* 12.8* 12.5* 12.7* 12.7* 13.0  HCT 33.5* 39.6 39.0 39.4 38.4* 40.2  MCV 95.4 97.5 97.5 97.5 95.3 99.0  PLT 356 321 315 296 304 275    Cardiac Enzymes: No results for input(s): CKTOTAL, CKMB, CKMBINDEX, TROPONINI in the last 168 hours.  BNP (last 3 results) No results for input(s): BNP in the last 8760 hours.  ProBNP (last 3 results) No results for input(s): PROBNP in the last 8760 hours.  Radiological Exams: No results found.  Assessment/Plan Active Problems:   Acute on chronic respiratory failure with hypoxia (HCC)   COVID-19 virus infection   Pneumonia due to COVID-19 virus   Chronic systolic heart failure (HCC)   Sepsis due to Haemophilus influenzae with acute hypoxic respiratory failure and septic shock (HCC)   Chronic atrial fibrillation ([place])  1. Acute on chronic respiratory failure with hypoxia we will continue with the wean the today the goal is for 8 hours 2. COVID-19 virus infection treated we will continue with supportive care 3. Pneumonia due to COVID-19 slowly improving continue to monitor 4. Chronic systolic heart failure compensated 5. Severe sepsis resolved hemodynamics are improved 6. Chronic atrial fibrillation rate controlled   I have personally seen and evaluated the patient, evaluated laboratory and imaging results, formulated the assessment and plan and placed orders. The Patient requires high complexity decision making for assessment and support.  Case was discussed on Rounds with the Respiratory Therapy Staff  Yevonne Pax, MD [facility] Pulmonary  Critical Care Medicine Sleep Medicine

## 2019-09-20 DIAGNOSIS — I5022 Chronic systolic (congestive) heart failure: Secondary | ICD-10-CM | POA: Diagnosis not present

## 2019-09-20 DIAGNOSIS — J9621 Acute and chronic respiratory failure with hypoxia: Secondary | ICD-10-CM | POA: Diagnosis not present

## 2019-09-20 DIAGNOSIS — I482 Chronic atrial fibrillation, unspecified: Secondary | ICD-10-CM | POA: Diagnosis not present

## 2019-09-20 DIAGNOSIS — U071 COVID-19: Secondary | ICD-10-CM | POA: Diagnosis not present

## 2019-09-20 NOTE — Progress Notes (Addendum)
Pulmonary Critical Care Medicine [facility] [place]   PULMONARY CRITICAL CARE SERVICE  PROGRESS NOTE  Date of Service: 09/20/2019  Sergio Mitchell  DDU:[id number]  DOB: 01-22-1951   DOA: 09/16/2019  Referring Physician: Carron Curie, MD  HPI: Sergio Mitchell is a 68 y.o. male seen for follow up of Acute on Chronic Respiratory Failure.  Patient currently is on full support on assist control mode has been on 40% FiO2  Medications: Reviewed on Rounds  Physical Exam:  Vitals: Temperature 97.5 pulse 70 respiratory 21 blood pressure 116/55 saturations 93%  Ventilator Settings mode ventilation assist control FiO2 40% tidal volume 430 PEEP 5  . General: Comfortable at this time . Eyes: Grossly normal lids, irises & conjunctiva . ENT: grossly tongue is normal . Neck: no obvious mass . Cardiovascular: S1 S2 normal no gallop . Respiratory: No rhonchi no rales are noted at this time . Abdomen: soft . Skin: no rash seen on limited exam . Musculoskeletal: not rigid . Psychiatric:unable to assess . Neurologic: no seizure no involuntary movements         Lab Data:   Basic Metabolic Panel: Recent Labs  Lab 09/14/19 0520 09/15/19 0711 09/16/19 0435 09/16/19 1041 09/18/19 1056  NA 139 140 138 140 143  K 3.5 3.2* 3.8 3.8 4.5  CL 96* 99 101 103 102  CO2 33* 31 27 27  32  GLUCOSE 95 101* 95 98 139*  BUN 46* 43* 40* 37* 47*  CREATININE 0.83 0.85 0.93 1.00 1.25*  CALCIUM 9.2 9.2 9.1 9.3 9.5  MG 2.1  --   --   --   --   PHOS 2.7 2.6 3.0  --   --     ABG: Recent Labs  Lab 09/17/19 0025 09/18/19 0605  PHART 7.475* 7.312*  PCO2ART 41.3 63.8*  PO2ART 76.7* 86.3  HCO3 30.1* 31.4*  O2SAT 95.5 95.1    Liver Function Tests: Recent Labs  Lab 09/14/19 0520 09/15/19 0711 09/16/19 0435 09/16/19 1041  AST 25 21 18 26   ALT 41 35 30 25  ALKPHOS 40 43 45 47  BILITOT 0.8 0.7 1.7* 1.5*  PROT 5.6* 5.6* 5.8* 5.5*  ALBUMIN 2.7* 2.7* 2.8* 2.5*   No results for input(s):  LIPASE, AMYLASE in the last 168 hours. No results for input(s): AMMONIA in the last 168 hours.  CBC: Recent Labs  Lab 09/14/19 0520 09/15/19 0711 09/16/19 0435 09/16/19 1041 09/18/19 1056  WBC 16.6* 13.8* 12.5* 12.1* 15.9*  NEUTROABS 12.6* 10.3* 9.5* 9.2*  --   HGB 12.8* 12.5* 12.7* 12.7* 13.0  HCT 39.6 39.0 39.4 38.4* 40.2  MCV 97.5 97.5 97.5 95.3 99.0  PLT 321 315 296 304 275    Cardiac Enzymes: No results for input(s): CKTOTAL, CKMB, CKMBINDEX, TROPONINI in the last 168 hours.  BNP (last 3 results) No results for input(s): BNP in the last 8760 hours.  ProBNP (last 3 results) No results for input(s): PROBNP in the last 8760 hours.  Radiological Exams: No results found.  Assessment/Plan Active Problems:   Acute on chronic respiratory failure with hypoxia (HCC)   COVID-19 virus infection   Pneumonia due to COVID-19 virus   Chronic systolic heart failure (HCC)   Sepsis due to Haemophilus influenzae with acute hypoxic respiratory failure and septic shock (HCC)   Chronic atrial fibrillation (HCC)   1. Acute on chronic respiratory failure with hypoxia resolved now resting on the ventilator will be starting on pressure support mode 2. COVID-19 virus infection resolved  3. Pneumonia due to COVID-19 treated resolved 4. Chronic systolic heart failure compensated 5. Sepsis resolved 6. Chronic atrial fibrillation rate controlled   I have personally seen and evaluated the patient, evaluated laboratory and imaging results, formulated the assessment and plan and placed orders. The Patient requires high complexity decision making for assessment and support.  Case was discussed on Rounds with the Respiratory Therapy Staff  Allyne Gee, MD [facility] Pulmonary Critical Care Medicine Sleep Medicine

## 2019-09-21 DIAGNOSIS — J9621 Acute and chronic respiratory failure with hypoxia: Secondary | ICD-10-CM | POA: Diagnosis not present

## 2019-09-21 DIAGNOSIS — I5022 Chronic systolic (congestive) heart failure: Secondary | ICD-10-CM | POA: Diagnosis not present

## 2019-09-21 DIAGNOSIS — I482 Chronic atrial fibrillation, unspecified: Secondary | ICD-10-CM | POA: Diagnosis not present

## 2019-09-21 DIAGNOSIS — U071 COVID-19: Secondary | ICD-10-CM | POA: Diagnosis not present

## 2019-09-21 LAB — CULTURE, RESPIRATORY W GRAM STAIN: Culture: NORMAL

## 2019-09-21 NOTE — Progress Notes (Signed)
Pulmonary Critical Care Medicine [facility] [place]   PULMONARY CRITICAL CARE SERVICE  PROGRESS NOTE  Date of Service: 09/21/2019  Sergio Mitchell  ALP:[id number]  DOB: 1951/03/23   DOA: 09/16/2019  Referring Physician: Carron Curie, MD  HPI: Sergio Mitchell is a 68 y.o. male seen for follow up of Acute on Chronic Respiratory Failure.  Patient is on pressure support mode currently on 40% FiO2 good saturations are noted.  Patient supposed to do 2 hours off the ventilator today  Medications: Reviewed on Rounds  Physical Exam:  Vitals: Temperature 97.4 pulse 84 respiratory rate 29 blood pressure 109/64 saturations 98%  Ventilator Settings mode ventilation pressure support FiO2 40% pressure support 12 PEEP 5  . General: Comfortable at this time . Eyes: Grossly normal lids, irises & conjunctiva . ENT: grossly tongue is normal . Neck: no obvious mass . Cardiovascular: S1 S2 normal no gallop . Respiratory: No rhonchi no rales are noted at this time . Abdomen: soft . Skin: no rash seen on limited exam . Musculoskeletal: not rigid . Psychiatric:unable to assess . Neurologic: no seizure no involuntary movements         Lab Data:   Basic Metabolic Panel: Recent Labs  Lab 09/15/19 0711 09/16/19 0435 09/16/19 1041 09/18/19 1056  NA 140 138 140 143  K 3.2* 3.8 3.8 4.5  CL 99 101 103 102  CO2 31 27 27  32  GLUCOSE 101* 95 98 139*  BUN 43* 40* 37* 47*  CREATININE 0.85 0.93 1.00 1.25*  CALCIUM 9.2 9.1 9.3 9.5  PHOS 2.6 3.0  --   --     ABG: Recent Labs  Lab 09/17/19 0025 09/18/19 0605  PHART 7.475* 7.312*  PCO2ART 41.3 63.8*  PO2ART 76.7* 86.3  HCO3 30.1* 31.4*  O2SAT 95.5 95.1    Liver Function Tests: Recent Labs  Lab 09/15/19 0711 09/16/19 0435 09/16/19 1041  AST 21 18 26   ALT 35 30 25  ALKPHOS 43 45 47  BILITOT 0.7 1.7* 1.5*  PROT 5.6* 5.8* 5.5*  ALBUMIN 2.7* 2.8* 2.5*   No results for input(s): LIPASE, AMYLASE in the last 168 hours. No  results for input(s): AMMONIA in the last 168 hours.  CBC: Recent Labs  Lab 09/15/19 0711 09/16/19 0435 09/16/19 1041 09/18/19 1056  WBC 13.8* 12.5* 12.1* 15.9*  NEUTROABS 10.3* 9.5* 9.2*  --   HGB 12.5* 12.7* 12.7* 13.0  HCT 39.0 39.4 38.4* 40.2  MCV 97.5 97.5 95.3 99.0  PLT 315 296 304 275    Cardiac Enzymes: No results for input(s): CKTOTAL, CKMB, CKMBINDEX, TROPONINI in the last 168 hours.  BNP (last 3 results) No results for input(s): BNP in the last 8760 hours.  ProBNP (last 3 results) No results for input(s): PROBNP in the last 8760 hours.  Radiological Exams: No results found.  Assessment/Plan Active Problems:   Acute on chronic respiratory failure with hypoxia (HCC)   COVID-19 virus infection   Pneumonia due to COVID-19 virus   Chronic systolic heart failure (HCC)   Sepsis due to Haemophilus influenzae with acute hypoxic respiratory failure and septic shock (HCC)   Chronic atrial fibrillation (HCC)   1. Acute on chronic respiratory failure hypoxia plan is to continue with full support on pressure support and wean for 2 hours off the vent 2. COVID-19 virus infection treated 3. Chronic systolic heart failure at baseline 4. Sepsis treated we will continue with supportive care 5. Chronic atrial fibrillation rate controlled   I have personally  seen and evaluated the patient, evaluated laboratory and imaging results, formulated the assessment and plan and placed orders. The Patient requires high complexity decision making for assessment and support.  Case was discussed on Rounds with the Respiratory Therapy Staff  Allyne Gee, MD [facility] Pulmonary Critical Care Medicine Sleep Medicine

## 2019-09-22 DIAGNOSIS — I5022 Chronic systolic (congestive) heart failure: Secondary | ICD-10-CM | POA: Diagnosis not present

## 2019-09-22 DIAGNOSIS — J9621 Acute and chronic respiratory failure with hypoxia: Secondary | ICD-10-CM | POA: Diagnosis not present

## 2019-09-22 DIAGNOSIS — I482 Chronic atrial fibrillation, unspecified: Secondary | ICD-10-CM | POA: Diagnosis not present

## 2019-09-22 DIAGNOSIS — U071 COVID-19: Secondary | ICD-10-CM | POA: Diagnosis not present

## 2019-09-22 NOTE — Progress Notes (Signed)
Pulmonary Critical Care Medicine [place]   PULMONARY CRITICAL CARE SERVICE  PROGRESS NOTE  Date of Service: 09/22/2019  Sergio Mitchell  SEG:[id number]  DOB: 1951-03-15   DOA: 09/16/2019  Referring Physician: [name], MD  HPI: Sergio Mitchell is a 68 y.o. male seen for follow up of Acute on Chronic Respiratory Failure.  This patient is on the nag device is weaning on protocol 68 year old with a goal of 12 hours  Medications: Reviewed on Rounds  Physical Exam:  Vitals: Temperature 97.4 pulse 60 respiratory 25 blood pressure 94/51 saturations 98%  Ventilator Settings off the ventilator right now on the nag  . General: Comfortable at this time . Eyes: Grossly normal lids, irises & conjunctiva . ENT: grossly tongue is normal . Neck: no obvious mass . Cardiovascular: S1 S2 normal no gallop . Respiratory: No rhonchi no rales are noted at this time . Abdomen: soft . Skin: no rash seen on limited exam . Musculoskeletal: not rigid . Psychiatric:unable to assess . Neurologic: no seizure no involuntary movements         Lab Data:   Basic Metabolic Panel: Recent Labs  Lab 09/16/19 0435 09/16/19 1041 09/18/19 1056  NA 138 140 143  K 3.8 3.8 4.5  CL 101 103 102  CO2 27 27 32  GLUCOSE 95 98 139*  BUN 40* 37* 47*  CREATININE 0.93 1.00 1.25*  CALCIUM 9.1 9.3 9.5  PHOS 3.0  --   --     ABG: Recent Labs  Lab 09/17/19 0025 09/18/19 0605  PHART 7.475* 7.312*  PCO2ART 41.3 63.8*  PO2ART 76.7* 86.3  HCO3 30.1* 31.4*  O2SAT 95.5 95.1    Liver Function Tests: Recent Labs  Lab 09/16/19 0435 09/16/19 1041  AST 18 26  ALT 30 25  ALKPHOS 45 47  BILITOT 1.7* 1.5*  PROT 5.8* 5.5*  ALBUMIN 2.8* 2.5*   No results for input(s): LIPASE, AMYLASE in the last 168 hours. No results for input(s): AMMONIA in the last 168 hours.  CBC: Recent Labs  Lab 09/16/19 0435 09/16/19 1041 09/18/19 1056  WBC 12.5* 12.1* 15.9*  NEUTROABS 9.5* 9.2*  --    HGB 12.7* 12.7* 13.0  HCT 39.4 38.4* 40.2  MCV 97.5 95.3 99.0  PLT 296 304 275    Cardiac Enzymes: No results for input(s): CKTOTAL, CKMB, CKMBINDEX, TROPONINI in the last 168 hours.  BNP (last 3 results) No results for input(s): BNP in the last 8760 hours.  ProBNP (last 3 results) No results for input(s): PROBNP in the last 8760 hours.  Radiological Exams: No results found.  Assessment/Plan Active Problems:   Acute on chronic respiratory failure with hypoxia (HCC)   COVID-19 virus infection   Pneumonia due to COVID-19 virus   Chronic systolic heart failure (HCC)   Sepsis due to Haemophilus influenzae with acute hypoxic respiratory failure and septic shock (HCC)   Chronic atrial fibrillation ([place])   1. Acute on chronic respiratory failure hypoxia continue weaning as tolerated the goal today is 12 hours 2. COVID-19 virus infection in recovery phase 3. Pneumonia due to COVID-19 treated we will continue supportive care 4. Chronic systolic heart failure at baseline 5. Sepsis treated 6. Chronic atrial fibrillation rate controlled   I have personally seen and evaluated the patient, evaluated laboratory and imaging results, formulated the assessment and plan and placed orders. The Patient requires high complexity decision making for assessment and support.  Case was discussed on Rounds with the Respiratory Therapy  Staff  Allyne Gee, MD [facility] Pulmonary Critical Care Medicine Sleep Medicine

## 2019-09-23 DIAGNOSIS — I5022 Chronic systolic (congestive) heart failure: Secondary | ICD-10-CM | POA: Diagnosis not present

## 2019-09-23 DIAGNOSIS — J9621 Acute and chronic respiratory failure with hypoxia: Secondary | ICD-10-CM | POA: Diagnosis not present

## 2019-09-23 DIAGNOSIS — I482 Chronic atrial fibrillation, unspecified: Secondary | ICD-10-CM | POA: Diagnosis not present

## 2019-09-23 DIAGNOSIS — U071 COVID-19: Secondary | ICD-10-CM | POA: Diagnosis not present

## 2019-09-23 NOTE — Progress Notes (Signed)
Pulmonary Critical Care Medicine [place]   PULMONARY CRITICAL CARE SERVICE  PROGRESS NOTE  Date of Service: 09/23/2019  Sergio Mitchell  HYW:[id number]  DOB: May 17, 1951   DOA: 09/16/2019  Referring Physician: [name], MD  HPI: Sergio Mitchell is a 68 y.o. male seen for follow up of Acute on Chronic Respiratory Failure.  Patient continues to do fairly well on the weaning right now is on a nag device for 4 L oxygen  Medications: Reviewed on Rounds  Physical Exam:  Vitals: Temperature 97.3 pulse 74 respiratory rate 20 blood pressure 102/81 saturations 95%  Ventilator Settings off the ventilator on the nag device  . General: Comfortable at this time . Eyes: Grossly normal lids, irises & conjunctiva . ENT: grossly tongue is normal . Neck: no obvious mass . Cardiovascular: S1 S2 normal no gallop . Respiratory: No rhonchi no rales are noted at this time . Abdomen: soft . Skin: no rash seen on limited exam . Musculoskeletal: not rigid . Psychiatric:unable to assess . Neurologic: no seizure no involuntary movements         Lab Data:   Basic Metabolic Panel: Recent Labs  Lab 09/18/19 1056  NA 143  K 4.5  CL 102  CO2 32  GLUCOSE 139*  BUN 47*  CREATININE 1.25*  CALCIUM 9.5    ABG: Recent Labs  Lab 09/17/19 0025 09/18/19 0605  PHART 7.475* 7.312*  PCO2ART 41.3 63.8*  PO2ART 76.7* 86.3  HCO3 30.1* 31.4*  O2SAT 95.5 95.1    Liver Function Tests: No results for input(s): AST, ALT, ALKPHOS, BILITOT, PROT, ALBUMIN in the last 168 hours. No results for input(s): LIPASE, AMYLASE in the last 168 hours. No results for input(s): AMMONIA in the last 168 hours.  CBC: Recent Labs  Lab 09/18/19 1056  WBC 15.9*  HGB 13.0  HCT 40.2  MCV 99.0  PLT 275    Cardiac Enzymes: No results for input(s): CKTOTAL, CKMB, CKMBINDEX, TROPONINI in the last 168 hours.  BNP (last 3 results) No results for input(s): BNP in the last 8760 hours.  ProBNP  (last 3 results) No results for input(s): PROBNP in the last 8760 hours.  Radiological Exams: No results found.  Assessment/Plan Active Problems:   Acute on chronic respiratory failure with hypoxia (HCC)   COVID-19 virus infection   Pneumonia due to COVID-19 virus   Chronic systolic heart failure (HCC)   Sepsis due to Haemophilus influenzae with acute hypoxic respiratory failure and septic shock (HCC)   Chronic atrial fibrillation ([place])   1. Acute on chronic respiratory failure with hypoxia continue with weaning on mag device the goal is for 16 hours 2. COVID-19 virus infection in resolution phase 3. Pneumonia due to COVID-19 treated 4. Chronic systolic heart failure at baseline we will continue with present management 5. Sepsis due to Haemophilus influenzae treated 6. Chronic atrial fibrillation rate controlled at this time   I have personally seen and evaluated the patient, evaluated laboratory and imaging results, formulated the assessment and plan and placed orders. The Patient requires high complexity decision making for assessment and support.  Case was discussed on Rounds with the Respiratory Therapy Staff  Allyne Gee, MD [facility] Pulmonary Critical Care Medicine Sleep Medicine

## 2019-09-24 NOTE — Progress Notes (Signed)
Pulmonary Critical Care Medicine [place]   PULMONARY CRITICAL CARE SERVICE  PROGRESS NOTE  Date of Service: 09/24/2019  Sergio Mitchell  GNF:[id number]  DOB: Apr 27, 1951   DOA: 09/16/2019  Referring Physician: [name], MD  HPI: Sergio Mitchell is a 68 y.o. male seen for follow up of Acute on Chronic Respiratory Failure.  Patient is off the ventilator right now on the nag device has been requiring 4 L of oxygen  Medications: Reviewed on Rounds  Physical Exam:  Vitals: Temperature 97.5 pulse 64 respiratory rate 25 blood pressure 111/61 saturations 94%  Ventilator Settings on the nag device at this time  . General: Comfortable at this time . Eyes: Grossly normal lids, irises & conjunctiva . ENT: grossly tongue is normal . Neck: no obvious mass . Cardiovascular: S1 S2 normal no gallop . Respiratory: No rhonchi coarse breath sounds are noted . Abdomen: soft . Skin: no rash seen on limited exam . Musculoskeletal: not rigid . Psychiatric:unable to assess . Neurologic: no seizure no involuntary movements         Lab Data:   Basic Metabolic Panel: Recent Labs  Lab 09/18/19 1056  NA 143  K 4.5  CL 102  CO2 32  GLUCOSE 139*  BUN 47*  CREATININE 1.25*  CALCIUM 9.5    ABG: Recent Labs  Lab 09/18/19 0605  PHART 7.312*  PCO2ART 63.8*  PO2ART 86.3  HCO3 31.4*  O2SAT 95.1    Liver Function Tests: No results for input(s): AST, ALT, ALKPHOS, BILITOT, PROT, ALBUMIN in the last 168 hours. No results for input(s): LIPASE, AMYLASE in the last 168 hours. No results for input(s): AMMONIA in the last 168 hours.  CBC: Recent Labs  Lab 09/18/19 1056  WBC 15.9*  HGB 13.0  HCT 40.2  MCV 99.0  PLT 275    Cardiac Enzymes: No results for input(s): CKTOTAL, CKMB, CKMBINDEX, TROPONINI in the last 168 hours.  BNP (last 3 results) No results for input(s): BNP in the last 8760 hours.  ProBNP (last 3 results) No results for input(s): PROBNP in  the last 8760 hours.  Radiological Exams: No results found.  Assessment/Plan Active Problems:   Acute on chronic respiratory failure with hypoxia (HCC)   COVID-19 virus infection   Pneumonia due to COVID-19 virus   Chronic systolic heart failure (HCC)   Sepsis due to Haemophilus influenzae with acute hypoxic respiratory failure and septic shock (HCC)   Chronic atrial fibrillation ([place])   1. Acute on chronic respiratory failure with hypoxia we will continue with mag device as ordered patient is on 4 L 2. COVID-19 virus infection treated in resolution phase 3. Pneumonia due to COVID-19 treated 4. Chronic systolic heart failure baseline 5. Sepsis treated resolved 6. Chronic atrial fibrillation rate controlled   I have personally seen and evaluated the patient, evaluated laboratory and imaging results, formulated the assessment and plan and placed orders. The Patient requires high complexity decision making for assessment and support.  Case was discussed on Rounds with the Respiratory Therapy Staff  Allyne Gee, MD [facility] Pulmonary Critical Care Medicine Sleep Medicine

## 2019-09-25 NOTE — Progress Notes (Signed)
Pulmonary Critical Care Medicine [place]   PULMONARY CRITICAL CARE SERVICE  PROGRESS NOTE  Date of Service: 09/25/2019  Sergio Mitchell  GGY:[id number]  DOB: [birth year]   DOA: 09/16/2019  Referring Physician: [name], MD  HPI: Sergio Mitchell is a 68 y.o. male seen for follow up of Acute on Chronic Respiratory Failure.  Patient has been off the ventilator 24 hours so we are doing well with trying to advance the wean  Medications: Reviewed on Rounds  Physical Exam:  Vitals: Temperature 98.6 pulse 73 respiratory rate 17 blood pressure 90/57 saturations 96%  Ventilator Settings on nag device 4 L bleed  . General: Comfortable at this time . Eyes: Grossly normal lids, irises & conjunctiva . ENT: grossly tongue is normal . Neck: no obvious mass . Cardiovascular: S1 S2 normal no gallop . Respiratory: No rhonchi no rales are noted at this time . Abdomen: soft . Skin: no rash seen on limited exam . Musculoskeletal: not rigid . Psychiatric:unable to assess . Neurologic: no seizure no involuntary movements         Lab Data:   Basic Metabolic Panel: No results for input(s): NA, K, CL, CO2, GLUCOSE, BUN, CREATININE, CALCIUM, MG, PHOS in the last 168 hours.  ABG: No results for input(s): PHART, PCO2ART, PO2ART, HCO3, O2SAT in the last 168 hours.  Liver Function Tests: No results for input(s): AST, ALT, ALKPHOS, BILITOT, PROT, ALBUMIN in the last 168 hours. No results for input(s): LIPASE, AMYLASE in the last 168 hours. No results for input(s): AMMONIA in the last 168 hours.  CBC: No results for input(s): WBC, NEUTROABS, HGB, HCT, MCV, PLT in the last 168 hours.  Cardiac Enzymes: No results for input(s): CKTOTAL, CKMB, CKMBINDEX, TROPONINI in the last 168 hours.  BNP (last 3 results) No results for input(s): BNP in the last 8760 hours.  ProBNP (last 3 results) No results for input(s): PROBNP in the last 8760 hours.  Radiological Exams: No  results found.  Assessment/Plan Active Problems:   Acute on chronic respiratory failure with hypoxia (HCC)   COVID-19 virus infection   Pneumonia due to COVID-19 virus   Chronic systolic heart failure (HCC)   Sepsis due to Haemophilus influenzae with acute hypoxic respiratory failure and septic shock (HCC)   Chronic atrial fibrillation ([place])   1. Acute on chronic respiratory failure with hypoxia we will continue with oxygen therapy and supportive care patient is completing 24 hours 2. COVID-19 virus infection in resolution phase 3. Pneumonia treated 4. Chronic systolic heart failure at baseline 5. Severe sepsis resolved 6. Chronic atrial fibrillation rate controlled   I have personally seen and evaluated the patient, evaluated laboratory and imaging results, formulated the assessment and plan and placed orders. The Patient requires high complexity decision making for assessment and support.  Case was discussed on Rounds with the Respiratory Therapy Staff  Allyne Gee, MD [facility] Pulmonary Critical Care Medicine Sleep Medicine

## 2019-09-26 NOTE — Progress Notes (Signed)
Pulmonary Critical Care Medicine [place]   PULMONARY CRITICAL CARE SERVICE  PROGRESS NOTE  Date of Service: 09/26/2019  Sergio Mitchell  [id number]  DOB: 06/20/1951   DOA: 09/16/2019  Referring Physician: [name], MD  HPI: Sergio Mitchell is a 68 y.o. male seen for follow up of Acute on Chronic Respiratory Failure.  Patient currently is on a neck device has been off for 48 hours  Medications: Reviewed on Rounds  Physical Exam:  Vitals: Temperature 96.6 pulse 77 respiratory rate 20 blood pressure 102/89 saturations 95%  Ventilator Settings off the ventilator on the nag device  . General: Comfortable at this time . Eyes: Grossly normal lids, irises & conjunctiva . ENT: grossly tongue is normal . Neck: no obvious mass . Cardiovascular: S1 S2 normal no gallop . Respiratory: No rhonchi no rales are noted at this time . Abdomen: soft . Skin: no rash seen on limited exam . Musculoskeletal: not rigid . Psychiatric:unable to assess . Neurologic: no seizure no involuntary movements         Lab Data:   Basic Metabolic Panel: No results for input(s): NA, K, CL, CO2, GLUCOSE, BUN, CREATININE, CALCIUM, MG, PHOS in the last 168 hours.  ABG: No results for input(s): PHART, PCO2ART, PO2ART, HCO3, O2SAT in the last 168 hours.  Liver Function Tests: No results for input(s): AST, ALT, ALKPHOS, BILITOT, PROT, ALBUMIN in the last 168 hours. No results for input(s): LIPASE, AMYLASE in the last 168 hours. No results for input(s): AMMONIA in the last 168 hours.  CBC: No results for input(s): WBC, NEUTROABS, HGB, HCT, MCV, PLT in the last 168 hours.  Cardiac Enzymes: No results for input(s): CKTOTAL, CKMB, CKMBINDEX, TROPONINI in the last 168 hours.  BNP (last 3 results) No results for input(s): BNP in the last 8760 hours.  ProBNP (last 3 results) No results for input(s): PROBNP in the last 8760 hours.  Radiological Exams: No results  found.  Assessment/Plan Active Problems:   Acute on chronic respiratory failure with hypoxia (HCC)   COVID-19 virus infection   Pneumonia due to COVID-19 virus   Chronic systolic heart failure (HCC)   Sepsis due to Haemophilus influenzae with acute hypoxic respiratory failure and septic shock (HCC)   Chronic atrial fibrillation ([place])   1. Acute on chronic respiratory failure with hypoxia patient is doing well we will switch the trach out to a cuffless trach and begin with PMV 2. COVID-19 virus infection resolution phase 3. Pneumonia treated improving 4. Chronic systolic heart failure compensated 5. Sepsis resolved 6. Chronic atrial fibrillation rate controlled   I have personally seen and evaluated the patient, evaluated laboratory and imaging results, formulated the assessment and plan and placed orders. The Patient requires high complexity decision making for assessment and support.  Case was discussed on Rounds with the Respiratory Therapy Staff  Allyne Gee, MD [facility] Pulmonary Critical Care Medicine Sleep Medicine

## 2019-09-27 LAB — BLOOD GAS, ARTERIAL
Acid-Base Excess: 16.7 mmol/L — ABNORMAL HIGH (ref 0.0–2.0)
Acid-Base Excess: 16 mmol/L — ABNORMAL HIGH (ref 0.0–2.0)
Bicarbonate: 44 mmol/L — ABNORMAL HIGH (ref 20.0–28.0)
Bicarbonate: 41.3 mmol/L — ABNORMAL HIGH (ref 20.0–28.0)
FIO2: 40
FIO2: 40
O2 Saturation: 96.9 %
O2 Saturation: 94.7 %
Patient temperature: 36.1
Patient temperature: 37
pCO2 arterial: 93 mmHg (ref 32.0–48.0)
pCO2 arterial: 63.2 mmHg — ABNORMAL HIGH (ref 32.0–48.0)
pH, Arterial: 7.291 — ABNORMAL LOW (ref 7.350–7.450)
pH, Arterial: 7.431 (ref 7.350–7.450)
pO2, Arterial: 96.5 mmHg (ref 83.0–108.0)
pO2, Arterial: 68.8 mmHg — ABNORMAL LOW (ref 83.0–108.0)

## 2019-09-27 NOTE — Progress Notes (Signed)
Pulmonary Critical Care Medicine [place]   PULMONARY CRITICAL CARE SERVICE  PROGRESS NOTE  Date of Service: 09/27/2019  Sergio Mitchell  [id number]  DOB: [birth year]   DOA: 09/16/2019  Referring Physician: [name], MD  HPI: Sergio Mitchell is a 68 y.o. male seen for follow up of Acute on Chronic Respiratory Failure.  Patient currently is on the nag device has actually been doing well off the ventilator for 48 hours  Medications: Reviewed on Rounds  Physical Exam:  Vitals: Temperature 97.0 pulse 75 respiratory 16 blood pressure 115/70 saturations 96%  Ventilator Settings off the ventilator right now  . General: Comfortable at this time . Eyes: Grossly normal lids, irises & conjunctiva . ENT: grossly tongue is normal . Neck: no obvious mass . Cardiovascular: S1 S2 normal no gallop . Respiratory: No rhonchi no rales . Abdomen: soft . Skin: no rash seen on limited exam . Musculoskeletal: not rigid . Psychiatric:unable to assess . Neurologic: no seizure no involuntary movements         Lab Data:   Basic Metabolic Panel: No results for input(s): NA, K, CL, CO2, GLUCOSE, BUN, CREATININE, CALCIUM, MG, PHOS in the last 168 hours.  ABG: No results for input(s): PHART, PCO2ART, PO2ART, HCO3, O2SAT in the last 168 hours.  Liver Function Tests: No results for input(s): AST, ALT, ALKPHOS, BILITOT, PROT, ALBUMIN in the last 168 hours. No results for input(s): LIPASE, AMYLASE in the last 168 hours. No results for input(s): AMMONIA in the last 168 hours.  CBC: No results for input(s): WBC, NEUTROABS, HGB, HCT, MCV, PLT in the last 168 hours.  Cardiac Enzymes: No results for input(s): CKTOTAL, CKMB, CKMBINDEX, TROPONINI in the last 168 hours.  BNP (last 3 results) No results for input(s): BNP in the last 8760 hours.  ProBNP (last 3 results) No results for input(s): PROBNP in the last 8760 hours.  Radiological Exams: No results  found.  Assessment/Plan Active Problems:   Acute on chronic respiratory failure with hypoxia (HCC)   COVID-19 virus infection   Pneumonia due to COVID-19 virus   Chronic systolic heart failure (HCC)   Sepsis due to Haemophilus influenzae with acute hypoxic respiratory failure and septic shock (HCC)   Chronic atrial fibrillation ([place])   1. Acute on chronic respiratory failure hypoxia patient is doing well off the vent on the neck device 48 hours 2. COVID-19 virus infection resolved 3. Pneumonia treated improved 4. Sepsis improving 5. Chronic atrial fibrillation rate controlled   I have personally seen and evaluated the patient, evaluated laboratory and imaging results, formulated the assessment and plan and placed orders. The Patient requires high complexity decision making for assessment and support.  Case was discussed on Rounds with the Respiratory Therapy Staff  Allyne Gee, MD [facility] Pulmonary Critical Care Medicine Sleep Medicine

## 2019-09-28 LAB — BLOOD GAS, ARTERIAL
Acid-Base Excess: 16.3 mmol/L — ABNORMAL HIGH (ref 0.0–2.0)
Bicarbonate: 42.3 mmol/L — ABNORMAL HIGH (ref 20.0–28.0)
FIO2: 40
O2 Saturation: 95.8 %
Patient temperature: 36.9
pCO2 arterial: 73.5 mmHg (ref 32.0–48.0)
pH, Arterial: 7.378 (ref 7.350–7.450)
pO2, Arterial: 80.2 mmHg — ABNORMAL LOW (ref 83.0–108.0)

## 2019-09-28 NOTE — Progress Notes (Signed)
Pulmonary Critical Care Medicine [place]   PULMONARY CRITICAL CARE SERVICE  PROGRESS NOTE  Date of Service: 09/28/2019  CHRISTION LEONHARD  [id number]  DOB: 1951-11-02   DOA: 09/16/2019  Referring Physician: [name], MD  HPI: CARMAN ESSICK is a 68 y.o. male seen for follow up of Acute on Chronic Respiratory Failure.  Patient currently is on the nag device will try capping again had been back on BiPAP for increased PCO2 yesterday.  Now seems to be doing a little bit better  Medications: Reviewed on Rounds  Physical Exam:  Vitals: Temperature 97.3 pulse 86 respiratory rate 12 blood pressure 121/59 saturations 98%  Ventilator Settings patient was actually on the nag device this morning had increased PCO2 yesterday we will try to resume capping  . General: Comfortable at this time . Eyes: Grossly normal lids, irises & conjunctiva . ENT: grossly tongue is normal . Neck: no obvious mass . Cardiovascular: S1 S2 normal no gallop . Respiratory: No rhonchi no rales are noted at this time . Abdomen: soft . Skin: no rash seen on limited exam . Musculoskeletal: not rigid . Psychiatric:unable to assess . Neurologic: no seizure no involuntary movements         Lab Data:   Basic Metabolic Panel: No results for input(s): NA, K, CL, CO2, GLUCOSE, BUN, CREATININE, CALCIUM, MG, PHOS in the last 168 hours.  ABG: Recent Labs  Lab 09/27/19 1504 09/27/19 1700  PHART 7.291* 7.431  PCO2ART 93.0* 63.2*  PO2ART 96.5 68.8*  HCO3 44.0* 41.3*  O2SAT 96.9 94.7    Liver Function Tests: No results for input(s): AST, ALT, ALKPHOS, BILITOT, PROT, ALBUMIN in the last 168 hours. No results for input(s): LIPASE, AMYLASE in the last 168 hours. No results for input(s): AMMONIA in the last 168 hours.  CBC: No results for input(s): WBC, NEUTROABS, HGB, HCT, MCV, PLT in the last 168 hours.  Cardiac Enzymes: No results for input(s): CKTOTAL, CKMB, CKMBINDEX, TROPONINI in  the last 168 hours.  BNP (last 3 results) No results for input(s): BNP in the last 8760 hours.  ProBNP (last 3 results) No results for input(s): PROBNP in the last 8760 hours.  Radiological Exams: No results found.  Assessment/Plan Active Problems:   Acute on chronic respiratory failure with hypoxia (HCC)   COVID-19 virus infection   Pneumonia due to COVID-19 virus   Chronic systolic heart failure (HCC)   Sepsis due to Haemophilus influenzae with acute hypoxic respiratory failure and septic shock (HCC)   Chronic atrial fibrillation ([place])   1. Acute on chronic respiratory failure with hypoxia patient is going to continue with advancing on the weight 2. COVID-19 virus infection resolved 3. Pneumonia due to COVID-19 slowly improving 4. Chronic systolic heart failure monitor fluids 5. Sepsis secondary to Haemophilus treated 6. Chronic atrial fibrillation rate controlled   I have personally seen and evaluated the patient, evaluated laboratory and imaging results, formulated the assessment and plan and placed orders. The Patient requires high complexity decision making for assessment and support.  Case was discussed on Rounds with the Respiratory Therapy Staff  Allyne Gee, MD [facility] Pulmonary Critical Care Medicine Sleep Medicine

## 2019-09-29 ENCOUNTER — Other Ambulatory Visit (HOSPITAL_COMMUNITY): Payer: Medicare Other

## 2019-09-29 LAB — BLOOD GAS, ARTERIAL
Acid-Base Excess: 18.2 mmol/L — ABNORMAL HIGH (ref 0.0–2.0)
Bicarbonate: 44.1 mmol/L — ABNORMAL HIGH (ref 20.0–28.0)
FIO2: 44
O2 Saturation: 96.9 %
Patient temperature: 36.1
pCO2 arterial: 68.3 mmHg (ref 32.0–48.0)
pH, Arterial: 7.421 (ref 7.350–7.450)
pO2, Arterial: 80.3 mmHg — ABNORMAL LOW (ref 83.0–108.0)
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
## 2019-09-30 LAB — CBC
HCT: 43.2 % (ref 39.0–52.0)
Hemoglobin: 13.7 g/dL (ref 13.0–17.0)
MCH: 32.2 pg (ref 26.0–34.0)
MCHC: 31.7 g/dL (ref 30.0–36.0)
MCV: 101.6 fL — ABNORMAL HIGH (ref 80.0–100.0)
Platelets: 329 10*3/uL (ref 150–400)
RBC: 4.25 MIL/uL (ref 4.22–5.81)
RDW: 14.6 % (ref 11.5–15.5)
WBC: 7 10*3/uL (ref 4.0–10.5)
nRBC: 0 % (ref 0.0–0.2)

## 2019-09-30 LAB — BASIC METABOLIC PANEL
Anion gap: 9 (ref 5–15)
BUN: 49 mg/dL — ABNORMAL HIGH (ref 8–23)
CO2: 42 mmol/L — ABNORMAL HIGH (ref 22–32)
Calcium: 10.4 mg/dL — ABNORMAL HIGH (ref 8.9–10.3)
Chloride: 101 mmol/L (ref 98–111)
Creatinine, Ser: 1.03 mg/dL (ref 0.61–1.24)
GFR calc Af Amer: >60 mL/min (ref >60)
GFR calc non Af Amer: >60 mL/min (ref >60)
Glucose, Bld: 125 mg/dL — ABNORMAL HIGH (ref 70–99)
Potassium: 4.1 mmol/L (ref 3.5–5.1)
Sodium: 152 mmol/L — ABNORMAL HIGH (ref 135–145)
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
## 2019-10-01 LAB — CULTURE, RESPIRATORY W GRAM STAIN

## 2019-10-01 NOTE — Progress Notes (Addendum)
Pulmonary Critical Care Medicine [name]   PULMONARY CRITICAL CARE SERVICE  PROGRESS NOTE  Date of Service: 10/01/2019  BAYLEN BUCKNER  [id number]  DOB: 12-11-1950   DOA: 09/16/2019  Referring Physician: [name], MD  HPI: Sergio Mitchell is a 68 y.o. male seen for follow up of Acute on Chronic Respiratory Failure.  Patient remains on 10 L NAG has increased secretions and agitation so unable to wean this time.  Medications: Reviewed on Rounds  Physical Exam:  Vitals: Pulse 70 respirations 20 BP 122/70 O2 sat 97% temp 98.9  Ventilator Settings 10 L NAG  . General: Comfortable at this time . Eyes: Grossly normal lids, irises & conjunctiva . ENT: grossly tongue is normal . Neck: no obvious mass . Cardiovascular: S1 S2 normal no gallop . Respiratory: No rales or rhonchi noted . Abdomen: soft . Skin: no rash seen on limited exam . Musculoskeletal: not rigid . Psychiatric:unable to assess . Neurologic: no seizure no involuntary movements         Lab Data:   Basic Metabolic Panel: Recent Labs  Lab 09/30/19 0457  NA 152*  K 4.1  CL 101  CO2 42*  GLUCOSE 125*  BUN 49*  CREATININE 1.03  CALCIUM 10.4*    ABG: Recent Labs  Lab 09/27/19 1504 09/27/19 1700 09/28/19 1100 09/29/19 0800  PHART 7.291* 7.431 7.378 7.421  PCO2ART 93.0* 63.2* 73.5* 68.3*  PO2ART 96.5 68.8* 80.2* 80.3*  HCO3 44.0* 41.3* 42.3* 44.1*  O2SAT 96.9 94.7 95.8 96.9    Liver Function Tests: No results for input(s): AST, ALT, ALKPHOS, BILITOT, PROT, ALBUMIN in the last 168 hours. No results for input(s): LIPASE, AMYLASE in the last 168 hours. No results for input(s): AMMONIA in the last 168 hours.  CBC: Recent Labs  Lab 09/30/19 0457  WBC 7.0  HGB 13.7  HCT 43.2  MCV 101.6*  PLT 329    Cardiac Enzymes: No results for input(s): CKTOTAL, CKMB, CKMBINDEX, TROPONINI in the last 168 hours.  BNP (last 3 results) No results for input(s): BNP in the last  8760 hours.  ProBNP (last 3 results) No results for input(s): PROBNP in the last 8760 hours.  Radiological Exams: No results found.  Assessment/Plan Active Problems:   Acute on chronic respiratory failure with hypoxia (HCC)   COVID-19 virus infection   Pneumonia due to COVID-19 virus   Chronic systolic heart failure (HCC)   Sepsis due to Haemophilus influenzae with acute hypoxic respiratory failure and septic shock (HCC)   Chronic atrial fibrillation ([place])   1. Acute on chronic respiratory failure with hypoxia patient will continue on NAG continue supportive measures and pulmonary toilet. 2. COVID-19 virus infection resolved 3. Pneumonia due to COVID-19 slowly improving 4. Chronic systolic heart failure monitor fluids 5. Sepsis secondary to Haemophilus treated 6. Chronic atrial fibrillation rate controlled   I have personally seen and evaluated the patient, evaluated laboratory and imaging results, formulated the assessment and plan and placed orders. The Patient requires high complexity decision making for assessment and support.  Case was discussed on Rounds with the Respiratory Therapy Staff  Allyne Gee, MD [facility] Pulmonary Critical Care Medicine Sleep Medicine

## 2019-10-02 LAB — BASIC METABOLIC PANEL
Anion gap: 11 (ref 5–15)
BUN: 45 mg/dL — ABNORMAL HIGH (ref 8–23)
CO2: 42 mmol/L — ABNORMAL HIGH (ref 22–32)
Calcium: 10.6 mg/dL — ABNORMAL HIGH (ref 8.9–10.3)
Chloride: 97 mmol/L — ABNORMAL LOW (ref 98–111)
Creatinine, Ser: 1.18 mg/dL (ref 0.61–1.24)
GFR calc Af Amer: >60 mL/min (ref >60)
GFR calc non Af Amer: >60 mL/min (ref >60)
Glucose, Bld: 121 mg/dL — ABNORMAL HIGH (ref 70–99)
Potassium: 3.9 mmol/L (ref 3.5–5.1)
Sodium: 150 mmol/L — ABNORMAL HIGH (ref 135–145)

## 2019-10-02 LAB — CBC
HCT: 43.6 % (ref 39.0–52.0)
Hemoglobin: 13.3 g/dL (ref 13.0–17.0)
MCH: 31.5 pg (ref 26.0–34.0)
MCHC: 30.5 g/dL (ref 30.0–36.0)
MCV: 103.3 fL — ABNORMAL HIGH (ref 80.0–100.0)
Platelets: 367 10*3/uL (ref 150–400)
RBC: 4.22 MIL/uL (ref 4.22–5.81)
RDW: 15 % (ref 11.5–15.5)
WBC: 7.6 10*3/uL (ref 4.0–10.5)
nRBC: 0 % (ref 0.0–0.2)

## 2019-10-02 NOTE — Progress Notes (Addendum)
Pulmonary Critical Care Medicine [place]   PULMONARY CRITICAL CARE SERVICE  PROGRESS NOTE  Date of Service: 10/02/2019  Sergio Mitchell  [id number]  DOB: 1951-03-15   DOA: 09/16/2019  Referring Physician: [name], MD  HPI: Sergio Mitchell is a 68 y.o. male seen for follow up of Acute on Chronic Respiratory Failure.  Patient remains on 7 L NAG satting well no fever distress.  Medications: Reviewed on Rounds  Physical Exam:  Vitals: Pulse 79 respirations 18 BP 126/71 O2 sat 1% temp 98.5  Ventilator Settings NAG 7 L  . General: Comfortable at this time . Eyes: Grossly normal lids, irises & conjunctiva . ENT: grossly tongue is normal . Neck: no obvious mass . Cardiovascular: S1 S2 normal no gallop . Respiratory: No rales or rhonchi noted . Abdomen: soft . Skin: no rash seen on limited exam . Musculoskeletal: not rigid . Psychiatric:unable to assess . Neurologic: no seizure no involuntary movements         Lab Data:   Basic Metabolic Panel: Recent Labs  Lab 09/30/19 0457 10/02/19 0606  NA 152* 150*  K 4.1 3.9  CL 101 97*  CO2 42* 42*  GLUCOSE 125* 121*  BUN 49* 45*  CREATININE 1.03 1.18  CALCIUM 10.4* 10.6*    ABG: Recent Labs  Lab 09/27/19 1504 09/27/19 1700 09/28/19 1100 09/29/19 0800  PHART 7.291* 7.431 7.378 7.421  PCO2ART 93.0* 63.2* 73.5* 68.3*  PO2ART 96.5 68.8* 80.2* 80.3*  HCO3 44.0* 41.3* 42.3* 44.1*  O2SAT 96.9 94.7 95.8 96.9    Liver Function Tests: No results for input(s): AST, ALT, ALKPHOS, BILITOT, PROT, ALBUMIN in the last 168 hours. No results for input(s): LIPASE, AMYLASE in the last 168 hours. No results for input(s): AMMONIA in the last 168 hours.  CBC: Recent Labs  Lab 09/30/19 0457 10/02/19 0606  WBC 7.0 7.6  HGB 13.7 13.3  HCT 43.2 43.6  MCV 101.6* 103.3*  PLT 329 367    Cardiac Enzymes: No results for input(s): CKTOTAL, CKMB, CKMBINDEX, TROPONINI in the last 168 hours.  BNP (last 3  results) No results for input(s): BNP in the last 8760 hours.  ProBNP (last 3 results) No results for input(s): PROBNP in the last 8760 hours.  Radiological Exams: No results found.  Assessment/Plan Active Problems:   Acute on chronic respiratory failure with hypoxia (HCC)   COVID-19 virus infection   Pneumonia due to COVID-19 virus   Chronic systolic heart failure (HCC)   Sepsis due to Haemophilus influenzae with acute hypoxic respiratory failure and septic shock (HCC)   Chronic atrial fibrillation ([place])   1. Acute on chronic respiratory failure with hypoxiapatient will continue on NAG continue supportive measures and pulmonary toilet. 2. COVID-19 virus infection resolved 3. Pneumonia due to COVID-19 slowly improving 4. Chronic systolic heart failure monitor fluids 5. Sepsis secondary to Haemophilus treated 6. Chronic atrial fibrillation rate controlled   I have personally seen and evaluated the patient, evaluated laboratory and imaging results, formulated the assessment and plan and placed orders. The Patient requires high complexity decision making for assessment and support.  Case was discussed on Rounds with the Respiratory Therapy Staff  Allyne Gee, MD [facility] Pulmonary Critical Care Medicine Sleep Medicine

## 2019-10-03 NOTE — Progress Notes (Signed)
Pulmonary Critical Care Medicine [place]   PULMONARY CRITICAL CARE SERVICE  PROGRESS NOTE  Date of Service: 10/03/2019  Sergio Mitchell  URK:[id number]  DOB: 18-Jun-1951   DOA: 09/16/2019  Referring Physician: [name], MD  HPI: Sergio Mitchell is a 68 y.o. male seen for follow up of Acute on Chronic Respiratory Failure.  Patient is off the ventilator on the nag device  Medications: Reviewed on Rounds  Physical Exam:  Vitals: Temperature 97.7 pulse 88 respiratory rate 15 blood pressure 115/60 saturations 93%  Ventilator Settings off the ventilator right now on the nag device with 6 L flow  . General: Comfortable at this time . Eyes: Grossly normal lids, irises & conjunctiva . ENT: grossly tongue is normal . Neck: no obvious mass . Cardiovascular: S1 S2 normal no gallop . Respiratory: No rhonchi no rales are noted at this time . Abdomen: soft . Skin: no rash seen on limited exam . Musculoskeletal: not rigid . Psychiatric:unable to assess . Neurologic: no seizure no involuntary movements         Lab Data:   Basic Metabolic Panel: Recent Labs  Lab 09/30/19 0457 10/02/19 0606  NA 152* 150*  K 4.1 3.9  CL 101 97*  CO2 42* 42*  GLUCOSE 125* 121*  BUN 49* 45*  CREATININE 1.03 1.18  CALCIUM 10.4* 10.6*    ABG: Recent Labs  Lab 09/27/19 1504 09/27/19 1700 09/28/19 1100 09/29/19 0800  PHART 7.291* 7.431 7.378 7.421  PCO2ART 93.0* 63.2* 73.5* 68.3*  PO2ART 96.5 68.8* 80.2* 80.3*  HCO3 44.0* 41.3* 42.3* 44.1*  O2SAT 96.9 94.7 95.8 96.9    Liver Function Tests: No results for input(s): AST, ALT, ALKPHOS, BILITOT, PROT, ALBUMIN in the last 168 hours. No results for input(s): LIPASE, AMYLASE in the last 168 hours. No results for input(s): AMMONIA in the last 168 hours.  CBC: Recent Labs  Lab 09/30/19 0457 10/02/19 0606  WBC 7.0 7.6  HGB 13.7 13.3  HCT 43.2 43.6  MCV 101.6* 103.3*  PLT 329 367    Cardiac Enzymes: No results  for input(s): CKTOTAL, CKMB, CKMBINDEX, TROPONINI in the last 168 hours.  BNP (last 3 results) No results for input(s): BNP in the last 8760 hours.  ProBNP (last 3 results) No results for input(s): PROBNP in the last 8760 hours.  Radiological Exams: No results found.  Assessment/Plan Active Problems:   Acute on chronic respiratory failure with hypoxia (HCC)   COVID-19 virus infection   Pneumonia due to COVID-19 virus   Chronic systolic heart failure (HCC)   Sepsis due to Haemophilus influenzae with acute hypoxic respiratory failure and septic shock (HCC)   Chronic atrial fibrillation ([place])   1. Acute on chronic respiratory failure with hypoxia we will continue with oxygenation as tolerated titrate down as tolerated 2. COVID-19 virus infection in resolution phase 3. Pneumonia due to COVID-19 improving 4. Chronic systolic heart failure at baseline 5. Severe sepsis.  Resolved 6. Chronic atrial fibrillation rate is controlled   I have personally seen and evaluated the patient, evaluated laboratory and imaging results, formulated the assessment and plan and placed orders. The Patient requires high complexity decision making for assessment and support.  Case was discussed on Rounds with the Respiratory Therapy Staff  Allyne Gee, MD [facility] Pulmonary Critical Care Medicine Sleep Medicine

## 2019-10-04 ENCOUNTER — Other Ambulatory Visit (HOSPITAL_COMMUNITY): Payer: Medicare Other

## 2019-10-04 LAB — BASIC METABOLIC PANEL
Anion gap: 11 (ref 5–15)
BUN: 46 mg/dL — ABNORMAL HIGH (ref 8–23)
CO2: 42 mmol/L — ABNORMAL HIGH (ref 22–32)
Calcium: 10.4 mg/dL — ABNORMAL HIGH (ref 8.9–10.3)
Chloride: 99 mmol/L (ref 98–111)
Creatinine, Ser: 1.07 mg/dL (ref 0.61–1.24)
GFR calc Af Amer: >60 mL/min (ref >60)
GFR calc non Af Amer: >60 mL/min (ref >60)
Glucose, Bld: 110 mg/dL — ABNORMAL HIGH (ref 70–99)
Potassium: 3.3 mmol/L — ABNORMAL LOW (ref 3.5–5.1)
Sodium: 152 mmol/L — ABNORMAL HIGH (ref 135–145)

## 2019-10-04 NOTE — Progress Notes (Signed)
Pulmonary Critical Care Medicine [place]   PULMONARY CRITICAL CARE SERVICE  PROGRESS NOTE  Date of Service: 10/04/2019  Sergio Mitchell  CZY:[id number]  DOB: 03/06/1951   DOA: 09/16/2019  Referring Physician: [name], MD  HPI: Sergio Mitchell is a 68 y.o. male seen for follow up of Acute on Chronic Respiratory Failure.  He remains off the ventilator right now is on nag device on 8 L oxygen chest x-ray had been showing significant infiltrates still  Medications: Reviewed on Rounds  Physical Exam:  Vitals: Temperature 96.8 pulse 90 respiratory 28 blood pressure 130/73 saturations 97%  Ventilator Settings off the ventilator right now  . General: Comfortable at this time . Eyes: Grossly normal lids, irises & conjunctiva . ENT: grossly tongue is normal . Neck: no obvious mass . Cardiovascular: S1 S2 normal no gallop . Respiratory: No rhonchi no rales are noted . Abdomen: soft . Skin: no rash seen on limited exam . Musculoskeletal: not rigid . Psychiatric:unable to assess . Neurologic: no seizure no involuntary movements         Lab Data:   Basic Metabolic Panel: Recent Labs  Lab 09/30/19 0457 10/02/19 0606 10/04/19 0329  NA 152* 150* 152*  K 4.1 3.9 3.3*  CL 101 97* 99  CO2 42* 42* 42*  GLUCOSE 125* 121* 110*  BUN 49* 45* 46*  CREATININE 1.03 1.18 1.07  CALCIUM 10.4* 10.6* 10.4*    ABG: Recent Labs  Lab 09/27/19 1504 09/27/19 1700 09/28/19 1100 09/29/19 0800  PHART 7.291* 7.431 7.378 7.421  PCO2ART 93.0* 63.2* 73.5* 68.3*  PO2ART 96.5 68.8* 80.2* 80.3*  HCO3 44.0* 41.3* 42.3* 44.1*  O2SAT 96.9 94.7 95.8 96.9    Liver Function Tests: No results for input(s): AST, ALT, ALKPHOS, BILITOT, PROT, ALBUMIN in the last 168 hours. No results for input(s): LIPASE, AMYLASE in the last 168 hours. No results for input(s): AMMONIA in the last 168 hours.  CBC: Recent Labs  Lab 09/30/19 0457 10/02/19 0606  WBC 7.0 7.6  HGB 13.7 13.3   HCT 43.2 43.6  MCV 101.6* 103.3*  PLT 329 367    Cardiac Enzymes: No results for input(s): CKTOTAL, CKMB, CKMBINDEX, TROPONINI in the last 168 hours.  BNP (last 3 results) No results for input(s): BNP in the last 8760 hours.  ProBNP (last 3 results) No results for input(s): PROBNP in the last 8760 hours.  Radiological Exams: No results found.  Assessment/Plan Active Problems:   Acute on chronic respiratory failure with hypoxia (HCC)   COVID-19 virus infection   Pneumonia due to COVID-19 virus   Chronic systolic heart failure (HCC)   Sepsis due to Haemophilus influenzae with acute hypoxic respiratory failure and septic shock (HCC)   Chronic atrial fibrillation ([place])   1. Acute on chronic respiratory failure hypoxia we will continue with oxygen therapy I have asked for a repeat chest x-ray to look at the patient's progression of the infiltrates 2. COVID-19 virus infection in resolution phase 3. Chronic systolic heart failure compensated 4. Pneumonia treated follow-up x-ray ordered 5. Sepsis resolved 6. Chronic atrial fibrillation rate controlled   I have personally seen and evaluated the patient, evaluated laboratory and imaging results, formulated the assessment and plan and placed orders. The Patient requires high complexity decision making for assessment and support.  Case was discussed on Rounds with the Respiratory Therapy Staff  Allyne Gee, MD [facility] Pulmonary Critical Care Medicine Sleep Medicine

## 2019-10-05 LAB — BASIC METABOLIC PANEL
Anion gap: 10 (ref 5–15)
BUN: 45 mg/dL — ABNORMAL HIGH (ref 8–23)
CO2: 41 mmol/L — ABNORMAL HIGH (ref 22–32)
Calcium: 10.4 mg/dL — ABNORMAL HIGH (ref 8.9–10.3)
Chloride: 101 mmol/L (ref 98–111)
Creatinine, Ser: 1.02 mg/dL (ref 0.61–1.24)
GFR calc Af Amer: >60 mL/min (ref >60)
GFR calc non Af Amer: >60 mL/min (ref >60)
Glucose, Bld: 153 mg/dL — ABNORMAL HIGH (ref 70–99)
Potassium: 3.3 mmol/L — ABNORMAL LOW (ref 3.5–5.1)
Sodium: 152 mmol/L — ABNORMAL HIGH (ref 135–145)

## 2019-10-05 NOTE — Progress Notes (Signed)
Pulmonary Critical Care Medicine [place]   PULMONARY CRITICAL CARE SERVICE  PROGRESS NOTE  Date of Service: 10/05/2019  BAO BAZEN  [id number]  DOB: 02/27/1951   DOA: 09/16/2019  Referring Physician: [name], MD  HPI: Sergio Mitchell is a 68 y.o. male seen for follow up of Acute on Chronic Respiratory Failure.  Patient currently is off the ventilator right now on a nag device has been having issues with desaturations.  Reviewed the chest x-ray some possibility of some atelectasis developing along with some fluid  Medications: Reviewed on Rounds  Physical Exam:  Vitals: Temperature 97.4 pulse 75 respiratory 18 blood pressure 125/69 saturations 95%  Ventilator Settings off the vent on nag device  . General: Comfortable at this time . Eyes: Grossly normal lids, irises & conjunctiva . ENT: grossly tongue is normal . Neck: no obvious mass . Cardiovascular: S1 S2 normal no gallop . Respiratory: No rhonchi no rales are noted at this time . Abdomen: soft . Skin: no rash seen on limited exam . Musculoskeletal: not rigid . Psychiatric:unable to assess . Neurologic: no seizure no involuntary movements         Lab Data:   Basic Metabolic Panel: Recent Labs  Lab 09/30/19 0457 10/02/19 0606 10/04/19 0329 10/05/19 0449  NA 152* 150* 152* 152*  K 4.1 3.9 3.3* 3.3*  CL 101 97* 99 101  CO2 42* 42* 42* 41*  GLUCOSE 125* 121* 110* 153*  BUN 49* 45* 46* 45*  CREATININE 1.03 1.18 1.07 1.02  CALCIUM 10.4* 10.6* 10.4* 10.4*    ABG: Recent Labs  Lab 09/28/19 1100 09/29/19 0800  PHART 7.378 7.421  PCO2ART 73.5* 68.3*  PO2ART 80.2* 80.3*  HCO3 42.3* 44.1*  O2SAT 95.8 96.9    Liver Function Tests: No results for input(s): AST, ALT, ALKPHOS, BILITOT, PROT, ALBUMIN in the last 168 hours. No results for input(s): LIPASE, AMYLASE in the last 168 hours. No results for input(s): AMMONIA in the last 168 hours.  CBC: Recent Labs  Lab  09/30/19 0457 10/02/19 0606  WBC 7.0 7.6  HGB 13.7 13.3  HCT 43.2 43.6  MCV 101.6* 103.3*  PLT 329 367    Cardiac Enzymes: No results for input(s): CKTOTAL, CKMB, CKMBINDEX, TROPONINI in the last 168 hours.  BNP (last 3 results) No results for input(s): BNP in the last 8760 hours.  ProBNP (last 3 results) No results for input(s): PROBNP in the last 8760 hours.  Radiological Exams: Dg Chest Port 1 View  Result Date: 10/04/2019 CLINICAL DATA:  Pneumonia. EXAM: PORTABLE CHEST 1 VIEW COMPARISON:  September 29, 2019 FINDINGS: A tracheostomy tube remains in place. No pneumothorax. Opacity in the medial right lung base is stable. Opacity in the retrocardiac region is poorly assessed due the lack of a lateral view but appears similar to mildly more pronounced in the interval. IMPRESSION: The tracheostomy tube is in good position. The medial right basilar infiltrate is stable. Opacity in left retrocardiac region is poorly assessed but likely stable to mildly more prominent in the interval. Electronically Signed   By: Dorise Bullion III M.D   On: 10/04/2019 10:37    Assessment/Plan Active Problems:   Acute on chronic respiratory failure with hypoxia ([place])   COVID-19 virus infection   Pneumonia due to COVID-19 virus   Chronic systolic heart failure (HCC)   Sepsis due to Haemophilus influenzae with acute hypoxic respiratory failure and septic shock (HCC)   Chronic atrial fibrillation ([place])   1.  Acute on chronic respiratory failure with hypoxia we will continue with oxygenation titrate down as tolerated saturations right now were good asked for respiratory therapy to wean the FiO2 down. 2. COVID-19 virus infection in the recovery phase patient still is having some residual deficits noted tomorrow looks more like atelectasis 3. Pneumonia due to COVID-19 treated 4. Chronic systolic heart failure monitor fluid status has any reaccumulating pleural effusion possibly on the left side 5. Sepsis  treated we will continue supportive care 6. Chronic atrial fibrillation rate is controlled at this time   I have personally seen and evaluated the patient, evaluated laboratory and imaging results, formulated the assessment and plan and placed orders. The Patient requires high complexity decision making for assessment and support.  Case was discussed on Rounds with the Respiratory Therapy Staff  Yevonne Pax, MD [facility] Pulmonary Critical Care Medicine Sleep Medicine

## 2019-10-06 ENCOUNTER — Institutional Professional Consult (permissible substitution) (HOSPITAL_COMMUNITY): Payer: Medicare Other

## 2019-10-06 LAB — POTASSIUM: Potassium: 3.7 mmol/L (ref 3.5–5.1)

## 2019-10-06 MED ORDER — IOHEXOL 300 MG/ML  SOLN: 75.0000 mL | Freq: Once | INTRAMUSCULAR | Status: DC | PRN

## 2019-10-06 NOTE — Progress Notes (Signed)
Pt came to CT for a CT Chest with IV contrast. I flushed one of the ports on his RIGHT hand IV, and all seemed fine. I proceeded with scan, but saw no contrast on the images. I checked pt.s hand for signs of extravasation. Pt.s hand was normal sized, pain-free. I did a CT Image of his entire arm to see if contrast had extravasated. Again, there was no extravasation. I spoke to Dr Jobe Igo about the scan; he looked at my images, and suggested that I give more IV contrast, and re scan pt. I decided to hand-inject the IV contrast. When I closed the port not in use, the "other" port would not flush at all. If the port was re-opened, it would flush fine, but the NS was just coming out of the other port.  After verifying IV dysfunction, pt was returned to his room.

## 2019-10-06 NOTE — Progress Notes (Signed)
Pulmonary Critical Care Medicine [facility] [place]   PULMONARY CRITICAL CARE SERVICE  PROGRESS NOTE  Date of Service: 10/06/2019  Sergio Mitchell  [id number]:[id number]  DOB: 1951-05-23   DOA: 09/16/2019  Referring Physician: Carron Curie, MD  HPI: Sergio Mitchell is a 68 y.o. male seen for follow up of Acute on Chronic Respiratory Failure.  Patient had a CT scan done which is showing some residual pneumonia on the right side but there is more pronounced on the left lower lobe  Medications: Reviewed on Rounds  Physical Exam:  Vitals: Temperature 96.7 pulse 81 respiratory rate 16 blood pressure 113/67 saturations 92%  Ventilator Settings off the ventilator right now on the nag device  . General: Comfortable at this time . Eyes: Grossly normal lids, irises & conjunctiva . ENT: grossly tongue is normal . Neck: no obvious mass . Cardiovascular: S1 S2 normal no gallop . Respiratory: No rhonchi no rales are noted at this time . Abdomen: soft . Skin: no rash seen on limited exam . Musculoskeletal: not rigid . Psychiatric:unable to assess . Neurologic: no seizure no involuntary movements         Lab Data:   Basic Metabolic Panel: Recent Labs  Lab 09/30/19 0457 10/02/19 0606 10/04/19 0329 10/05/19 0449 10/06/19 0959  NA 152* 150* 152* 152*  --   K 4.1 3.9 3.3* 3.3* 3.7  CL 101 97* 99 101  --   CO2 42* 42* 42* 41*  --   GLUCOSE 125* 121* 110* 153*  --   BUN 49* 45* 46* 45*  --   CREATININE 1.03 1.18 1.07 1.02  --   CALCIUM 10.4* 10.6* 10.4* 10.4*  --     ABG: No results for input(s): PHART, PCO2ART, PO2ART, HCO3, O2SAT in the last 168 hours.  Liver Function Tests: No results for input(s): AST, ALT, ALKPHOS, BILITOT, PROT, ALBUMIN in the last 168 hours. No results for input(s): LIPASE, AMYLASE in the last 168 hours. No results for input(s): AMMONIA in the last 168 hours.  CBC: Recent Labs  Lab 09/30/19 0457 10/02/19 0606  WBC 7.0 7.6  HGB 13.7 13.3  HCT  43.2 43.6  MCV 101.6* 103.3*  PLT 329 367    Cardiac Enzymes: No results for input(s): CKTOTAL, CKMB, CKMBINDEX, TROPONINI in the last 168 hours.  BNP (last 3 results) No results for input(s): BNP in the last 8760 hours.  ProBNP (last 3 results) No results for input(s): PROBNP in the last 8760 hours.  Radiological Exams: Ct Chest Wo Contrast  Result Date: 10/06/2019 CLINICAL DATA:  Pleural effusion. Recovering from COVID-19. Progressive hypoxia. Mid back pain. The study was ordered as CT chest with contrast. 75 mL Omnipaque 300 was injected, but is not evident on the images. There is no evidence for infiltration. Malfunction of a dual port catheter was noted. The contrast was found external to the patient. The IV is inadequate for IV contrast injection. EXAM: CT CHEST WITHOUT CONTRAST TECHNIQUE: Multidetector CT imaging of the chest was performed following the standard protocol without IV contrast. COMPARISON:  CTA chest 09/03/2019. One-view chest x-ray 08/04/2019 FINDINGS: Cardiovascular: The heart size is normal. Aorta is within normal limits. There is mild enlargement of the pulmonary arteries, as before. Mediastinum/Nodes: Calcified distal paratracheal and subcarinal nodes are again seen. Granulomas are present in the left lower lobe. Esophagus is mildly dilated. Tracheostomy tube is in place. Lungs/Pleura: Left lower lobe airspace consolidation is present. There is patchy airspace disease in the lingula. Ground-glass  attenuation anteriorly in the right upper lobe is stable. Airspace disease at the right base has increased since the prior CT. It is less prominent than on the left. Upper Abdomen: Within normal limits. Granulomatous changes are present in the spleen. Musculoskeletal: Vertebral body heights maintained. There is stable curvature of the thoracic spine. IMPRESSION: 1. Left lower lobe and lingular airspace disease compatible with pneumonia. 2. Right lower lobe airspace disease is also  noted, likely representing infection. 3. Stable ground-glass attenuation in the anterior right upper lobe. This may represent chronic scarring. 4. Stable mild enlargement of the pulmonary arteries suggesting pulmonary arterial hypertension. 5. Tracheostomy tube in place. 6. Stable curvature of the thoracic spine. 7. Aortic Atherosclerosis (ICD10-I70.0). 8. IV contrast was not given due to IV failure. Electronically Signed   By: San Morelle M.D.   On: 10/06/2019 07:59    Assessment/Plan Active Problems:   Acute on chronic respiratory failure with hypoxia (HCC)   COVID-19 virus infection   Pneumonia due to COVID-19 virus   Chronic systolic heart failure (HCC)   Sepsis due to Haemophilus influenzae with acute hypoxic respiratory failure and septic shock (HCC)   Chronic atrial fibrillation ([place])   1. Acute on chronic respiratory failure hypoxia we will continue to try to wean FiO2 down patient requiring 10 L at this time still has significant changes on the CT as already noted above. 2. COVID-19 virus infection in resolution phase 3. Pneumonia due to COVID-19 treated still with residual airspace disease noted 4. Chronic systolic heart failure monitor fluid status 5. Severe sepsis secondary to Haemophilus influenzae continue present management 6. Chronic atrial fibrillation rate is controlled at this time   I have personally seen and evaluated the patient, evaluated laboratory and imaging results, formulated the assessment and plan and placed orders. The Patient requires high complexity decision making for assessment and support.  Case was discussed on Rounds with the Respiratory Therapy Staff  Allyne Gee, MD [facility] Pulmonary Critical Care Medicine Sleep Medicine

## 2019-10-07 ENCOUNTER — Other Ambulatory Visit (HOSPITAL_COMMUNITY): Payer: Medicare Other

## 2019-10-07 NOTE — Progress Notes (Addendum)
Pulmonary Critical Care Medicine [facility] [place]   PULMONARY CRITICAL CARE SERVICE  PROGRESS NOTE  Date of Service: 10/07/2019  [name]  VOZ:[id number]  DOB: 09-06-1951   DOA: 09/16/2019  Referring Physician: Carron Curie, MD  HPI: Sergio Mitchell is a 68 y.o. male seen for follow up of Acute on Chronic Respiratory Failure.  Patient remains on NAG 8 L using PMV with no difficulty  Medications: Reviewed on Rounds  Physical Exam:  Vitals: Pulse 78 respirations 20 BP 119/58 O2 sat 97% temp 98.0  Ventilator Settings NAG 8 L  . General: Comfortable at this time . Eyes: Grossly normal lids, irises & conjunctiva . ENT: grossly tongue is normal . Neck: no obvious mass . Cardiovascular: S1 S2 normal no gallop . Respiratory: No rales or rhonchi noted . Abdomen: soft . Skin: no rash seen on limited exam . Musculoskeletal: not rigid . Psychiatric:unable to assess . Neurologic: no seizure no involuntary movements         Lab Data:   Basic Metabolic Panel: Recent Labs  Lab 10/02/19 0606 10/04/19 0329 10/05/19 0449 10/06/19 0959  NA 150* 152* 152*  --   K 3.9 3.3* 3.3* 3.7  CL 97* 99 101  --   CO2 42* 42* 41*  --   GLUCOSE 121* 110* 153*  --   BUN 45* 46* 45*  --   CREATININE 1.18 1.07 1.02  --   CALCIUM 10.6* 10.4* 10.4*  --     ABG: No results for input(s): PHART, PCO2ART, PO2ART, HCO3, O2SAT in the last 168 hours.  Liver Function Tests: No results for input(s): AST, ALT, ALKPHOS, BILITOT, PROT, ALBUMIN in the last 168 hours. No results for input(s): LIPASE, AMYLASE in the last 168 hours. No results for input(s): AMMONIA in the last 168 hours.  CBC: Recent Labs  Lab 10/02/19 0606  WBC 7.6  HGB 13.3  HCT 43.6  MCV 103.3*  PLT 367    Cardiac Enzymes: No results for input(s): CKTOTAL, CKMB, CKMBINDEX, TROPONINI in the last 168 hours.  BNP (last 3 results) No results for input(s): BNP in the last 8760 hours.  ProBNP (last 3  results) No results for input(s): PROBNP in the last 8760 hours.  Radiological Exams: Ct Chest Wo Contrast  Result Date: 10/06/2019 CLINICAL DATA:  Pleural effusion. Recovering from COVID-19. Progressive hypoxia. Mid back pain. The study was ordered as CT chest with contrast. 75 mL Omnipaque 300 was injected, but is not evident on the images. There is no evidence for infiltration. Malfunction of a dual port catheter was noted. The contrast was found external to the patient. The IV is inadequate for IV contrast injection. EXAM: CT CHEST WITHOUT CONTRAST TECHNIQUE: Multidetector CT imaging of the chest was performed following the standard protocol without IV contrast. COMPARISON:  CTA chest 09/03/2019. One-view chest x-ray 08/04/2019 FINDINGS: Cardiovascular: The heart size is normal. Aorta is within normal limits. There is mild enlargement of the pulmonary arteries, as before. Mediastinum/Nodes: Calcified distal paratracheal and subcarinal nodes are again seen. Granulomas are present in the left lower lobe. Esophagus is mildly dilated. Tracheostomy tube is in place. Lungs/Pleura: Left lower lobe airspace consolidation is present. There is patchy airspace disease in the lingula. Ground-glass attenuation anteriorly in the right upper lobe is stable. Airspace disease at the right base has increased since the prior CT. It is less prominent than on the left. Upper Abdomen: Within normal limits. Granulomatous changes are present in the spleen. Musculoskeletal: Vertebral  body heights maintained. There is stable curvature of the thoracic spine. IMPRESSION: 1. Left lower lobe and lingular airspace disease compatible with pneumonia. 2. Right lower lobe airspace disease is also noted, likely representing infection. 3. Stable ground-glass attenuation in the anterior right upper lobe. This may represent chronic scarring. 4. Stable mild enlargement of the pulmonary arteries suggesting pulmonary arterial hypertension. 5.  Tracheostomy tube in place. 6. Stable curvature of the thoracic spine. 7. Aortic Atherosclerosis (ICD10-I70.0). 8. IV contrast was not given due to IV failure. Electronically Signed   By: San Morelle M.D.   On: 10/06/2019 07:59    Assessment/Plan Active Problems:   Acute on chronic respiratory failure with hypoxia (HCC)   COVID-19 virus infection   Pneumonia due to COVID-19 virus   Chronic systolic heart failure (HCC)   Sepsis due to Haemophilus influenzae with acute hypoxic respiratory failure and septic shock (HCC)   Chronic atrial fibrillation ([place])   1. Acute on chronic respiratory failure hypoxia we will continue to try to wean FiO2 down patient requiring 8 L at this time continue supportive measures pulmonary toilet. 2. COVID-19 virus infection in resolution phase 3. Pneumonia due to COVID-19 treated still with residual airspace disease noted 4. Chronic systolic heart failure monitor fluid status 5. Severe sepsis secondary to Haemophilus influenzae continue present management 6. Chronic atrial fibrillation rate is controlled at this time Happening prior   I have personally seen and evaluated the patient, evaluated laboratory and imaging results, formulated the assessment and plan and placed orders. The Patient requires high complexity decision making for assessment and support.  Case was discussed on Rounds with the Respiratory Therapy Staff  Allyne Gee, MD [facility] Pulmonary Critical Care Medicine Sleep Medicine

## 2019-10-08 LAB — BASIC METABOLIC PANEL
Anion gap: 11 (ref 5–15)
BUN: 39 mg/dL — ABNORMAL HIGH (ref 8–23)
CO2: 39 mmol/L — ABNORMAL HIGH (ref 22–32)
Calcium: 10 mg/dL (ref 8.9–10.3)
Chloride: 100 mmol/L (ref 98–111)
Creatinine, Ser: 0.8 mg/dL (ref 0.61–1.24)
GFR calc Af Amer: >60 mL/min (ref >60)
GFR calc non Af Amer: >60 mL/min (ref >60)
Glucose, Bld: 124 mg/dL — ABNORMAL HIGH (ref 70–99)
Potassium: 3.6 mmol/L (ref 3.5–5.1)
Sodium: 150 mmol/L — ABNORMAL HIGH (ref 135–145)

## 2019-10-08 NOTE — Progress Notes (Addendum)
Pulmonary Critical Care Medicine [place]   PULMONARY CRITICAL CARE SERVICE  PROGRESS NOTE  Date of Service: 10/08/2019  Sergio Mitchell  [id number]:[id number]  DOB: 1951-11-14   DOA: 09/16/2019  Referring Physician: [name], MD  HPI: Sergio Mitchell is a 68 y.o. male seen for follow up of Acute on Chronic Respiratory Failure.  Patient remains on nag and was increased to 10 L overnight.  Currently satting well no distress.  Medications: Reviewed on Rounds  Physical Exam:  Vitals: Pulse 80 respirations 17 BP 138/82 sat 92% temp 97.5  Ventilator Settings NAG 10 L  . General: Comfortable at this time . Eyes: Grossly normal lids, irises & conjunctiva . ENT: grossly tongue is normal . Neck: no obvious mass . Cardiovascular: S1 S2 normal no gallop . Respiratory: No rales or rhonchi noted . Abdomen: soft . Skin: no rash seen on limited exam . Musculoskeletal: not rigid . Psychiatric:unable to assess . Neurologic: no seizure no involuntary movements         Lab Data:   Basic Metabolic Panel: Recent Labs  Lab 10/02/19 0606 10/04/19 0329 10/05/19 0449 10/06/19 0959 10/08/19 0456  NA 150* 152* 152*  --  150*  K 3.9 3.3* 3.3* 3.7 3.6  CL 97* 99 101  --  100  CO2 42* 42* 41*  --  39*  GLUCOSE 121* 110* 153*  --  124*  BUN 45* 46* 45*  --  39*  CREATININE 1.18 1.07 1.02  --  0.80  CALCIUM 10.6* 10.4* 10.4*  --  10.0    ABG: No results for input(s): PHART, PCO2ART, PO2ART, HCO3, O2SAT in the last 168 hours.  Liver Function Tests: No results for input(s): AST, ALT, ALKPHOS, BILITOT, PROT, ALBUMIN in the last 168 hours. No results for input(s): LIPASE, AMYLASE in the last 168 hours. No results for input(s): AMMONIA in the last 168 hours.  CBC: Recent Labs  Lab 10/02/19 0606  WBC 7.6  HGB 13.3  HCT 43.6  MCV 103.3*  PLT 367    Cardiac Enzymes: No results for input(s): CKTOTAL, CKMB, CKMBINDEX, TROPONINI in the last 168 hours.  BNP (last 3  results) No results for input(s): BNP in the last 8760 hours.  ProBNP (last 3 results) No results for input(s): PROBNP in the last 8760 hours.  Radiological Exams: No results found.  Assessment/Plan Active Problems:   Acute on chronic respiratory failure with hypoxia (HCC)   COVID-19 virus infection   Pneumonia due to COVID-19 virus   Chronic systolic heart failure (HCC)   Sepsis due to Haemophilus influenzae with acute hypoxic respiratory failure and septic shock (HCC)   Chronic atrial fibrillation ([place])   1. Acute on chronic respiratory failure hypoxia we will continue to try to wean FiO2 down patient requiring 10 L at this time continue supportive measures pulmonary toilet. 2. COVID-19 virus infection in resolution phase 3. Pneumonia due to COVID-19 treated still with residual airspace disease noted 4. Chronic systolic heart failure monitor fluid status 5. Severe sepsis secondary to Haemophilus influenzae continue present management 6. Chronic atrial fibrillation rate is controlled at this time Happening prior   I have personally seen and evaluated the patient, evaluated laboratory and imaging results, formulated the assessment and plan and placed orders. The Patient requires high complexity decision making for assessment and support.  Case was discussed on Rounds with the Respiratory Therapy Staff  Allyne Gee, MD [facility] Pulmonary Critical Care Medicine Sleep Medicine

## 2019-10-09 NOTE — Progress Notes (Signed)
Pulmonary Critical Care Medicine [place]   PULMONARY CRITICAL CARE SERVICE  PROGRESS NOTE  Date of Service: 10/09/2019  Sergio Mitchell  ZOX:[id number]  DOB: May 03, 1951   DOA: 09/16/2019  Referring Physician: [name], MD  HPI: Sergio Mitchell is a 68 y.o. male seen for follow up of Acute on Chronic Respiratory Failure.  Patient currently is on nag device has been on 10 L oxygen  Medications: Reviewed on Rounds  Physical Exam:  Vitals: Temperature 97.7 pulse 93 respiratory 14 blood pressure 115/65 saturations 92%  Ventilator Settings off the ventilator on nag device  . General: Comfortable at this time . Eyes: Grossly normal lids, irises & conjunctiva . ENT: grossly tongue is normal . Neck: no obvious mass . Cardiovascular: S1 S2 normal no gallop . Respiratory: No rhonchi no rales are noted at this time . Abdomen: soft . Skin: no rash seen on limited exam . Musculoskeletal: not rigid . Psychiatric:unable to assess . Neurologic: no seizure no involuntary movements         Lab Data:   Basic Metabolic Panel: Recent Labs  Lab 10/04/19 0329 10/05/19 0449 10/06/19 0959 10/08/19 0456  NA 152* 152*  --  150*  K 3.3* 3.3* 3.7 3.6  CL 99 101  --  100  CO2 42* 41*  --  39*  GLUCOSE 110* 153*  --  124*  BUN 46* 45*  --  39*  CREATININE 1.07 1.02  --  0.80  CALCIUM 10.4* 10.4*  --  10.0    ABG: No results for input(s): PHART, PCO2ART, PO2ART, HCO3, O2SAT in the last 168 hours.  Liver Function Tests: No results for input(s): AST, ALT, ALKPHOS, BILITOT, PROT, ALBUMIN in the last 168 hours. No results for input(s): LIPASE, AMYLASE in the last 168 hours. No results for input(s): AMMONIA in the last 168 hours.  CBC: No results for input(s): WBC, NEUTROABS, HGB, HCT, MCV, PLT in the last 168 hours.  Cardiac Enzymes: No results for input(s): CKTOTAL, CKMB, CKMBINDEX, TROPONINI in the last 168 hours.  BNP (last 3 results) No results for  input(s): BNP in the last 8760 hours.  ProBNP (last 3 results) No results for input(s): PROBNP in the last 8760 hours.  Radiological Exams: No results found.  Assessment/Plan Active Problems:   Acute on chronic respiratory failure with hypoxia (HCC)   COVID-19 virus infection   Pneumonia due to COVID-19 virus   Chronic systolic heart failure (HCC)   Sepsis due to Haemophilus influenzae with acute hypoxic respiratory failure and septic shock (HCC)   Chronic atrial fibrillation ([place])   1. Acute on chronic respiratory failure with hypoxia we will continue with oxygenation as tolerated.  Asked for respiratory therapy to try to wean the FiO2 down and consider recapping 2. COVID-19 virus infection in recovery phase 3. Pneumonia due to COVID-19 treated 4. Chronic systolic heart failure monitor fluid status 5. Sepsis treated we will continue with supportive care 6. Chronic atrial fibrillation rate controlled   I have personally seen and evaluated the patient, evaluated laboratory and imaging results, formulated the assessment and plan and placed orders. The Patient requires high complexity decision making for assessment and support.  Case was discussed on Rounds with the Respiratory Therapy Staff  Allyne Gee, MD [facility] Pulmonary Critical Care Medicine Sleep Medicine

## 2019-10-10 LAB — BLOOD GAS, ARTERIAL
Acid-Base Excess: 16.4 mmol/L — ABNORMAL HIGH (ref 0.0–2.0)
Bicarbonate: 41.6 mmol/L — ABNORMAL HIGH (ref 20.0–28.0)
FIO2: 68
O2 Saturation: 93.2 %
Patient temperature: 37
pCO2 arterial: 61.6 mmHg — ABNORMAL HIGH (ref 32.0–48.0)
pH, Arterial: 7.445 (ref 7.350–7.450)
pO2, Arterial: 65 mmHg — ABNORMAL LOW (ref 83.0–108.0)

## 2019-10-10 NOTE — Progress Notes (Addendum)
Pulmonary Critical Care Medicine [place]   PULMONARY CRITICAL CARE SERVICE  PROGRESS NOTE  Date of Service: 10/10/2019  Sergio Mitchell  [id number]  DOB: 1951-03-26   DOA: 09/16/2019  Referring Physician: [name], MD  HPI: Sergio Mitchell is a 68 y.o. male seen for follow up of Acute on Chronic Respiratory Failure.  Patient continues using 2 L of oxygen via nasal cannula and also the NAG.  Family at this time.  Medications: Reviewed on Rounds  Physical Exam:  Vitals: Pulse 70 respirations 35 BP 136/72 O2 sat 98% temp 97.1  Ventilator Settings nag 10 L and 2 L nasal cannula  . General: Comfortable at this time . Eyes: Grossly normal lids, irises & conjunctiva . ENT: grossly tongue is normal . Neck: no obvious mass . Cardiovascular: S1 S2 normal no gallop . Respiratory: No rales or rhonchi noted . Abdomen: soft . Skin: no rash seen on limited exam . Musculoskeletal: not rigid . Psychiatric:unable to assess . Neurologic: no seizure no involuntary movements         Lab Data:   Basic Metabolic Panel: Recent Labs  Lab 10/04/19 0329 10/05/19 0449 10/06/19 0959 10/08/19 0456  NA 152* 152*  --  150*  K 3.3* 3.3* 3.7 3.6  CL 99 101  --  100  CO2 42* 41*  --  39*  GLUCOSE 110* 153*  --  124*  BUN 46* 45*  --  39*  CREATININE 1.07 1.02  --  0.80  CALCIUM 10.4* 10.4*  --  10.0    ABG: Recent Labs  Lab 10/10/19 1015  PHART 7.445  PCO2ART 61.6*  PO2ART 65.0*  HCO3 41.6*  O2SAT 93.2    Liver Function Tests: No results for input(s): AST, ALT, ALKPHOS, BILITOT, PROT, ALBUMIN in the last 168 hours. No results for input(s): LIPASE, AMYLASE in the last 168 hours. No results for input(s): AMMONIA in the last 168 hours.  CBC: No results for input(s): WBC, NEUTROABS, HGB, HCT, MCV, PLT in the last 168 hours.  Cardiac Enzymes: No results for input(s): CKTOTAL, CKMB, CKMBINDEX, TROPONINI in the last 168 hours.  BNP (last 3 results) No  results for input(s): BNP in the last 8760 hours.  ProBNP (last 3 results) No results for input(s): PROBNP in the last 8760 hours.  Radiological Exams: No results found.  Assessment/Plan Active Problems:   Acute on chronic respiratory failure with hypoxia (HCC)   COVID-19 virus infection   Pneumonia due to COVID-19 virus   Chronic systolic heart failure (HCC)   Sepsis due to Haemophilus influenzae with acute hypoxic respiratory failure and septic shock (HCC)   Chronic atrial fibrillation ([place])   1. Acute on chronic respiratory failure with hypoxia we will continue with oxygenation as tolerated.    Continue to wean per protocol as patient can tolerate.  Continue supportive measures. 2. COVID-19 virus infection in recovery phase 3. Pneumonia due to COVID-19 treated 4. Chronic systolic heart failure monitor fluid status 5. Sepsis treated we will continue with supportive care 6. Chronic atrial fibrillation rate controlled   I have personally seen and evaluated the patient, evaluated laboratory and imaging results, formulated the assessment and plan and placed orders. The Patient requires high complexity decision making for assessment and support.  Case was discussed on Rounds with the Respiratory Therapy Staff  Allyne Gee, MD [facility] Pulmonary Critical Care Medicine Sleep Medicine

## 2019-10-11 LAB — BASIC METABOLIC PANEL
Anion gap: 9 (ref 5–15)
BUN: 35 mg/dL — ABNORMAL HIGH (ref 8–23)
CO2: 38 mmol/L — ABNORMAL HIGH (ref 22–32)
Calcium: 10.1 mg/dL (ref 8.9–10.3)
Chloride: 102 mmol/L (ref 98–111)
Creatinine, Ser: 0.77 mg/dL (ref 0.61–1.24)
GFR calc Af Amer: >60 mL/min (ref >60)
GFR calc non Af Amer: >60 mL/min (ref >60)
Glucose, Bld: 152 mg/dL — ABNORMAL HIGH (ref 70–99)
Potassium: 3.6 mmol/L (ref 3.5–5.1)
Sodium: 149 mmol/L — ABNORMAL HIGH (ref 135–145)

## 2019-10-11 LAB — CBC
HCT: 38.4 % — ABNORMAL LOW (ref 39.0–52.0)
Hemoglobin: 12 g/dL — ABNORMAL LOW (ref 13.0–17.0)
MCH: 31.6 pg (ref 26.0–34.0)
MCHC: 31.3 g/dL (ref 30.0–36.0)
MCV: 101.1 fL — ABNORMAL HIGH (ref 80.0–100.0)
Platelets: 299 10*3/uL (ref 150–400)
RBC: 3.8 MIL/uL — ABNORMAL LOW (ref 4.22–5.81)
RDW: 14.7 % (ref 11.5–15.5)
WBC: 10.8 10*3/uL — ABNORMAL HIGH (ref 4.0–10.5)
nRBC: 0 % (ref 0.0–0.2)

## 2019-10-11 NOTE — Progress Notes (Signed)
Pulmonary Critical Care Medicine [place]   PULMONARY CRITICAL CARE SERVICE  PROGRESS NOTE  Date of Service: 10/11/2019  Sergio Mitchell  ATF:[id number]  DOB: June 04, 1951   DOA: 09/16/2019  Referring Physician: [name], MD  HPI: Sergio Mitchell is a 68 y.o. male seen for follow up of Acute on Chronic Respiratory Failure.  Patient remains on the mag device has been requiring increasing oxygen at 12 L now  Medications: Reviewed on Rounds  Physical Exam:  Vitals: Temperature 99.1 pulse 90 respiratory 18 blood pressure 163/81 saturations 100%  Ventilator Settings on the nag device 12 L oxygen  . General: Comfortable at this time . Eyes: Grossly normal lids, irises & conjunctiva . ENT: grossly tongue is normal . Neck: no obvious mass . Cardiovascular: S1 S2 normal no gallop . Respiratory: No rhonchi no rales . Abdomen: soft . Skin: no rash seen on limited exam . Musculoskeletal: not rigid . Psychiatric:unable to assess . Neurologic: no seizure no involuntary movements         Lab Data:   Basic Metabolic Panel: Recent Labs  Lab 10/05/19 0449 10/06/19 0959 10/08/19 0456 10/11/19 0807  NA 152*  --  150* 149*  K 3.3* 3.7 3.6 3.6  CL 101  --  100 102  CO2 41*  --  39* 38*  GLUCOSE 153*  --  124* 152*  BUN 45*  --  39* 35*  CREATININE 1.02  --  0.80 0.77  CALCIUM 10.4*  --  10.0 10.1    ABG: Recent Labs  Lab 10/10/19 1015  PHART 7.445  PCO2ART 61.6*  PO2ART 65.0*  HCO3 41.6*  O2SAT 93.2    Liver Function Tests: No results for input(s): AST, ALT, ALKPHOS, BILITOT, PROT, ALBUMIN in the last 168 hours. No results for input(s): LIPASE, AMYLASE in the last 168 hours. No results for input(s): AMMONIA in the last 168 hours.  CBC: Recent Labs  Lab 10/11/19 0807  WBC 10.8*  HGB 12.0*  HCT 38.4*  MCV 101.1*  PLT 299    Cardiac Enzymes: No results for input(s): CKTOTAL, CKMB, CKMBINDEX, TROPONINI in the last 168 hours.  BNP (last  3 results) No results for input(s): BNP in the last 8760 hours.  ProBNP (last 3 results) No results for input(s): PROBNP in the last 8760 hours.  Radiological Exams: No results found.  Assessment/Plan Active Problems:   Acute on chronic respiratory failure with hypoxia (HCC)   COVID-19 virus infection   Pneumonia due to COVID-19 virus   Chronic systolic heart failure (HCC)   Sepsis due to Haemophilus influenzae with acute hypoxic respiratory failure and septic shock (HCC)   Chronic atrial fibrillation ([place])   1. Acute on chronic respiratory failure hypoxia plan is to continue with nag device titrate oxygen down as tolerated 2. COVID-19 virus infection we will continue supportive care 3. Pneumonia due to COVID-19 treated very very slow to improve we will continue to follow. 4. Chronic systolic heart failure would again work on keeping her on the dry side 5. Sepsis treated we will continue with supportive care 6. Chronic atrial fibrillation rate is controlled   I have personally seen and evaluated the patient, evaluated laboratory and imaging results, formulated the assessment and plan and placed orders. The Patient requires high complexity decision making for assessment and support.  Case was discussed on Rounds with the Respiratory Therapy Staff  Allyne Gee, MD [facility] Pulmonary Critical Care Medicine Sleep Medicine

## 2019-10-12 LAB — BLOOD GAS, ARTERIAL
Acid-Base Excess: 14.9 mmol/L — ABNORMAL HIGH (ref 0.0–2.0)
Bicarbonate: 39.5 mmol/L — ABNORMAL HIGH (ref 20.0–28.0)
FIO2: 60
O2 Saturation: 99.3 %
Patient temperature: 38.3
pCO2 arterial: 55.4 mmHg — ABNORMAL HIGH (ref 32.0–48.0)
pH, Arterial: 7.473 — ABNORMAL HIGH (ref 7.350–7.450)
pO2, Arterial: 143 mmHg — ABNORMAL HIGH (ref 83.0–108.0)

## 2019-10-12 NOTE — Progress Notes (Signed)
Pulmonary Critical Care Medicine [place]   PULMONARY CRITICAL CARE SERVICE  PROGRESS NOTE  Date of Service: 10/12/2019  PRUITT TABOADA  [id number]  DOB: Oct 01, 1951   DOA: 09/16/2019  Referring Physician: [name], MD  HPI: Sergio Mitchell is a 68 y.o. male seen for follow up of Acute on Chronic Respiratory Failure.  Currently is on nag device 10 L doing fairly well  Medications: Reviewed on Rounds  Physical Exam:  Vitals: Temperature 99.7 pulse 96 respiratory rate 23 blood pressure 126/72 saturations 96%  Ventilator Settings patient is off the ventilator right now on nag device  . General: Comfortable at this time . Eyes: Grossly normal lids, irises & conjunctiva . ENT: grossly tongue is normal . Neck: no obvious mass . Cardiovascular: S1 S2 normal no gallop . Respiratory: No rhonchi no rales are noted at this time . Abdomen: soft . Skin: no rash seen on limited exam . Musculoskeletal: not rigid . Psychiatric:unable to assess . Neurologic: no seizure no involuntary movements         Lab Data:   Basic Metabolic Panel: Recent Labs  Lab 10/06/19 0959 10/08/19 0456 10/11/19 0807  NA  --  150* 149*  K 3.7 3.6 3.6  CL  --  100 102  CO2  --  39* 38*  GLUCOSE  --  124* 152*  BUN  --  39* 35*  CREATININE  --  0.80 0.77  CALCIUM  --  10.0 10.1    ABG: Recent Labs  Lab 10/10/19 1015  PHART 7.445  PCO2ART 61.6*  PO2ART 65.0*  HCO3 41.6*  O2SAT 93.2    Liver Function Tests: No results for input(s): AST, ALT, ALKPHOS, BILITOT, PROT, ALBUMIN in the last 168 hours. No results for input(s): LIPASE, AMYLASE in the last 168 hours. No results for input(s): AMMONIA in the last 168 hours.  CBC: Recent Labs  Lab 10/11/19 0807  WBC 10.8*  HGB 12.0*  HCT 38.4*  MCV 101.1*  PLT 299    Cardiac Enzymes: No results for input(s): CKTOTAL, CKMB, CKMBINDEX, TROPONINI in the last 168 hours.  BNP (last 3 results) No results for  input(s): BNP in the last 8760 hours.  ProBNP (last 3 results) No results for input(s): PROBNP in the last 8760 hours.  Radiological Exams: No results found.  Assessment/Plan Active Problems:   Acute on chronic respiratory failure with hypoxia (HCC)   COVID-19 virus infection   Pneumonia due to COVID-19 virus   Chronic systolic heart failure (HCC)   Sepsis due to Haemophilus influenzae with acute hypoxic respiratory failure and septic shock (HCC)   Chronic atrial fibrillation ([place])   1. Acute on chronic respiratory failure hypoxia we will titrate oxygen down saturations are good patient chest x-ray revealed improving infiltrate actually with some left lower lobe effusion suggested that we place the patient with the left side up to improve oxygenation 2. COVID-19 virus infection in recovery phase 3. Pneumonia due to COVID-19 slowly improving 4. Chronic systolic heart failure we will continue with supportive care 5. Severe sepsis resolved 6. Chronic atrial fibrillation rate controlled at this time continue to monitor   I have personally seen and evaluated the patient, evaluated laboratory and imaging results, formulated the assessment and plan and placed orders. The Patient requires high complexity decision making for assessment and support.  Case was discussed on Rounds with the Respiratory Therapy Staff  Allyne Gee, MD [facility] Pulmonary Critical Care Medicine Sleep Medicine

## 2019-10-13 ENCOUNTER — Other Ambulatory Visit (HOSPITAL_COMMUNITY): Payer: Medicare Other

## 2019-10-13 LAB — CBC
HCT: 38.6 % — ABNORMAL LOW (ref 39.0–52.0)
Hemoglobin: 11.9 g/dL — ABNORMAL LOW (ref 13.0–17.0)
MCH: 31.6 pg (ref 26.0–34.0)
MCHC: 30.8 g/dL (ref 30.0–36.0)
MCV: 102.7 fL — ABNORMAL HIGH (ref 80.0–100.0)
Platelets: 306 10*3/uL (ref 150–400)
RBC: 3.76 MIL/uL — ABNORMAL LOW (ref 4.22–5.81)
RDW: 14.8 % (ref 11.5–15.5)
WBC: 12.9 10*3/uL — ABNORMAL HIGH (ref 4.0–10.5)
nRBC: 0 % (ref 0.0–0.2)

## 2019-10-13 LAB — BASIC METABOLIC PANEL
Anion gap: 12 (ref 5–15)
BUN: 43 mg/dL — ABNORMAL HIGH (ref 8–23)
CO2: 35 mmol/L — ABNORMAL HIGH (ref 22–32)
Calcium: 10 mg/dL (ref 8.9–10.3)
Chloride: 101 mmol/L (ref 98–111)
Creatinine, Ser: 1 mg/dL (ref 0.61–1.24)
GFR calc Af Amer: >60 mL/min (ref >60)
GFR calc non Af Amer: >60 mL/min (ref >60)
Glucose, Bld: 150 mg/dL — ABNORMAL HIGH (ref 70–99)
Potassium: 4.4 mmol/L (ref 3.5–5.1)
Sodium: 148 mmol/L — ABNORMAL HIGH (ref 135–145)

## 2019-10-13 NOTE — Progress Notes (Addendum)
Pulmonary Critical Care Medicine [place]   PULMONARY CRITICAL CARE SERVICE  PROGRESS NOTE  Date of Service: 10/13/2019  Sergio Mitchell  UMP:[id number]  DOB: February 24, 1951   DOA: 09/16/2019  Referring Physician: [name], MD  HPI: Sergio Mitchell is a 68 y.o. male seen for follow up of Acute on Chronic Respiratory Failure.  Remains on NAG at this time.  Most recent chest x-ray shows some remaining pleural effusion as well as some likely pneumonia.  Most recent ejection fraction 20 to 25%.  Medical team is ordering levofloxacin at this time.  Medications: Reviewed on Rounds  Physical Exam:  Vitals: Pulse 101 respirations 20 BP 131/68 O2 sat 97% to 99.1  Ventilator Settings 5 L NAG  . General: Comfortable at this time . Eyes: Grossly normal lids, irises & conjunctiva . ENT: grossly tongue is normal . Neck: no obvious mass . Cardiovascular: S1 S2 normal no gallop . Respiratory: Diminished breath sounds . Abdomen: soft . Skin: no rash seen on limited exam . Musculoskeletal: not rigid . Psychiatric:unable to assess . Neurologic: no seizure no involuntary movements         Lab Data:   Basic Metabolic Panel: Recent Labs  Lab 10/08/19 0456 10/11/19 0807 10/13/19 0548  NA 150* 149* 148*  K 3.6 3.6 4.4  CL 100 102 101  CO2 39* 38* 35*  GLUCOSE 124* 152* 150*  BUN 39* 35* 43*  CREATININE 0.80 0.77 1.00  CALCIUM 10.0 10.1 10.0    ABG: Recent Labs  Lab 10/10/19 1015 10/12/19 1406  PHART 7.445 7.473*  PCO2ART 61.6* 55.4*  PO2ART 65.0* 143*  HCO3 41.6* 39.5*  O2SAT 93.2 99.3    Liver Function Tests: No results for input(s): AST, ALT, ALKPHOS, BILITOT, PROT, ALBUMIN in the last 168 hours. No results for input(s): LIPASE, AMYLASE in the last 168 hours. No results for input(s): AMMONIA in the last 168 hours.  CBC: Recent Labs  Lab 10/11/19 0807 10/13/19 0548  WBC 10.8* 12.9*  HGB 12.0* 11.9*  HCT 38.4* 38.6*  MCV 101.1* 102.7*  PLT  299 306    Cardiac Enzymes: No results for input(s): CKTOTAL, CKMB, CKMBINDEX, TROPONINI in the last 168 hours.  BNP (last 3 results) No results for input(s): BNP in the last 8760 hours.  ProBNP (last 3 results) No results for input(s): PROBNP in the last 8760 hours.  Radiological Exams: Dg Chest Port 1 View  Result Date: 10/13/2019 CLINICAL DATA:  Follow-up pneumonia. EXAM: PORTABLE CHEST 1 VIEW COMPARISON:  10/04/2019 FINDINGS: Tracheostomy tube remains in appropriate position. Patient is rotated to the left. Heart size is stable. Persistent confluent opacity is seen in the left retrocardiac lung base, which may be due to infiltrate, atelectasis, and/or pleural effusion. Interval improvement in right basilar infiltrate or atelectasis since prior exam. IMPRESSION: 1. Stable left retrocardiac opacity, which may be due to infiltrate, atelectasis, and/or pleural effusion. 2. Interval improvement in right basilar infiltrate or atelectasis. Electronically Signed   By: Marlaine Hind M.D.   On: 10/13/2019 07:32    Assessment/Plan Active Problems:   Acute on chronic respiratory failure with hypoxia (HCC)   COVID-19 virus infection   Pneumonia due to COVID-19 virus   Chronic systolic heart failure (HCC)   Sepsis due to Haemophilus influenzae with acute hypoxic respiratory failure and septic shock (HCC)   Chronic atrial fibrillation ([place])   1. Acute on chronic respiratory failure hypoxia patient is on NAG at this time we will continue to  titrate oxygen as tolerated.  Continuous pulmonary toilet supportive measures at this time. 2. COVID-19 virus infection in recovery phase 3. Pneumonia due to COVID-19, improving. 4. Chronic systolic heart failure we will continue with supportive care 5. Severe sepsis resolved 6. Chronic atrial fibrillation rate controlled at this time continue to monitor   I have personally seen and evaluated the patient, evaluated laboratory and imaging results,  formulated the assessment and plan and placed orders. The Patient requires high complexity decision making for assessment and support.  Case was discussed on Rounds with the Respiratory Therapy Staff  Yevonne Pax, MD [facility] Pulmonary Critical Care Medicine Sleep Medicine

## 2019-10-14 ENCOUNTER — Institutional Professional Consult (permissible substitution) (HOSPITAL_COMMUNITY): Payer: Medicare Other

## 2019-10-14 LAB — BLOOD GAS, ARTERIAL
Acid-Base Excess: 14.1 mmol/L — ABNORMAL HIGH (ref 0.0–2.0)
Acid-Base Excess: 15.6 mmol/L — ABNORMAL HIGH (ref 0.0–2.0)
Acid-Base Excess: 15.8 mmol/L — ABNORMAL HIGH (ref 0.0–2.0)
Bicarbonate: 40.7 mmol/L — ABNORMAL HIGH (ref 20.0–28.0)
Bicarbonate: 40.9 mmol/L — ABNORMAL HIGH (ref 20.0–28.0)
Bicarbonate: 41.1 mmol/L — ABNORMAL HIGH (ref 20.0–28.0)
FIO2: 100
FIO2: 100
FIO2: 100
O2 Saturation: 79.6 %
O2 Saturation: 97.3 %
O2 Saturation: 97.9 %
Patient temperature: 37
Patient temperature: 37
Patient temperature: 37.2
pCO2 arterial: 82 mmHg (ref 32.0–48.0)
pCO2 arterial: 62.6 mmHg — ABNORMAL HIGH (ref 32.0–48.0)
pCO2 arterial: 63.1 mmHg — ABNORMAL HIGH (ref 32.0–48.0)
pH, Arterial: 7.316 — ABNORMAL LOW (ref 7.350–7.450)
pH, Arterial: 7.429 (ref 7.350–7.450)
pH, Arterial: 7.433 (ref 7.350–7.450)
pO2, Arterial: 48 mmHg — ABNORMAL LOW (ref 83.0–108.0)
pO2, Arterial: 101 mmHg (ref 83.0–108.0)
pO2, Arterial: 97.8 mmHg (ref 83.0–108.0)

## 2019-10-14 LAB — BASIC METABOLIC PANEL
Anion gap: 12 (ref 5–15)
BUN: 52 mg/dL — ABNORMAL HIGH (ref 8–23)
CO2: 35 mmol/L — ABNORMAL HIGH (ref 22–32)
Calcium: 10 mg/dL (ref 8.9–10.3)
Chloride: 102 mmol/L (ref 98–111)
Creatinine, Ser: 1.3 mg/dL — ABNORMAL HIGH (ref 0.61–1.24)
GFR calc Af Amer: >60 mL/min (ref >60)
GFR calc non Af Amer: 56 mL/min — ABNORMAL LOW (ref >60)
Glucose, Bld: 239 mg/dL — ABNORMAL HIGH (ref 70–99)
Potassium: 5.2 mmol/L — ABNORMAL HIGH (ref 3.5–5.1)
Sodium: 149 mmol/L — ABNORMAL HIGH (ref 135–145)

## 2019-10-14 LAB — CBC
HCT: 38.2 % — ABNORMAL LOW (ref 39.0–52.0)
Hemoglobin: 11.7 g/dL — ABNORMAL LOW (ref 13.0–17.0)
MCH: 31.5 pg (ref 26.0–34.0)
MCHC: 30.6 g/dL (ref 30.0–36.0)
MCV: 103 fL — ABNORMAL HIGH (ref 80.0–100.0)
Platelets: 350 10*3/uL (ref 150–400)
RBC: 3.71 MIL/uL — ABNORMAL LOW (ref 4.22–5.81)
RDW: 14.5 % (ref 11.5–15.5)
WBC: 14.3 10*3/uL — ABNORMAL HIGH (ref 4.0–10.5)
nRBC: 0 % (ref 0.0–0.2)

## 2019-10-14 LAB — BRAIN NATRIURETIC PEPTIDE: B Natriuretic Peptide: 87.2 pg/mL (ref 0.0–100.0)

## 2019-10-14 LAB — C-REACTIVE PROTEIN: CRP: 20.6 mg/dL — ABNORMAL HIGH (ref <1.0)

## 2019-10-14 LAB — D-DIMER, QUANTITATIVE: D-Dimer, Quant: 1.92 ug/mL-FEU — ABNORMAL HIGH (ref 0.00–0.50)

## 2019-10-14 LAB — LACTATE DEHYDROGENASE: LDH: 203 U/L — ABNORMAL HIGH (ref 98–192)

## 2019-10-14 NOTE — Progress Notes (Addendum)
Pulmonary Critical Care Medicine [place]   PULMONARY CRITICAL CARE SERVICE  PROGRESS NOTE  Date of Service: 10/14/2019  Sergio Mitchell  [id number]:[id number]  DOB: 06-23-1951   DOA: 09/16/2019  Referring Physician: [name], MD  HPI: Sergio Mitchell is a 68 y.o. male seen for follow up of Acute on Chronic Respiratory Failure.  Patient had episode of desaturation while on NAG RT increased oxygen and subsequently patient on BiPAP.  He was moved back for a period of time before having to be put back on the ventilator full support.  Medications: Reviewed on Rounds  Physical Exam:  Vitals: Pulse 98 respirations 19 BP 1 3774 O2 sat 93% temp 99.6  Ventilator Settings ventilator AC VC rate 30-1 500 PEEP of 10 with an FiO2 20%  . General: Comfortable at this time . Eyes: Grossly normal lids, irises & conjunctiva . ENT: grossly tongue is normal . Neck: no obvious mass . Cardiovascular: S1 S2 normal no gallop . Respiratory: Coarse breath sounds diminished in bases . Abdomen: soft . Skin: no rash seen on limited exam . Musculoskeletal: not rigid . Psychiatric:unable to assess . Neurologic: no seizure no involuntary movements         Lab Data:   Basic Metabolic Panel: Recent Labs  Lab 10/08/19 0456 10/11/19 0807 10/13/19 0548 10/14/19 1007  NA 150* 149* 148* 149*  K 3.6 3.6 4.4 5.2*  CL 100 102 101 102  CO2 39* 38* 35* 35*  GLUCOSE 124* 152* 150* 239*  BUN 39* 35* 43* 52*  CREATININE 0.80 0.77 1.00 1.30*  CALCIUM 10.0 10.1 10.0 10.0    ABG: Recent Labs  Lab 10/10/19 1015 10/12/19 1406 10/14/19 1000  PHART 7.445 7.473* 7.316*  PCO2ART 61.6* 55.4* 82.0*  PO2ART 65.0* 143* 48.0*  HCO3 41.6* 39.5* 40.7*  O2SAT 93.2 99.3 79.6    Liver Function Tests: No results for input(s): AST, ALT, ALKPHOS, BILITOT, PROT, ALBUMIN in the last 168 hours. No results for input(s): LIPASE, AMYLASE in the last 168 hours. No results for input(s): AMMONIA in the last  168 hours.  CBC: Recent Labs  Lab 10/11/19 0807 10/13/19 0548 10/14/19 1007  WBC 10.8* 12.9* 14.3*  HGB 12.0* 11.9* 11.7*  HCT 38.4* 38.6* 38.2*  MCV 101.1* 102.7* 103.0*  PLT 299 306 350    Cardiac Enzymes: No results for input(s): CKTOTAL, CKMB, CKMBINDEX, TROPONINI in the last 168 hours.  BNP (last 3 results) Recent Labs    10/14/19 1007  BNP 87.2    ProBNP (last 3 results) No results for input(s): PROBNP in the last 8760 hours.  Radiological Exams: Dg Chest Port 1 View  Result Date: 10/14/2019 CLINICAL DATA:  68 year old male with hypoxia, COVID pneumonia EXAM: PORTABLE CHEST 1 VIEW COMPARISON:  Prior chest x-ray 10/13/2019 FINDINGS: Tracheostomy tube remains in good position. The tip is midline and at the level of the clavicles. Stable cardiac and mediastinal contours. Borderline cardiomegaly. The patient remains rotated toward the left. Dense left basilar airspace opacity obscures the left hemidiaphragm and the Yueh left cardiac margins. Streaky airspace opacities are also present in the right base, slightly increased compared to prior. No pneumothorax. No pulmonary edema. No new airspace opacity. No acute osseous abnormality. IMPRESSION: 1. Persistent dense left basilar airspace opacity which may reflect a combination of pleural effusion, atelectasis and infiltrate. 2. Slightly increased streaky airspace opacities in the right lung base remain most consistent with atelectasis. 3. Stable position of tracheostomy tube. Electronically Signed  By: Malachy Moan M.D.   On: 10/14/2019 10:19   Dg Chest Port 1 View  Result Date: 10/13/2019 CLINICAL DATA:  Follow-up pneumonia. EXAM: PORTABLE CHEST 1 VIEW COMPARISON:  10/04/2019 FINDINGS: Tracheostomy tube remains in appropriate position. Patient is rotated to the left. Heart size is stable. Persistent confluent opacity is seen in the left retrocardiac lung base, which may be due to infiltrate, atelectasis, and/or pleural  effusion. Interval improvement in right basilar infiltrate or atelectasis since prior exam. IMPRESSION: 1. Stable left retrocardiac opacity, which may be due to infiltrate, atelectasis, and/or pleural effusion. 2. Interval improvement in right basilar infiltrate or atelectasis. Electronically Signed   By: Danae Orleans M.D.   On: 10/13/2019 07:32    Assessment/Plan Active Problems:   Acute on chronic respiratory failure with hypoxia (HCC)   COVID-19 virus infection   Pneumonia due to COVID-19 virus   Chronic systolic heart failure (HCC)   Sepsis due to Haemophilus influenzae with acute hypoxic respiratory failure and septic shock (HCC)   Chronic atrial fibrillation (HCC)   1. Acute on chronic respiratory failure hypoxia  patient placed back on ventilator full support we will continue to monitor patient closely.  Continue supportive measures. 2. COVID-19 virus infection in recovery phase 3. Pneumonia due to COVID-19, improving. 4. Chronic systolic heart failure we will continue with supportive care 5. Severe sepsis resolved 6. Chronic atrial fibrillation rate controlled at this time continue to monitor   I have personally seen and evaluated the patient, evaluated laboratory and imaging results, formulated the assessment and plan and placed orders. The Patient requires high complexity decision making for assessment and support.  Case was discussed on Rounds with the Respiratory Therapy Staff  Yevonne Pax, MD [facility] Pulmonary Critical Care Medicine Sleep Medicine

## 2019-10-15 NOTE — Progress Notes (Signed)
Pulmonary Critical Care Medicine [facility] [place]   PULMONARY CRITICAL CARE SERVICE  PROGRESS NOTE  Date of Service: 10/15/2019  Sergio Mitchell  [id number]  DOB: [birth year]   DOA: 09/16/2019  Referring Physician: Carron Curie, MD  HPI: Sergio Mitchell is a 68 y.o. male seen for follow up of Acute on Chronic Respiratory Failure.  Patient currently is on full support on assist control mode oxygen requirements are up to 100%.  Patient currently is on a PEEP of 10 saturations are actually very good right now so we will try to wean the FiO2 back down again  Medications: Reviewed on Rounds  Physical Exam:  Vitals: Temperature 99.1 pulse 99 respiratory rate 29 blood pressure 105/65  Ventilator Settings mode ventilation assist control FiO2 100% tidal volume 514 PEEP 10  . General: Comfortable at this time . Eyes: Grossly normal lids, irises & conjunctiva . ENT: grossly tongue is normal . Neck: no obvious mass . Cardiovascular: S1 S2 normal no gallop . Respiratory: No rhonchi no rales are noted at this time . Abdomen: soft . Skin: no rash seen on limited exam . Musculoskeletal: not rigid . Psychiatric:unable to assess . Neurologic: no seizure no involuntary movements         Lab Data:   Basic Metabolic Panel: Recent Labs  Lab 10/11/19 0807 10/13/19 0548 10/14/19 1007  NA 149* 148* 149*  K 3.6 4.4 5.2*  CL 102 101 102  CO2 38* 35* 35*  GLUCOSE 152* 150* 239*  BUN 35* 43* 52*  CREATININE 0.77 1.00 1.30*  CALCIUM 10.1 10.0 10.0    ABG: Recent Labs  Lab 10/10/19 1015 10/12/19 1406 10/14/19 1000 10/14/19 1848 10/14/19 1855  PHART 7.445 7.473* 7.316* 7.433 7.429  PCO2ART 61.6* 55.4* 82.0* 62.6* 63.1*  PO2ART 65.0* 143* 48.0* 101 97.8  HCO3 41.6* 39.5* 40.7* 41.1* 40.9*  O2SAT 93.2 99.3 79.6 97.9 97.3    Liver Function Tests: No results for input(s): AST, ALT, ALKPHOS, BILITOT, PROT, ALBUMIN in the last 168 hours. No results for input(s):  LIPASE, AMYLASE in the last 168 hours. No results for input(s): AMMONIA in the last 168 hours.  CBC: Recent Labs  Lab 10/11/19 0807 10/13/19 0548 10/14/19 1007  WBC 10.8* 12.9* 14.3*  HGB 12.0* 11.9* 11.7*  HCT 38.4* 38.6* 38.2*  MCV 101.1* 102.7* 103.0*  PLT 299 306 350    Cardiac Enzymes: No results for input(s): CKTOTAL, CKMB, CKMBINDEX, TROPONINI in the last 168 hours.  BNP (last 3 results) Recent Labs    10/14/19 1007  BNP 87.2    ProBNP (last 3 results) No results for input(s): PROBNP in the last 8760 hours.  Radiological Exams: Dg Chest Port 1 View  Result Date: 10/14/2019 CLINICAL DATA:  68 year old male with hypoxia, COVID pneumonia EXAM: PORTABLE CHEST 1 VIEW COMPARISON:  Prior chest x-ray 10/13/2019 FINDINGS: Tracheostomy tube remains in good position. The tip is midline and at the level of the clavicles. Stable cardiac and mediastinal contours. Borderline cardiomegaly. The patient remains rotated toward the left. Dense left basilar airspace opacity obscures the left hemidiaphragm and the Yueh left cardiac margins. Streaky airspace opacities are also present in the right base, slightly increased compared to prior. No pneumothorax. No pulmonary edema. No new airspace opacity. No acute osseous abnormality. IMPRESSION: 1. Persistent dense left basilar airspace opacity which may reflect a combination of pleural effusion, atelectasis and infiltrate. 2. Slightly increased streaky airspace opacities in the right lung base remain most consistent with atelectasis. 3.  Stable position of tracheostomy tube. Electronically Signed   By: Jacqulynn Cadet M.D.   On: 10/14/2019 10:19    Assessment/Plan Active Problems:   Acute on chronic respiratory failure with hypoxia (HCC)   COVID-19 virus infection   Pneumonia due to COVID-19 virus   Chronic systolic heart failure (HCC)   Sepsis due to Haemophilus influenzae with acute hypoxic respiratory failure and septic shock (HCC)    Chronic atrial fibrillation ([place])   1. Acute on chronic respiratory failure hypoxia plan is to try to wean FiO2 back down again.  Last chest x-ray showing some airspace disease at the bases.  CT scan also was reviewed some abnormalities noted radiolucency in the left lower lobe area 2. COVID-19 virus infection treated clinically is improved 3. Pneumonia due to COVID-19 has been treated radiologically patient does not look particularly worse 4. Chronic systolic heart failure appears to be compensated 5. Severe sepsis resolved 6. Chronic atrial fibrillation rate controlled   I have personally seen and evaluated the patient, evaluated laboratory and imaging results, formulated the assessment and plan and placed orders. The Patient requires high complexity decision making for assessment and support.  Case was discussed on Rounds with the Respiratory Therapy Staff  Allyne Gee, MD [facility] Pulmonary Critical Care Medicine Sleep Medicine

## 2019-10-16 ENCOUNTER — Other Ambulatory Visit (HOSPITAL_COMMUNITY): Payer: Medicare Other

## 2019-10-16 LAB — CBC
HCT: 34.7 % — ABNORMAL LOW (ref 39.0–52.0)
Hemoglobin: 10.4 g/dL — ABNORMAL LOW (ref 13.0–17.0)
MCH: 30.9 pg (ref 26.0–34.0)
MCHC: 30 g/dL (ref 30.0–36.0)
MCV: 103 fL — ABNORMAL HIGH (ref 80.0–100.0)
Platelets: 354 10*3/uL (ref 150–400)
RBC: 3.37 MIL/uL — ABNORMAL LOW (ref 4.22–5.81)
RDW: 14.7 % (ref 11.5–15.5)
WBC: 19.2 10*3/uL — ABNORMAL HIGH (ref 4.0–10.5)
nRBC: 0 % (ref 0.0–0.2)

## 2019-10-16 LAB — BLOOD GAS, ARTERIAL
Acid-Base Excess: 15.6 mmol/L — ABNORMAL HIGH (ref 0.0–2.0)
Bicarbonate: 39.8 mmol/L — ABNORMAL HIGH (ref 20.0–28.0)
FIO2: 80
O2 Saturation: 93.2 %
Patient temperature: 37
pCO2 arterial: 47.5 mmHg (ref 32.0–48.0)
pH, Arterial: 7.533 — ABNORMAL HIGH (ref 7.350–7.450)
pO2, Arterial: 62.8 mmHg — ABNORMAL LOW (ref 83.0–108.0)

## 2019-10-16 LAB — BASIC METABOLIC PANEL
Anion gap: 9 (ref 5–15)
BUN: 49 mg/dL — ABNORMAL HIGH (ref 8–23)
CO2: 38 mmol/L — ABNORMAL HIGH (ref 22–32)
Calcium: 9.6 mg/dL (ref 8.9–10.3)
Chloride: 103 mmol/L (ref 98–111)
Creatinine, Ser: 1.06 mg/dL (ref 0.61–1.24)
GFR calc Af Amer: >60 mL/min (ref >60)
GFR calc non Af Amer: >60 mL/min (ref >60)
Glucose, Bld: 178 mg/dL — ABNORMAL HIGH (ref 70–99)
Potassium: 3.5 mmol/L (ref 3.5–5.1)
Sodium: 150 mmol/L — ABNORMAL HIGH (ref 135–145)

## 2019-10-16 NOTE — Progress Notes (Signed)
Pulmonary Critical Care Medicine [facility] [place]   PULMONARY CRITICAL CARE SERVICE  PROGRESS NOTE  Date of Service: 10/16/2019  Sergio Mitchell  OZD:[id number]  DOB: September 11, 1951   DOA: 09/16/2019  Referring Physician: Carron Curie, MD  HPI: Sergio Mitchell is a 68 y.o. male seen for follow up of Acute on Chronic Respiratory Failure.  Patient at this time is on 80% FiO2 which has been decreased from 100% FiO2 that patient was on  Medications: Reviewed on Rounds  Physical Exam:  Vitals: Temperature 100.5 pulse 97 respiratory 18 blood pressure 138/65 saturations 95%  Ventilator Settings mode ventilation assist control FiO2 80% tidal volume 450 PEEP 10  . General: Comfortable at this time . Eyes: Grossly normal lids, irises & conjunctiva . ENT: grossly tongue is normal . Neck: no obvious mass . Cardiovascular: S1 S2 normal no gallop . Respiratory: No rhonchi coarse breath sounds . Abdomen: soft . Skin: no rash seen on limited exam . Musculoskeletal: not rigid . Psychiatric:unable to assess . Neurologic: no seizure no involuntary movements         Lab Data:   Basic Metabolic Panel: Recent Labs  Lab 10/11/19 0807 10/13/19 0548 10/14/19 1007 10/16/19 0547  NA 149* 148* 149* 150*  K 3.6 4.4 5.2* 3.5  CL 102 101 102 103  CO2 38* 35* 35* 38*  GLUCOSE 152* 150* 239* 178*  BUN 35* 43* 52* 49*  CREATININE 0.77 1.00 1.30* 1.06  CALCIUM 10.1 10.0 10.0 9.6    ABG: Recent Labs  Lab 10/12/19 1406 10/14/19 1000 10/14/19 1848 10/14/19 1855 10/16/19 0920  PHART 7.473* 7.316* 7.433 7.429 7.533*  PCO2ART 55.4* 82.0* 62.6* 63.1* 47.5  PO2ART 143* 48.0* 101 97.8 62.8*  HCO3 39.5* 40.7* 41.1* 40.9* 39.8*  O2SAT 99.3 79.6 97.9 97.3 93.2    Liver Function Tests: No results for input(s): AST, ALT, ALKPHOS, BILITOT, PROT, ALBUMIN in the last 168 hours. No results for input(s): LIPASE, AMYLASE in the last 168 hours. No results for input(s): AMMONIA in the last  168 hours.  CBC: Recent Labs  Lab 10/11/19 0807 10/13/19 0548 10/14/19 1007 10/16/19 0547  WBC 10.8* 12.9* 14.3* 19.2*  HGB 12.0* 11.9* 11.7* 10.4*  HCT 38.4* 38.6* 38.2* 34.7*  MCV 101.1* 102.7* 103.0* 103.0*  PLT 299 306 350 354    Cardiac Enzymes: No results for input(s): CKTOTAL, CKMB, CKMBINDEX, TROPONINI in the last 168 hours.  BNP (last 3 results) Recent Labs    10/14/19 1007  BNP 87.2    ProBNP (last 3 results) No results for input(s): PROBNP in the last 8760 hours.  Radiological Exams: Dg Chest Port 1 View  Result Date: 10/16/2019 CLINICAL DATA:  Hypoxia. EXAM: PORTABLE CHEST 1 VIEW COMPARISON:  October 14, 2019. FINDINGS: Stable cardiomegaly. Tracheostomy tube is in good position. No pneumothorax is noted. Minimal right basilar subsegmental atelectasis is noted. Stable left basilar atelectasis or infiltrate is noted with probable associated pleural effusion. Bony thorax is unremarkable. IMPRESSION: Stable left basilar atelectasis or infiltrate is noted with probable associated pleural effusion. Electronically Signed   By: Lupita Raider M.D.   On: 10/16/2019 09:57    Assessment/Plan Active Problems:   Acute on chronic respiratory failure with hypoxia (HCC)   COVID-19 virus infection   Pneumonia due to COVID-19 virus   Chronic systolic heart failure (HCC)   Sepsis due to Haemophilus influenzae with acute hypoxic respiratory failure and septic shock (HCC)   Chronic atrial fibrillation (HCC)   1. Acute  on chronic respiratory failure with hypoxia we will do a follow-up chest x-ray which reveals stable infiltrates.  We will consider doing a CT angiogram for the severe hypoxia 2. COVID-19 virus infection treated in resolution phase 3. Pneumonia due to COVID-19 slowly improving 4. Chronic systolic heart failure appears to be compensated 5. Severe sepsis resolved 6. Chronic atrial fibrillation rate is controlled at this time   I have personally seen and  evaluated the patient, evaluated laboratory and imaging results, formulated the assessment and plan and placed orders. The Patient requires high complexity decision making for assessment and support.  Case was discussed on Rounds with the Respiratory Therapy Staff  Allyne Gee, MD [facility] Pulmonary Critical Care Medicine Sleep Medicine

## 2019-10-17 ENCOUNTER — Emergency Department (HOSPITAL_COMMUNITY)
Admission: EM | Admit: 2019-10-17 | Discharge: 2019-10-18 | Disposition: A | Discharge status: Home / Self Care | Payer: Medicare Other | Attending: Emergency Medicine | Admitting: Emergency Medicine

## 2019-10-17 ENCOUNTER — Emergency Department (HOSPITAL_COMMUNITY): Payer: Medicare Other

## 2019-10-17 ENCOUNTER — Other Ambulatory Visit: Payer: Self-pay

## 2019-10-17 ENCOUNTER — Encounter (HOSPITAL_COMMUNITY): Payer: Self-pay | Admitting: Emergency Medicine

## 2019-10-17 ENCOUNTER — Other Ambulatory Visit (HOSPITAL_COMMUNITY): Payer: Medicare Other

## 2019-10-17 DIAGNOSIS — Z87891 Personal history of nicotine dependence: Secondary | ICD-10-CM | POA: Insufficient documentation

## 2019-10-17 DIAGNOSIS — I5042 Chronic combined systolic (congestive) and diastolic (congestive) heart failure: Secondary | ICD-10-CM | POA: Diagnosis not present

## 2019-10-17 DIAGNOSIS — J9312 Secondary spontaneous pneumothorax: Secondary | ICD-10-CM

## 2019-10-17 DIAGNOSIS — R600 Localized edema: Secondary | ICD-10-CM | POA: Insufficient documentation

## 2019-10-17 DIAGNOSIS — J9383 Other pneumothorax: Secondary | ICD-10-CM | POA: Insufficient documentation

## 2019-10-17 DIAGNOSIS — Z930 Tracheostomy status: Secondary | ICD-10-CM | POA: Insufficient documentation

## 2019-10-17 DIAGNOSIS — R509 Fever, unspecified: Secondary | ICD-10-CM | POA: Diagnosis not present

## 2019-10-17 DIAGNOSIS — Z8709 Personal history of other diseases of the respiratory system: Secondary | ICD-10-CM | POA: Insufficient documentation

## 2019-10-17 DIAGNOSIS — Z931 Gastrostomy status: Secondary | ICD-10-CM | POA: Diagnosis not present

## 2019-10-17 DIAGNOSIS — I4891 Unspecified atrial fibrillation: Secondary | ICD-10-CM | POA: Diagnosis not present

## 2019-10-17 DIAGNOSIS — Z79899 Other long term (current) drug therapy: Secondary | ICD-10-CM | POA: Insufficient documentation

## 2019-10-17 DIAGNOSIS — D649 Anemia, unspecified: Secondary | ICD-10-CM | POA: Diagnosis not present

## 2019-10-17 LAB — CBC WITH DIFFERENTIAL/PLATELET
Abs Immature Granulocytes: 0.35 10*3/uL — ABNORMAL HIGH (ref 0.00–0.07)
Basophils Absolute: 0.1 10*3/uL (ref 0.0–0.1)
Basophils Relative: 1 %
Eosinophils Absolute: 0 10*3/uL (ref 0.0–0.5)
Eosinophils Relative: 0 %
HCT: 32.8 % — ABNORMAL LOW (ref 39.0–52.0)
Hemoglobin: 9.8 g/dL — ABNORMAL LOW (ref 13.0–17.0)
Immature Granulocytes: 2 %
Lymphocytes Relative: 8 %
Lymphs Abs: 1.4 10*3/uL (ref 0.7–4.0)
MCH: 30.7 pg (ref 26.0–34.0)
MCHC: 29.9 g/dL — ABNORMAL LOW (ref 30.0–36.0)
MCV: 102.8 fL — ABNORMAL HIGH (ref 80.0–100.0)
Monocytes Absolute: 2.3 10*3/uL — ABNORMAL HIGH (ref 0.1–1.0)
Monocytes Relative: 13 %
Neutro Abs: 13.6 10*3/uL — ABNORMAL HIGH (ref 1.7–7.7)
Neutrophils Relative %: 76 %
Platelets: 313 10*3/uL (ref 150–400)
RBC: 3.19 MIL/uL — ABNORMAL LOW (ref 4.22–5.81)
RDW: 14.8 % (ref 11.5–15.5)
WBC: 17.7 10*3/uL — ABNORMAL HIGH (ref 4.0–10.5)
nRBC: 0.2 % (ref 0.0–0.2)

## 2019-10-17 LAB — BASIC METABOLIC PANEL
Anion gap: 10 (ref 5–15)
BUN: 49 mg/dL — ABNORMAL HIGH (ref 8–23)
CO2: 37 mmol/L — ABNORMAL HIGH (ref 22–32)
Calcium: 9.6 mg/dL (ref 8.9–10.3)
Chloride: 101 mmol/L (ref 98–111)
Creatinine, Ser: 0.97 mg/dL (ref 0.61–1.24)
GFR calc Af Amer: >60 mL/min (ref >60)
GFR calc non Af Amer: >60 mL/min (ref >60)
Glucose, Bld: 132 mg/dL — ABNORMAL HIGH (ref 70–99)
Potassium: 3.9 mmol/L (ref 3.5–5.1)
Sodium: 148 mmol/L — ABNORMAL HIGH (ref 135–145)

## 2019-10-17 LAB — VANCOMYCIN, TROUGH: Vancomycin Tr: 6 ug/mL — ABNORMAL LOW (ref 15–20)

## 2019-10-17 LAB — POCT I-STAT 7, (LYTES, BLD GAS, ICA,H+H)
Acid-Base Excess: 17 mmol/L — ABNORMAL HIGH (ref 0.0–2.0)
Bicarbonate: 40 mmol/L — ABNORMAL HIGH (ref 20.0–28.0)
Calcium, Ion: 1.28 mmol/L (ref 1.15–1.40)
HCT: 27 % — ABNORMAL LOW (ref 39.0–52.0)
Hemoglobin: 9.2 g/dL — ABNORMAL LOW (ref 13.0–17.0)
O2 Saturation: 92 %
Patient temperature: 98.6
Potassium: 3.7 mmol/L (ref 3.5–5.1)
Sodium: 144 mmol/L (ref 135–145)
TCO2: 41 mmol/L — ABNORMAL HIGH (ref 22–32)
pCO2 arterial: 41.3 mmHg (ref 32.0–48.0)
pH, Arterial: 7.595 — ABNORMAL HIGH (ref 7.350–7.450)
pO2, Arterial: 53 mmHg — ABNORMAL LOW (ref 83.0–108.0)

## 2019-10-17 LAB — PROTIME-INR
INR: 1.3 — ABNORMAL HIGH (ref 0.8–1.2)
Prothrombin Time: 15.6 seconds — ABNORMAL HIGH (ref 11.4–15.2)

## 2019-10-17 MED ORDER — IOHEXOL 350 MG/ML SOLN
100.0000 mL | Freq: Once | INTRAVENOUS | Status: AC | PRN
  Administered 2019-10-17: 17:00:00 100 mL via INTRAVENOUS

## 2019-10-17 MED ORDER — SODIUM CHLORIDE 0.9 % IV BOLUS
1000.0000 mL | Freq: Once | INTRAVENOUS | Status: AC
  Administered 2019-10-18: 1000 mL via INTRAVENOUS

## 2019-10-17 NOTE — Progress Notes (Signed)
Pulmonary Critical Care Medicine [facility] [place]   PULMONARY CRITICAL CARE SERVICE  PROGRESS NOTE  Date of Service: 10/17/2019  [name]  JXB:[id number]  DOB: February 12, 1951   DOA: 09/16/2019  Referring Physician: Carron Curie, MD  HPI: Sergio Mitchell is a 68 y.o. male seen for follow up of Acute on Chronic Respiratory Failure.  Patient is on full support on assist control mode oxygen requirements have come down.  I believe that primary care team is going to order a CT scan as recommended  Medications: Reviewed on Rounds  Physical Exam:  Vitals: Temperature 99.5 pulse 92 respiratory 25 blood pressure 11/27/1964 saturations 94%  Ventilator Settings mode of ventilation assist control FiO2 45% tidal line 522 PEEP 10  . General: Comfortable at this time . Eyes: Grossly normal lids, irises & conjunctiva . ENT: grossly tongue is normal . Neck: no obvious mass . Cardiovascular: S1 S2 normal no gallop . Respiratory: No rhonchi no rales are noted at this time . Abdomen: soft . Skin: no rash seen on limited exam . Musculoskeletal: not rigid . Psychiatric:unable to assess . Neurologic: no seizure no involuntary movements         Lab Data:   Basic Metabolic Panel: Recent Labs  Lab 10/11/19 0807 10/13/19 0548 10/14/19 1007 10/16/19 0547  NA 149* 148* 149* 150*  K 3.6 4.4 5.2* 3.5  CL 102 101 102 103  CO2 38* 35* 35* 38*  GLUCOSE 152* 150* 239* 178*  BUN 35* 43* 52* 49*  CREATININE 0.77 1.00 1.30* 1.06  CALCIUM 10.1 10.0 10.0 9.6    ABG: Recent Labs  Lab 10/12/19 1406 10/14/19 1000 10/14/19 1848 10/14/19 1855 10/16/19 0920  PHART 7.473* 7.316* 7.433 7.429 7.533*  PCO2ART 55.4* 82.0* 62.6* 63.1* 47.5  PO2ART 143* 48.0* 101 97.8 62.8*  HCO3 39.5* 40.7* 41.1* 40.9* 39.8*  O2SAT 99.3 79.6 97.9 97.3 93.2    Liver Function Tests: No results for input(s): AST, ALT, ALKPHOS, BILITOT, PROT, ALBUMIN in the last 168 hours. No results for input(s): LIPASE,  AMYLASE in the last 168 hours. No results for input(s): AMMONIA in the last 168 hours.  CBC: Recent Labs  Lab 10/11/19 0807 10/13/19 0548 10/14/19 1007 10/16/19 0547  WBC 10.8* 12.9* 14.3* 19.2*  HGB 12.0* 11.9* 11.7* 10.4*  HCT 38.4* 38.6* 38.2* 34.7*  MCV 101.1* 102.7* 103.0* 103.0*  PLT 299 306 350 354    Cardiac Enzymes: No results for input(s): CKTOTAL, CKMB, CKMBINDEX, TROPONINI in the last 168 hours.  BNP (last 3 results) Recent Labs    10/14/19 1007  BNP 87.2    ProBNP (last 3 results) No results for input(s): PROBNP in the last 8760 hours.  Radiological Exams: Dg Chest Port 1 View  Result Date: 10/16/2019 CLINICAL DATA:  Hypoxia. EXAM: PORTABLE CHEST 1 VIEW COMPARISON:  October 14, 2019. FINDINGS: Stable cardiomegaly. Tracheostomy tube is in good position. No pneumothorax is noted. Minimal right basilar subsegmental atelectasis is noted. Stable left basilar atelectasis or infiltrate is noted with probable associated pleural effusion. Bony thorax is unremarkable. IMPRESSION: Stable left basilar atelectasis or infiltrate is noted with probable associated pleural effusion. Electronically Signed   By: Lupita Raider M.D.   On: 10/16/2019 09:57    Assessment/Plan Active Problems:   Acute on chronic respiratory failure with hypoxia (HCC)   COVID-19 virus infection   Pneumonia due to COVID-19 virus   Chronic systolic heart failure (HCC)   Sepsis due to Haemophilus influenzae with acute hypoxic  respiratory failure and septic shock (HCC)   Chronic atrial fibrillation ([place])   1. Acute on chronic respiratory failure hypoxia plan is to continue with full support on assist control will get a CT scan today to reassess 2. COVID-19 virus infection in resolution phase 3. Pneumonia due to COVID-19 treated we will follow 4. Chronic systolic heart failure compensated 5. Sepsis resolved 6. Chronic atrial fibrillation rate controlled at this time   I have personally seen  and evaluated the patient, evaluated laboratory and imaging results, formulated the assessment and plan and placed orders. The Patient requires high complexity decision making for assessment and support.  Case was discussed on Rounds with the Respiratory Therapy Staff  Allyne Gee, MD [facility] Pulmonary Critical Care Medicine Sleep Medicine

## 2019-10-17 NOTE — ED Provider Notes (Addendum)
[facility] EMERGENCY DEPARTMENT Provider Note   CSN: [id number] Arrival date & time: 10/17/19  1836     History   Chief Complaint Chief Complaint  Patient presents with  . Bleeding PEG Tube  . Pneumothorax    HPI Sergio Mitchell is a 68 y.o. male.    Level 5 caveat due to nonverbal status. HPI [facility].  Trach and PEG at baseline.  Sent in for right-sided pneumothorax.  Also reportedly had some bleeding from the PEG tube.  Patient cannot really provide much history.  Has been managed by [facility]. Past Medical History:  Diagnosis Date  . Acute on chronic respiratory failure with hypoxia ([place])   . Chronic atrial fibrillation ([place])   . Chronic systolic heart failure ([place])   . COVID-19 virus infection   . DDD (degenerative disc disease), lumbar   . Fibromyositis   . Osteoporosis   . Pneumonia due to COVID-19 virus   . Pneumothorax   . S/P percutaneous endoscopic gastrostomy (PEG) tube placement ([place])   . Scoliosis   . Sepsis due to Haemophilus influenzae with acute hypoxic respiratory failure and septic shock ([place])   . Spinal stenosis   . Systolic CHF, chronic ([place]) 04/2019   EF 20-25%, ACID recommended  . Tracheostomy tube present ([place])   . Ventilator dependence [facility])     Patient Active Problem List   Diagnosis Date Noted  . Pneumothorax   . Acute on chronic respiratory failure with hypoxia ([place])   . COVID-19 virus infection   . Pneumonia due to COVID-19 virus   . Chronic systolic heart failure ([place])   . Sepsis due to Haemophilus influenzae with acute hypoxic respiratory failure and septic shock ([place])   . Chronic atrial fibrillation ([place])   . Decubitus ulcer of sacral region, stage 1 09/09/2019  . Pressure injury of skin 09/05/2019  . Acute on chronic respiratory failure with hypoxia and hypercapnia ([place]) 09/02/2019  . Chronic systolic CHF (congestive heart failure) ([place])  09/02/2019  . VABP (ventilator-associated bacterial pneumonia) ([place]) 07/31/2019  . Acute respiratory failure with hypoxemia ([place])   . Chronic respiratory failure requiring continuous mechanical ventilation through tracheostomy ([place])   . HCAP (healthcare-associated pneumonia)   . Other insomnia 04/20/2017  . Other fatigue 04/20/2017  . Fibromyalgia 10/06/2016  . Primary osteoarthritis of both hands 10/06/2016  . 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 DDDlumbar spine 10/06/2016  . Former smoker 10/06/2016  . Depression 10/06/2016  . Anxiety 10/06/2016  . Atrial fibrillation ([place]) 10/06/2016  . Gastroesophageal reflux disease without esophagitis 10/06/2016    Past Surgical History:  Procedure Laterality Date  . CHOLECYSTECTOMY    . JOINT REPLACEMENT    . REPLACEMENT TOTAL KNEE BILATERAL Bilateral   . TONSILECTOMY, ADENOIDECTOMY, BILATERAL MYRINGOTOMY AND TUBES    . TRACHEOSTOMY          Home Medications    Prior to Admission medications   Medication Sig Start Date End Date Taking? Authorizing Provider  acetaminophen (TYLENOL) 160 MG/5ML solution Take 20.3 mLs (650 mg total) by mouth every 6 (six) hours as needed for mild pain or headache (fever > 101). 09/15/19   Cherene Altes, MD  Amino Acids-Protein Hydrolys (FEEDING SUPPLEMENT, PRO-STAT SUGAR FREE 64,) LIQD Place 60 mLs into feeding tube 2 (two) times daily. 09/15/19   Cherene Altes, MD  aspirin 81 MG chewable tablet Chew 1  tablet (81 mg total) by mouth daily. 09/16/19   Lonia Blood, MD  atorvastatin (LIPITOR) 20 MG tablet Take 1 tablet (20 mg total) by mouth daily. 09/16/19   Lonia Blood, MD  bisacodyl (DULCOLAX) 5 MG EC tablet Take 1 tablet (5 mg total) by mouth daily as needed for moderate constipation. 09/15/19   Lonia Blood, MD  carvedilol (COREG) 12.5 MG tablet Take 1 tablet (12.5 mg total) by mouth 2 (two) times daily with a meal.  09/15/19   Lonia Blood, MD  cholecalciferol (VITAMIN D) 25 MCG tablet Take 2 tablets (2,000 Units total) by mouth daily. 09/16/19   Lonia Blood, MD  clonazePAM (KLONOPIN) 1 MG tablet Take 1 tablet (1 mg total) by mouth every 8 (eight) hours. 09/15/19   Lonia Blood, MD  docusate sodium (COLACE) 100 MG capsule Take 1 capsule (100 mg total) by mouth daily. 09/16/19   Lonia Blood, MD  enoxaparin (LOVENOX) 40 MG/0.4ML injection Inject 0.4 mLs (40 mg total) into the skin daily. 09/15/19   Lonia Blood, MD  escitalopram (LEXAPRO) 10 MG tablet Take 1 tablet (10 mg total) by mouth daily. 09/16/19   Lonia Blood, MD  famotidine (PEPCID) 20 MG tablet Take 1 tablet (20 mg total) by mouth at bedtime. 09/15/19   Lonia Blood, MD  furosemide (LASIX) 40 MG tablet Take 1 tablet (40 mg total) by mouth daily. 09/16/19   Lonia Blood, MD  insulin aspart (NOVOLOG) 100 UNIT/ML injection Inject 0-5 Units into the skin at bedtime. 09/15/19   Lonia Blood, MD  insulin aspart (NOVOLOG) 100 UNIT/ML injection Inject 0-9 Units into the skin 3 (three) times daily with meals. 09/15/19   Lonia Blood, MD  ipratropium-albuterol (DUONEB) 0.5-2.5 (3) MG/3ML SOLN Take 3 mLs by nebulization every 4 (four) hours as needed. 09/15/19   Lonia Blood, MD  ipratropium-albuterol (DUONEB) 0.5-2.5 (3) MG/3ML SOLN Take 3 mLs by nebulization every 6 (six) hours. 09/15/19   Lonia Blood, MD  morphine (MSIR) 15 MG tablet Take 1 tablet (15 mg total) by mouth every 8 (eight) hours. 09/15/19   Lonia Blood, MD  ondansetron (ZOFRAN) 4 MG tablet Place 4 mg into feeding tube every 8 (eight) hours as needed for nausea or vomiting.    [provider]  ondansetron (ZOFRAN) 4 MG/2ML SOLN injection Inject 2 mLs (4 mg total) into the vein every 6 (six) hours as needed for nausea. 09/15/19   Lonia Blood, MD  oxyCODONE (OXY IR/ROXICODONE) 5 MG immediate release tablet  Take 1 tablet (5 mg total) by mouth every 4 (four) hours as needed for moderate pain. 09/15/19   Lonia Blood, MD  polyethylene glycol (MIRALAX) 17 g packet Take 17 g by mouth daily. 09/16/19   Lonia Blood, MD  potassium chloride 20 MEQ/15ML (10%) SOLN Take 30 mLs (40 mEq total) by mouth 2 (two) times daily. 09/15/19   Lonia Blood, MD  QUEtiapine (SEROQUEL) 50 MG tablet Take 1 tablet (50 mg total) by mouth every 12 (twelve) hours as needed (increased anxiousness/behavior). 09/15/19   Lonia Blood, MD  vitamin C (ASCORBIC ACID) 500 MG tablet Take 500 mg by mouth daily.    [provider]  zinc sulfate 220 (50 Zn) MG capsule Take 1 capsule (220 mg total) by mouth daily. 09/16/19   Lonia Blood, MD  zolpidem (AMBIEN) 5 MG tablet Take 1 tablet (5 mg total) by  mouth at bedtime as needed for sleep. 09/15/19   Lonia BloodMcClung, Jeffrey T, MD    Family History Family History  Problem Relation Age of Onset  . Scoliosis Mother   . Fibromyalgia Mother   . Prostate cancer Father     Social History Social History   Tobacco Use  . Smoking status: Former Smoker    Packs/day: 1.50    Years: 52.00    Pack years: 78.00    Types: Cigarettes  . Smokeless tobacco: Never Used  Substance Use Topics  . Alcohol use: No  . Drug use: No     Allergies   Bacitracin, Cefdinir, Lidocaine, Neomycin, Neosporin af [miconazole], Pantoprazole, Penicillins, Polymyxin b, and Pramoxine   Review of Systems Review of Systems  Unable to perform ROS: Patient nonverbal     Physical Exam Updated Vital Signs BP (!) 100/59   Pulse 86   Temp (!) 101.6 F (38.7 C) (Rectal)   Resp (!) 28   SpO2 95%   Physical Exam Vitals signs and nursing note reviewed.  Constitutional:      Comments: Patient will look to voice.  HENT:     Head:     Comments: Edema on face. Neck:     Comments: Patient is trached and ventilated. Cardiovascular:     Rate and Rhythm: Regular rhythm.   Pulmonary:     Comments: Decreased breath sounds on right. Abdominal:     Tenderness: There is no abdominal tenderness.     Comments: PEG tube with bleeding around it.  Musculoskeletal:     Right lower leg: Edema present.     Left lower leg: Edema present.  Neurological:     Comments: Will look to voice.  Will move left extremity some.  We will also move right arm some.      ED Treatments / Results  Labs (all labs ordered are listed, but only abnormal results are displayed) Labs Reviewed  BASIC METABOLIC PANEL - Abnormal; Notable for the following components:      Result Value   Sodium 148 (*)    CO2 37 (*)    Glucose, Bld 132 (*)    BUN 49 (*)    All other components within normal limits  CBC WITH DIFFERENTIAL/PLATELET - Abnormal; Notable for the following components:   WBC 17.7 (*)    RBC 3.19 (*)    Hemoglobin 9.8 (*)    HCT 32.8 (*)    MCV 102.8 (*)    MCHC 29.9 (*)    Neutro Abs 13.6 (*)    Monocytes Absolute 2.3 (*)    Abs Immature Granulocytes 0.35 (*)    All other components within normal limits  PROTIME-INR - Abnormal; Notable for the following components:   Prothrombin Time 15.6 (*)    INR 1.3 (*)    All other components within normal limits  POCT I-STAT 7, (LYTES, BLD GAS, ICA,H+H) - Abnormal; Notable for the following components:   pH, Arterial 7.595 (*)    pO2, Arterial 53.0 (*)    Bicarbonate 40.0 (*)    TCO2 41 (*)    Acid-Base Excess 17.0 (*)    HCT 27.0 (*)    Hemoglobin 9.2 (*)    All other components within normal limits    EKG None  Radiology No results found.  Procedures CHEST TUBE INSERTION  Date/Time: 10/17/2019 11:34 PM Performed by: Benjiman CorePickering, Fatema Rabe, MD Authorized by: Benjiman CorePickering, Jumaane Weatherford, MD   Consent:    Consent obtained:  Emergent situation  Alternatives discussed:  No treatment and delayed treatment Pre-procedure details:    Skin preparation:  ChloraPrep   Preparation: Patient was prepped and draped in the usual sterile  fashion   Anesthesia (see MAR for exact dosages):    Anesthesia method:  Local infiltration   Local anesthetic:  Lidocaine 1% w/o epi Procedure details:    Placement location:  R lateral   Scalpel size:  15   Tube size (French): pigtail.   Ultrasound guidance: no     Tension pneumothorax: no     Tube connected to:  Suction   Drainage characteristics:  Air only   Suture material:  0 silk   Dressing:  4x4 sterile gauze Post-procedure details:    Post-insertion x-ray findings: tube in good position     Patient tolerance of procedure:  Tolerated well, no immediate complications   (including critical care time)  Medications Ordered in ED Medications  sodium chloride 0.9 % bolus 1,000 mL (0 mLs Intravenous Stopped 10/17/2019 0116)     Initial Impression / Assessment and Plan / ED Course  I have reviewed the triage vital signs and the nursing notes.  Pertinent labs & imaging results that were available during my care of the patient were reviewed by me and considered in my medical decision making (see chart for details).        Patient sent in for pneumothorax.  Also has had a bleeding PEG tube.  Hemoglobin mildly decreased.  Chest tube placed with improvement of pneumothorax.  Discussed with critical care/pulmonary here.  [facility], Marisue Ivan is expecting to have the patient back.  Was only sent in in order to get the chest tube.  Does have fever here but is already being [facility].  Charge nurse had told critical care that they do have telemedicine that can manage the patient.  Will return back to that hospital.  CRITICAL CARE Performed by: Benjiman Core Total critical care time: 30 minutes Critical care time was exclusive of separately billable procedures and treating other patients. Critical care was necessary to treat or prevent imminent or life-threatening deterioration. Critical care was time spent  personally by me on the following activities: development of treatment plan with patient and/or surrogate as well as nursing, discussions with consultants, evaluation of patient's response to treatment, examination of patient, obtaining history from patient or surrogate, ordering and performing treatments and interventions, ordering and review of laboratory studies, ordering and review of radiographic studies, pulse oximetry and re-evaluation of patient's condition.  Final Clinical Impressions(s) / ED Diagnoses   Final diagnoses:  Secondary spontaneous pneumothorax  Fever, unspecified fever cause    ED Discharge Orders    None       Benjiman Core, MD 10/17/19 [id number]    Benjiman Core, MD 10/17/19 Ouida Sills    Benjiman Core, MD 10/24/19 [phone]

## 2019-10-17 NOTE — ED Triage Notes (Addendum)
Pt arrives to ED from [place] here at the hospital with complaints of bleeding around patients PEG tube today. Per [facility] patient went to CT for the bleeding PEG and found patient to have a large right pneumothorax with shift. On arrival to ED patient is trach vented with labored breathing. Area around PEG tube is still bleeding. Patient complains of abdominal pain.

## 2019-10-17 NOTE — ED Notes (Signed)
Pt had BM,  linens changed

## 2019-10-17 NOTE — Discharge Instructions (Addendum)
Have the pulmonologist follow the chest tube.  He also developed a fever while he was here.

## 2019-10-18 ENCOUNTER — Encounter: Payer: Self-pay | Admitting: Internal Medicine

## 2019-10-18 ENCOUNTER — Inpatient Hospital Stay
Admission: RE | Admit: 2019-10-18 | Discharge: 2019-11-24 | Disposition: E | Payer: Medicare Other | Attending: Internal Medicine | Admitting: Internal Medicine

## 2019-10-18 DIAGNOSIS — J9690 Respiratory failure, unspecified, unspecified whether with hypoxia or hypercapnia: Secondary | ICD-10-CM

## 2019-10-18 DIAGNOSIS — T17500A Unspecified foreign body in bronchus causing asphyxiation, initial encounter: Secondary | ICD-10-CM

## 2019-10-18 DIAGNOSIS — J9312 Secondary spontaneous pneumothorax: Secondary | ICD-10-CM

## 2019-10-18 DIAGNOSIS — J939 Pneumothorax, unspecified: Secondary | ICD-10-CM | POA: Diagnosis present

## 2019-10-18 DIAGNOSIS — I5022 Chronic systolic (congestive) heart failure: Secondary | ICD-10-CM | POA: Diagnosis present

## 2019-10-18 DIAGNOSIS — J9811 Atelectasis: Secondary | ICD-10-CM

## 2019-10-18 DIAGNOSIS — J189 Pneumonia, unspecified organism: Secondary | ICD-10-CM

## 2019-10-18 DIAGNOSIS — J1282 Pneumonia due to coronavirus disease 2019: Secondary | ICD-10-CM | POA: Diagnosis present

## 2019-10-18 DIAGNOSIS — U071 COVID-19: Secondary | ICD-10-CM | POA: Diagnosis present

## 2019-10-18 DIAGNOSIS — R0902 Hypoxemia: Secondary | ICD-10-CM

## 2019-10-18 DIAGNOSIS — Z431 Encounter for attention to gastrostomy: Secondary | ICD-10-CM

## 2019-10-18 DIAGNOSIS — Z4659 Encounter for fitting and adjustment of other gastrointestinal appliance and device: Secondary | ICD-10-CM

## 2019-10-18 DIAGNOSIS — J111 Influenza due to unidentified influenza virus with other respiratory manifestations: Secondary | ICD-10-CM

## 2019-10-18 DIAGNOSIS — J9601 Acute respiratory failure with hypoxia: Secondary | ICD-10-CM

## 2019-10-18 DIAGNOSIS — J1289 Other viral pneumonia: Secondary | ICD-10-CM

## 2019-10-18 DIAGNOSIS — J9621 Acute and chronic respiratory failure with hypoxia: Secondary | ICD-10-CM | POA: Diagnosis present

## 2019-10-18 DIAGNOSIS — A413 Sepsis due to Hemophilus influenzae: Secondary | ICD-10-CM | POA: Diagnosis present

## 2019-10-18 DIAGNOSIS — J9809 Other diseases of bronchus, not elsewhere classified: Secondary | ICD-10-CM

## 2019-10-18 DIAGNOSIS — Z931 Gastrostomy status: Secondary | ICD-10-CM

## 2019-10-18 DIAGNOSIS — R6521 Severe sepsis with septic shock: Secondary | ICD-10-CM

## 2019-10-18 HISTORY — DX: Pneumothorax, unspecified: J93.9

## 2019-10-18 NOTE — Progress Notes (Signed)
Pulmonary Critical Care Medicine [facility] [place]   PULMONARY CRITICAL CARE SERVICE  PROGRESS NOTE  Date of Service: 20-Oct-2019  LARAMIE MEISSNER  NWG:[id number]  DOB: 16-Jun-1951   DOA: 10-20-19  Referring Physician: Carron Curie, MD  HPI: DELWYN SCOGGIN is a 68 y.o. male seen for follow up of Acute on Chronic Respiratory Failure.  Patient did have a CT scan done yesterday and it showed a fairly large pneumothorax which was not clearly seen on the chest x-rays.  Patient now has a chest tube then and his oxygenation should be improved  Medications: Reviewed on Rounds  Physical Exam:  Vitals: Temperature 98.9 pulse 76 respiratory rate 25 blood pressure 107/60 saturations 97%  Ventilator Settings mode ventilation assist control FiO2 60% tidal volume 518 PEEP 10  . General: Comfortable at this time . Eyes: Grossly normal lids, irises & conjunctiva . ENT: grossly tongue is normal . Neck: no obvious mass . Cardiovascular: S1 S2 normal no gallop . Respiratory: No rhonchi coarse breath sounds are noted at this time . Abdomen: soft . Skin: no rash seen on limited exam . Musculoskeletal: not rigid . Psychiatric:unable to assess . Neurologic: no seizure no involuntary movements         Lab Data:   Basic Metabolic Panel: Recent Labs  Lab 10/13/19 0548 10/14/19 1007 10/16/19 0547 10/17/19 1857 10/17/19 2012  NA 148* 149* 150* 148* 144  K 4.4 5.2* 3.5 3.9 3.7  CL 101 102 103 101  --   CO2 35* 35* 38* 37*  --   GLUCOSE 150* 239* 178* 132*  --   BUN 43* 52* 49* 49*  --   CREATININE 1.00 1.30* 1.06 0.97  --   CALCIUM 10.0 10.0 9.6 9.6  --     ABG: Recent Labs  Lab 10/14/19 1000 10/14/19 1848 10/14/19 1855 10/16/19 0920 10/17/19 2012  PHART 7.316* 7.433 7.429 7.533* 7.595*  PCO2ART 82.0* 62.6* 63.1* 47.5 41.3  PO2ART 48.0* 101 97.8 62.8* 53.0*  HCO3 40.7* 41.1* 40.9* 39.8* 40.0*  O2SAT 79.6 97.9 97.3 93.2 92.0    Liver Function Tests: No results for  input(s): AST, ALT, ALKPHOS, BILITOT, PROT, ALBUMIN in the last 168 hours. No results for input(s): LIPASE, AMYLASE in the last 168 hours. No results for input(s): AMMONIA in the last 168 hours.  CBC: Recent Labs  Lab 10/13/19 0548 10/14/19 1007 10/16/19 0547 10/17/19 1857 10/17/19 2012  WBC 12.9* 14.3* 19.2* 17.7*  --   NEUTROABS  --   --   --  13.6*  --   HGB 11.9* 11.7* 10.4* 9.8* 9.2*  HCT 38.6* 38.2* 34.7* 32.8* 27.0*  MCV 102.7* 103.0* 103.0* 102.8*  --   PLT 306 350 354 313  --     Cardiac Enzymes: No results for input(s): CKTOTAL, CKMB, CKMBINDEX, TROPONINI in the last 168 hours.  BNP (last 3 results) Recent Labs    10/14/19 1007  BNP 87.2    ProBNP (last 3 results) No results for input(s): PROBNP in the last 8760 hours.  Radiological Exams: Ct Angio Chest Pe W Or Wo Contrast  Result Date: 10/17/2019 CLINICAL DATA:  68 year old male with concern for pulmonary embolism. COVID-19. EXAM: CT ANGIOGRAPHY CHEST CT ABDOMEN AND PELVIS WITH CONTRAST TECHNIQUE: Multidetector CT imaging of the chest was performed using the standard protocol during bolus administration of intravenous contrast. Multiplanar CT image reconstructions and MIPs were obtained to evaluate the vascular anatomy. Multidetector CT imaging of the abdomen and pelvis was performed  using the standard protocol during bolus administration of intravenous contrast. CONTRAST:  100mL OMNIPAQUE IOHEXOL 350 MG/ML SOLN COMPARISON:  Chest radiograph dated 10/14/2019. FINDINGS: Evaluation is limited due to streak artifact caused by patient's arms. CTA CHEST FINDINGS Cardiovascular: There is no cardiomegaly. Trace pericardial effusion measuring 5 mm anterior to the heart. Minimal atherosclerotic calcification of the thoracic aorta. No aneurysmal dilatation. There is mild dilatation of the main pulmonary trunk suggestive of a degree of pulmonary hypertension. Evaluation of the pulmonary arteries is limited due to streak artifact  caused by patient's arms and suboptimal opacification of the peripheral branches. No pulmonary artery embolus identified. Mediastinum/Nodes: No hilar or mediastinal adenopathy. Calcified granuloma noted. The esophagus is grossly unremarkable. No mediastinal fluid collection. Lungs/Pleura: There is a large right pneumothorax (20% or greater) with shift of the mediastinum into the left hemithorax concerning for a degree of tension. There is background of emphysema. Consolidative changes of the majority of the left lower lobe with air bronchogram may represent atelectasis or infiltrate. Aspiration is not excluded. There are consolidative changes of the right lung base also atelectasis versus infiltrate. Areas of interstitial coarsening involving the anterior subpleural region of the left upper lobe and lingula as well as right middle lobe. A tracheostomy with tip above the carina. There is mucous secretion within the left lower lobe bronchi. There is extension of the pneumothorax into the diaphragmatic hiatus. No pleural effusion. Musculoskeletal: No acute osseous pathology. Review of the MIP images confirms the above findings. CT ABDOMEN and PELVIS FINDINGS No intra-abdominal free air or free fluid. Hepatobiliary: Probable background of fatty infiltration of the liver. Subcentimeter right hepatic hypodense focus is not characterized. No intrahepatic biliary ductal dilatation. Cholecystectomy. Pancreas: Unremarkable. No pancreatic ductal dilatation or surrounding inflammatory changes. Spleen: Small scattered calcified splenic granuloma. Adrenals/Urinary Tract: The right adrenal gland is unremarkable. Minimal nodularity of the left adrenal gland. Subcentimeter bilateral renal hypodense lesions are too small to characterize. There is no hydronephrosis on either side. There is symmetric enhancement and excretion of contrast by both kidneys. The visualized ureters appear unremarkable. Mild trabeculated appearance of the  bladder wall may be related to chronic bladder outlet obstruction. Stomach/Bowel: There is a percutaneous gastrostomy with balloon in the distal stomach. There is no bowel obstruction or active inflammation. The appendix is normal. Vascular/Lymphatic: Mild aortoiliac atherosclerotic disease. The IVC is unremarkable. No portal venous gas. There is no adenopathy. Reproductive: The prostate and seminal vesicles are grossly unremarkable. Other: None Musculoskeletal: Multilevel degenerative changes of the spine. No acute osseous pathology. Review of the MIP images confirms the above findings. IMPRESSION: 1. Large right pneumothorax with shift of the mediastinum into the left hemithorax concerning for a degree of tension. 2. No CT evidence of pulmonary artery embolus. 3. Emphysema. 4. Bilateral lower lobe consolidative changes may represent atelectasis or infiltrate. Aspiration is not excluded. Clinical correlation is recommended. 5. No acute intra-abdominal or pelvic pathology. No bowel obstruction or active inflammation. Normal appendix. 6. Aortic Atherosclerosis (ICD10-I70.0) and Emphysema (ICD10-J43.9). These results were called by telephone at the time of interpretation on 10/17/2019 at 5:30 pm to provider Deakins, who verbally acknowledged these results. Electronically Signed   By: Elgie CollardArash  Radparvar M.D.   On: 10/17/2019 17:34   Ct Abdomen Pelvis W Contrast  Result Date: 10/17/2019 CLINICAL DATA:  68 year old male with concern for pulmonary embolism. COVID-19. EXAM: CT ANGIOGRAPHY CHEST CT ABDOMEN AND PELVIS WITH CONTRAST TECHNIQUE: Multidetector CT imaging of the chest was performed using the standard protocol during bolus  administration of intravenous contrast. Multiplanar CT image reconstructions and MIPs were obtained to evaluate the vascular anatomy. Multidetector CT imaging of the abdomen and pelvis was performed using the standard protocol during bolus administration of intravenous contrast. CONTRAST:   OMNIPAQUE IOHEXOL 350 MG/ML SOLN COMPARISON:  Chest radiograph dated 10/14/2019. FINDINGS: Evaluation is limited due to streak artifact caused by patient's arms. CTA CHEST FINDINGS Cardiovascular: There is no cardiomegaly. Trace pericardial effusion measuring 5 mm anterior to the heart. Minimal atherosclerotic calcification of the thoracic aorta. No aneurysmal dilatation. There is mild dilatation of the main pulmonary trunk suggestive of a degree of pulmonary hypertension. Evaluation of the pulmonary arteries is limited due to streak artifact caused by patient's arms and suboptimal opacification of the peripheral branches. No pulmonary artery embolus identified. Mediastinum/Nodes: No hilar or mediastinal adenopathy. Calcified granuloma noted. The esophagus is grossly unremarkable. No mediastinal fluid collection. Lungs/Pleura: There is a large right pneumothorax (20% or greater) with shift of the mediastinum into the left hemithorax concerning for a degree of tension. There is background of emphysema. Consolidative changes of the majority of the left lower lobe with air bronchogram may represent atelectasis or infiltrate. Aspiration is not excluded. There are consolidative changes of the right lung base also atelectasis versus infiltrate. Areas of interstitial coarsening involving the anterior subpleural region of the left upper lobe and lingula as well as right middle lobe. A tracheostomy with tip above the carina. There is mucous secretion within the left lower lobe bronchi. There is extension of the pneumothorax into the diaphragmatic hiatus. No pleural effusion. Musculoskeletal: No acute osseous pathology. Review of the MIP images confirms the above findings. CT ABDOMEN and PELVIS FINDINGS No intra-abdominal free air or free fluid. Hepatobiliary: Probable background of fatty infiltration of the liver. Subcentimeter right hepatic hypodense focus is not characterized. No intrahepatic biliary ductal  dilatation. Cholecystectomy. Pancreas: Unremarkable. No pancreatic ductal dilatation or surrounding inflammatory changes. Spleen: Small scattered calcified splenic granuloma. Adrenals/Urinary Tract: The right adrenal gland is unremarkable. Minimal nodularity of the left adrenal gland. Subcentimeter bilateral renal hypodense lesions are too small to characterize. There is no hydronephrosis on either side. There is symmetric enhancement and excretion of contrast by both kidneys. The visualized ureters appear unremarkable. Mild trabeculated appearance of the bladder wall may be related to chronic bladder outlet obstruction. Stomach/Bowel: There is a percutaneous gastrostomy with balloon in the distal stomach. There is no bowel obstruction or active inflammation. The appendix is normal. Vascular/Lymphatic: Mild aortoiliac atherosclerotic disease. The IVC is unremarkable. No portal venous gas. There is no adenopathy. Reproductive: The prostate and seminal vesicles are grossly unremarkable. Other: None Musculoskeletal: Multilevel degenerative changes of the spine. No acute osseous pathology. Review of the MIP images confirms the above findings. IMPRESSION: 1. Large right pneumothorax with shift of the mediastinum into the left hemithorax concerning for a degree of tension. 2. No CT evidence of pulmonary artery embolus. 3. Emphysema. 4. Bilateral lower lobe consolidative changes may represent atelectasis or infiltrate. Aspiration is not excluded. Clinical correlation is recommended. 5. No acute intra-abdominal or pelvic pathology. No bowel obstruction or active inflammation. Normal appendix. 6. Aortic Atherosclerosis (ICD10-I70.0) and Emphysema (ICD10-J43.9). These results were called by telephone at the time of interpretation on 10/17/2019 at 5:30 pm to provider Deakins, who verbally acknowledged these results. Electronically Signed   By: Elgie Collard M.D.   On: 10/17/2019 17:34   Dg Chest Portable 1 View  Result  Date: 10/17/2019 CLINICAL DATA:  Chest tube placement EXAM: PORTABLE  CHEST 1 VIEW COMPARISON:  10/16/2019, 10/17/2019 FINDINGS: Tracheostomy tube is in place. Interim placement of right-sided chest tube with pigtail over the mid chest. Probable small pneumothorax at the right apex and CP angle. Dense airspace disease at the left lung base. Small amount of chest wall emphysema on the right. IMPRESSION: 1. Right-sided chest tube as above. Suspect small right apical and CP angle pneumothorax. 2. Persistent dense airspace disease at the left lung base. Atelectasis or pneumonia at the right base. Electronically Signed   By: Donavan Foil M.D.   On: 10/17/2019 21:35    Assessment/Plan Active Problems:   Chronic systolic CHF (congestive heart failure) (HCC)   Acute on chronic respiratory failure with hypoxia (HCC)   COVID-19 virus infection   Pneumonia due to COVID-19 virus   Sepsis due to Haemophilus influenzae with acute hypoxic respiratory failure and septic shock (HCC)   Pneumothorax   1. Acute on chronic respiratory failure with hypoxia patient continues on the ventilator had been on 80% FiO2 and had respiratory therapy dropping down to 60% saturations remained at 99% so we will decrease further down to 50% and drop the PEEP also 2. Chronic systolic heart failure right now is compensated we will continue with supportive care 3. Pneumothorax patient has chest tube in place we will continue to monitor closely. 4. COVID-19 virus infection in resolution phase 5. Pneumonia due to COVID-19 treated slowly resolving 6. Severe sepsis resolved   I have personally seen and evaluated the patient, evaluated laboratory and imaging results, formulated the assessment and plan and placed orders. The Patient requires high complexity decision making for assessment and support.  Case was discussed on Rounds with the Respiratory Therapy Staff  Allyne Gee, MD [facility] Pulmonary Critical Care Medicine Sleep  Medicine

## 2019-10-19 ENCOUNTER — Institutional Professional Consult (permissible substitution) (HOSPITAL_COMMUNITY): Payer: Medicare Other

## 2019-10-19 ENCOUNTER — Other Ambulatory Visit (HOSPITAL_COMMUNITY): Payer: Medicare Other

## 2019-10-19 LAB — CBC
HCT: 27.9 % — ABNORMAL LOW (ref 39.0–52.0)
Hemoglobin: 8.6 g/dL — ABNORMAL LOW (ref 13.0–17.0)
MCH: 30.7 pg (ref 26.0–34.0)
MCHC: 30.8 g/dL (ref 30.0–36.0)
MCV: 99.6 fL (ref 80.0–100.0)
Platelets: 283 10*3/uL (ref 150–400)
RBC: 2.8 MIL/uL — ABNORMAL LOW (ref 4.22–5.81)
RDW: 14.6 % (ref 11.5–15.5)
WBC: 13.4 10*3/uL — ABNORMAL HIGH (ref 4.0–10.5)
nRBC: 0.4 % — ABNORMAL HIGH (ref 0.0–0.2)

## 2019-10-19 LAB — BASIC METABOLIC PANEL
Anion gap: 11 (ref 5–15)
BUN: 37 mg/dL — ABNORMAL HIGH (ref 8–23)
CO2: 33 mmol/L — ABNORMAL HIGH (ref 22–32)
Calcium: 8.9 mg/dL (ref 8.9–10.3)
Chloride: 101 mmol/L (ref 98–111)
Creatinine, Ser: 0.71 mg/dL (ref 0.61–1.24)
GFR calc Af Amer: >60 mL/min (ref >60)
GFR calc non Af Amer: >60 mL/min (ref >60)
Glucose, Bld: 105 mg/dL — ABNORMAL HIGH (ref 70–99)
Potassium: 3.4 mmol/L — ABNORMAL LOW (ref 3.5–5.1)
Sodium: 145 mmol/L (ref 135–145)

## 2019-10-19 LAB — VANCOMYCIN, TROUGH: Vancomycin Tr: 19 ug/mL (ref 15–20)

## 2019-10-19 MED ORDER — IOHEXOL 300 MG/ML  SOLN: 50.0000 mL | Freq: Once | INTRAMUSCULAR | Status: DC | PRN

## 2019-10-19 NOTE — Progress Notes (Signed)
Pulmonary Critical Care Medicine [facility] [place]   PULMONARY CRITICAL CARE SERVICE  PROGRESS NOTE  Date of Service: 10/19/2019  Sergio Mitchell  ZLD:[id number]  DOB: 07/30/1951   DOA: 10/19/2019  Referring Physician: Carron Curie, MD  HPI: Sergio Mitchell is a 68 y.o. male seen for follow up of Acute on Chronic Respiratory Failure.  Patient currently is on the ventilator FiO2 has been decreased down to 35% seems to be doing a little bit better today.  No leak is seen in the chest tube.  Medications: Reviewed on Rounds  Physical Exam:  Vitals: Temperature 99.6 pulse 72 respiratory 27 blood pressure 125/50 saturations 96%  Ventilator Settings patient is on full support assist control mode at this time FiO2 is 35% tidal volume 500 PEEP was decreased to 8  . General: Comfortable at this time . Eyes: Grossly normal lids, irises & conjunctiva . ENT: grossly tongue is normal . Neck: no obvious mass . Cardiovascular: S1 S2 normal no gallop . Respiratory: No rhonchi no rales are noted at this time . Abdomen: soft . Skin: no rash seen on limited exam . Musculoskeletal: not rigid . Psychiatric:unable to assess . Neurologic: no seizure no involuntary movements         Lab Data:   Basic Metabolic Panel: Recent Labs  Lab 10/13/19 0548 10/14/19 1007 10/16/19 0547 10/17/19 1857 10/17/19 2012 10/19/19 0346  NA 148* 149* 150* 148* 144 145  K 4.4 5.2* 3.5 3.9 3.7 3.4*  CL 101 102 103 101  --  101  CO2 35* 35* 38* 37*  --  33*  GLUCOSE 150* 239* 178* 132*  --  105*  BUN 43* 52* 49* 49*  --  37*  CREATININE 1.00 1.30* 1.06 0.97  --  0.71  CALCIUM 10.0 10.0 9.6 9.6  --  8.9    ABG: Recent Labs  Lab 10/14/19 1000 10/14/19 1848 10/14/19 1855 10/16/19 0920 10/17/19 2012  PHART 7.316* 7.433 7.429 7.533* 7.595*  PCO2ART 82.0* 62.6* 63.1* 47.5 41.3  PO2ART 48.0* 101 97.8 62.8* 53.0*  HCO3 40.7* 41.1* 40.9* 39.8* 40.0*  O2SAT 79.6 97.9 97.3 93.2 92.0    Liver  Function Tests: No results for input(s): AST, ALT, ALKPHOS, BILITOT, PROT, ALBUMIN in the last 168 hours. No results for input(s): LIPASE, AMYLASE in the last 168 hours. No results for input(s): AMMONIA in the last 168 hours.  CBC: Recent Labs  Lab 10/13/19 0548 10/14/19 1007 10/16/19 0547 10/17/19 1857 10/17/19 2012 10/19/19 0346  WBC 12.9* 14.3* 19.2* 17.7*  --  13.4*  NEUTROABS  --   --   --  13.6*  --   --   HGB 11.9* 11.7* 10.4* 9.8* 9.2* 8.6*  HCT 38.6* 38.2* 34.7* 32.8* 27.0* 27.9*  MCV 102.7* 103.0* 103.0* 102.8*  --  99.6  PLT 306 350 354 313  --  283    Cardiac Enzymes: No results for input(s): CKTOTAL, CKMB, CKMBINDEX, TROPONINI in the last 168 hours.  BNP (last 3 results) Recent Labs    10/14/19 1007  BNP 87.2    ProBNP (last 3 results) No results for input(s): PROBNP in the last 8760 hours.  Radiological Exams: Ct Angio Chest Pe W Or Wo Contrast  Result Date: 10/17/2019 CLINICAL DATA:  68 year old male with concern for pulmonary embolism. COVID-19. EXAM: CT ANGIOGRAPHY CHEST CT ABDOMEN AND PELVIS WITH CONTRAST TECHNIQUE: Multidetector CT imaging of the chest was performed using the standard protocol during bolus administration of intravenous contrast. Multiplanar  CT image reconstructions and MIPs were obtained to evaluate the vascular anatomy. Multidetector CT imaging of the abdomen and pelvis was performed using the standard protocol during bolus administration of intravenous contrast. CONTRAST:  100mL OMNIPAQUE IOHEXOL 350 MG/ML SOLN COMPARISON:  Chest radiograph dated 10/14/2019. FINDINGS: Evaluation is limited due to streak artifact caused by patient's arms. CTA CHEST FINDINGS Cardiovascular: There is no cardiomegaly. Trace pericardial effusion measuring 5 mm anterior to the heart. Minimal atherosclerotic calcification of the thoracic aorta. No aneurysmal dilatation. There is mild dilatation of the main pulmonary trunk suggestive of a degree of pulmonary  hypertension. Evaluation of the pulmonary arteries is limited due to streak artifact caused by patient's arms and suboptimal opacification of the peripheral branches. No pulmonary artery embolus identified. Mediastinum/Nodes: No hilar or mediastinal adenopathy. Calcified granuloma noted. The esophagus is grossly unremarkable. No mediastinal fluid collection. Lungs/Pleura: There is a large right pneumothorax (20% or greater) with shift of the mediastinum into the left hemithorax concerning for a degree of tension. There is background of emphysema. Consolidative changes of the majority of the left lower lobe with air bronchogram may represent atelectasis or infiltrate. Aspiration is not excluded. There are consolidative changes of the right lung base also atelectasis versus infiltrate. Areas of interstitial coarsening involving the anterior subpleural region of the left upper lobe and lingula as well as right middle lobe. A tracheostomy with tip above the carina. There is mucous secretion within the left lower lobe bronchi. There is extension of the pneumothorax into the diaphragmatic hiatus. No pleural effusion. Musculoskeletal: No acute osseous pathology. Review of the MIP images confirms the above findings. CT ABDOMEN and PELVIS FINDINGS No intra-abdominal free air or free fluid. Hepatobiliary: Probable background of fatty infiltration of the liver. Subcentimeter right hepatic hypodense focus is not characterized. No intrahepatic biliary ductal dilatation. Cholecystectomy. Pancreas: Unremarkable. No pancreatic ductal dilatation or surrounding inflammatory changes. Spleen: Small scattered calcified splenic granuloma. Adrenals/Urinary Tract: The right adrenal gland is unremarkable. Minimal nodularity of the left adrenal gland. Subcentimeter bilateral renal hypodense lesions are too small to characterize. There is no hydronephrosis on either side. There is symmetric enhancement and excretion of contrast by both  kidneys. The visualized ureters appear unremarkable. Mild trabeculated appearance of the bladder wall may be related to chronic bladder outlet obstruction. Stomach/Bowel: There is a percutaneous gastrostomy with balloon in the distal stomach. There is no bowel obstruction or active inflammation. The appendix is normal. Vascular/Lymphatic: Mild aortoiliac atherosclerotic disease. The IVC is unremarkable. No portal venous gas. There is no adenopathy. Reproductive: The prostate and seminal vesicles are grossly unremarkable. Other: None Musculoskeletal: Multilevel degenerative changes of the spine. No acute osseous pathology. Review of the MIP images confirms the above findings. IMPRESSION: 1. Large right pneumothorax with shift of the mediastinum into the left hemithorax concerning for a degree of tension. 2. No CT evidence of pulmonary artery embolus. 3. Emphysema. 4. Bilateral lower lobe consolidative changes may represent atelectasis or infiltrate. Aspiration is not excluded. Clinical correlation is recommended. 5. No acute intra-abdominal or pelvic pathology. No bowel obstruction or active inflammation. Normal appendix. 6. Aortic Atherosclerosis (ICD10-I70.0) and Emphysema (ICD10-J43.9). These results were called by telephone at the time of interpretation on 10/17/2019 at 5:30 pm to provider Deakins, who verbally acknowledged these results. Electronically Signed   By: Elgie CollardArash  Radparvar M.D.   On: 10/17/2019 17:34   Ct Abdomen Pelvis W Contrast  Result Date: 10/17/2019 CLINICAL DATA:  68 year old male with concern for pulmonary embolism. COVID-19. EXAM: CT ANGIOGRAPHY  CHEST CT ABDOMEN AND PELVIS WITH CONTRAST TECHNIQUE: Multidetector CT imaging of the chest was performed using the standard protocol during bolus administration of intravenous contrast. Multiplanar CT image reconstructions and MIPs were obtained to evaluate the vascular anatomy. Multidetector CT imaging of the abdomen and pelvis was performed using  the standard protocol during bolus administration of intravenous contrast. CONTRAST:  171mL OMNIPAQUE IOHEXOL 350 MG/ML SOLN COMPARISON:  Chest radiograph dated 10/14/2019. FINDINGS: Evaluation is limited due to streak artifact caused by patient's arms. CTA CHEST FINDINGS Cardiovascular: There is no cardiomegaly. Trace pericardial effusion measuring 5 mm anterior to the heart. Minimal atherosclerotic calcification of the thoracic aorta. No aneurysmal dilatation. There is mild dilatation of the main pulmonary trunk suggestive of a degree of pulmonary hypertension. Evaluation of the pulmonary arteries is limited due to streak artifact caused by patient's arms and suboptimal opacification of the peripheral branches. No pulmonary artery embolus identified. Mediastinum/Nodes: No hilar or mediastinal adenopathy. Calcified granuloma noted. The esophagus is grossly unremarkable. No mediastinal fluid collection. Lungs/Pleura: There is a large right pneumothorax (20% or greater) with shift of the mediastinum into the left hemithorax concerning for a degree of tension. There is background of emphysema. Consolidative changes of the majority of the left lower lobe with air bronchogram may represent atelectasis or infiltrate. Aspiration is not excluded. There are consolidative changes of the right lung base also atelectasis versus infiltrate. Areas of interstitial coarsening involving the anterior subpleural region of the left upper lobe and lingula as well as right middle lobe. A tracheostomy with tip above the carina. There is mucous secretion within the left lower lobe bronchi. There is extension of the pneumothorax into the diaphragmatic hiatus. No pleural effusion. Musculoskeletal: No acute osseous pathology. Review of the MIP images confirms the above findings. CT ABDOMEN and PELVIS FINDINGS No intra-abdominal free air or free fluid. Hepatobiliary: Probable background of fatty infiltration of the liver. Subcentimeter right  hepatic hypodense focus is not characterized. No intrahepatic biliary ductal dilatation. Cholecystectomy. Pancreas: Unremarkable. No pancreatic ductal dilatation or surrounding inflammatory changes. Spleen: Small scattered calcified splenic granuloma. Adrenals/Urinary Tract: The right adrenal gland is unremarkable. Minimal nodularity of the left adrenal gland. Subcentimeter bilateral renal hypodense lesions are too small to characterize. There is no hydronephrosis on either side. There is symmetric enhancement and excretion of contrast by both kidneys. The visualized ureters appear unremarkable. Mild trabeculated appearance of the bladder wall may be related to chronic bladder outlet obstruction. Stomach/Bowel: There is a percutaneous gastrostomy with balloon in the distal stomach. There is no bowel obstruction or active inflammation. The appendix is normal. Vascular/Lymphatic: Mild aortoiliac atherosclerotic disease. The IVC is unremarkable. No portal venous gas. There is no adenopathy. Reproductive: The prostate and seminal vesicles are grossly unremarkable. Other: None Musculoskeletal: Multilevel degenerative changes of the spine. No acute osseous pathology. Review of the MIP images confirms the above findings. IMPRESSION: 1. Large right pneumothorax with shift of the mediastinum into the left hemithorax concerning for a degree of tension. 2. No CT evidence of pulmonary artery embolus. 3. Emphysema. 4. Bilateral lower lobe consolidative changes may represent atelectasis or infiltrate. Aspiration is not excluded. Clinical correlation is recommended. 5. No acute intra-abdominal or pelvic pathology. No bowel obstruction or active inflammation. Normal appendix. 6. Aortic Atherosclerosis (ICD10-I70.0) and Emphysema (ICD10-J43.9). These results were called by telephone at the time of interpretation on 10/17/2019 at 5:30 pm to provider Deakins, who verbally acknowledged these results. Electronically Signed   By: Laren Everts.D.  On: 10/17/2019 17:34   Dg Chest Port 1 View  Result Date: 10/19/2019 CLINICAL DATA:  Reason for exam: respiratory failure EXAM: PORTABLE CHEST 1 VIEW COMPARISON:  10/17/2019 FINDINGS: Tracheostomy tube unchanged. Stable cardiac silhouette. There is mild improvement aeration lung bases. No pulmonary edema. No infiltrate identified. RIGHT chest tube in place. No pneumothorax identified. IMPRESSION: 1. Some improvement aeration to lung bases. 2. No pulmonary edema or pneumonia identified 3. RIGHT chest tube in place without pneumothorax. Electronically Signed   By: Genevive Bi M.D.   On: 10/19/2019 08:29   Dg Chest Portable 1 View  Result Date: 10/17/2019 CLINICAL DATA:  Chest tube placement EXAM: PORTABLE CHEST 1 VIEW COMPARISON:  10/16/2019, 10/17/2019 FINDINGS: Tracheostomy tube is in place. Interim placement of right-sided chest tube with pigtail over the mid chest. Probable small pneumothorax at the right apex and CP angle. Dense airspace disease at the left lung base. Small amount of chest wall emphysema on the right. IMPRESSION: 1. Right-sided chest tube as above. Suspect small right apical and CP angle pneumothorax. 2. Persistent dense airspace disease at the left lung base. Atelectasis or pneumonia at the right base. Electronically Signed   By: Jasmine Pang M.D.   On: 10/17/2019 21:35    Assessment/Plan Active Problems:   Chronic systolic CHF (congestive heart failure) (HCC)   Acute on chronic respiratory failure with hypoxia (HCC)   COVID-19 virus infection   Pneumonia due to COVID-19 virus   Sepsis due to Haemophilus influenzae with acute hypoxic respiratory failure and septic shock (HCC)   Pneumothorax   1. Acute on chronic respiratory failure with hypoxia the plan is to continue with weaning the FiO2 and I have asked for respiratory therapy to assess the potential for starting the patient back on T collar nag trials. 2. COVID-19 virus infection in resolution  phase we will continue with supportive care 3. Chronic systolic heart failure right now is compensated continue with present management 4. Pneumonia due to COVID-19 treated we will continue present management 5. Sepsis Haemophilus influenzae clinically resolved we will continue to follow 6. Acute pneumothorax chest tube in place the follow-up x-ray had shown improvement we will continue to follow radiologically   I have personally seen and evaluated the patient, evaluated laboratory and imaging results, formulated the assessment and plan and placed orders. The Patient requires high complexity decision making for assessment and support.  Case was discussed on Rounds with the Respiratory Therapy Staff  Yevonne Pax, MD [facility] Pulmonary Critical Care Medicine Sleep Medicine

## 2019-10-20 LAB — CARBAPENEM RESISTANCE PANEL
Carba Resistance IMP Gene: NOT DETECTED
Carba Resistance KPC Gene: DETECTED — AB
Carba Resistance NDM Gene: NOT DETECTED
Carba Resistance OXA48 Gene: NOT DETECTED
Carba Resistance VIM Gene: NOT DETECTED

## 2019-10-20 LAB — CULTURE, RESPIRATORY W GRAM STAIN

## 2019-10-20 LAB — POTASSIUM: Potassium: 4.2 mmol/L (ref 3.5–5.1)

## 2019-10-20 NOTE — Progress Notes (Signed)
Pulmonary Critical Care Medicine [facility] [place]   PULMONARY CRITICAL CARE SERVICE  PROGRESS NOTE  Date of Service: 10/20/2019  Sergio Mitchell  ASN:[id number]  DOB: December 16, 1950   DOA: 10/04/2019  Referring Physician: Carron Curie, MD  HPI: Sergio Mitchell is a 68 y.o. male seen for follow up of Acute on Chronic Respiratory Failure.  Patient currently is on pressure support mode has been on 40% FiO2 currently is on 10/7 tolerating it well  Medications: Reviewed on Rounds  Physical Exam:  Vitals: Temperature 97.4 pulse 64 respiratory 25 blood pressure is 105/54 saturations 98%  Ventilator Settings mode of ventilation pressure support FiO2 40% 10/7  . General: Comfortable at this time . Eyes: Grossly normal lids, irises & conjunctiva . ENT: grossly tongue is normal . Neck: no obvious mass . Cardiovascular: S1 S2 normal no gallop . Respiratory: Coarse breath sounds few rhonchi are noted . Abdomen: soft . Skin: no rash seen on limited exam . Musculoskeletal: not rigid . Psychiatric:unable to assess . Neurologic: no seizure no involuntary movements         Lab Data:   Basic Metabolic Panel: Recent Labs  Lab 10/14/19 1007 10/16/19 0547 10/17/19 1857 10/17/19 2012 10/19/19 0346  NA 149* 150* 148* 144 145  K 5.2* 3.5 3.9 3.7 3.4*  CL 102 103 101  --  101  CO2 35* 38* 37*  --  33*  GLUCOSE 239* 178* 132*  --  105*  BUN 52* 49* 49*  --  37*  CREATININE 1.30* 1.06 0.97  --  0.71  CALCIUM 10.0 9.6 9.6  --  8.9    ABG: Recent Labs  Lab 10/14/19 1000 10/14/19 1848 10/14/19 1855 10/16/19 0920 10/17/19 2012  PHART 7.316* 7.433 7.429 7.533* 7.595*  PCO2ART 82.0* 62.6* 63.1* 47.5 41.3  PO2ART 48.0* 101 97.8 62.8* 53.0*  HCO3 40.7* 41.1* 40.9* 39.8* 40.0*  O2SAT 79.6 97.9 97.3 93.2 92.0    Liver Function Tests: No results for input(s): AST, ALT, ALKPHOS, BILITOT, PROT, ALBUMIN in the last 168 hours. No results for input(s): LIPASE, AMYLASE in the last  168 hours. No results for input(s): AMMONIA in the last 168 hours.  CBC: Recent Labs  Lab 10/14/19 1007 10/16/19 0547 10/17/19 1857 10/17/19 2012 10/19/19 0346  WBC 14.3* 19.2* 17.7*  --  13.4*  NEUTROABS  --   --  13.6*  --   --   HGB 11.7* 10.4* 9.8* 9.2* 8.6*  HCT 38.2* 34.7* 32.8* 27.0* 27.9*  MCV 103.0* 103.0* 102.8*  --  99.6  PLT 350 354 313  --  283    Cardiac Enzymes: No results for input(s): CKTOTAL, CKMB, CKMBINDEX, TROPONINI in the last 168 hours.  BNP (last 3 results) Recent Labs    10/14/19 1007  BNP 87.2    ProBNP (last 3 results) No results for input(s): PROBNP in the last 8760 hours.  Radiological Exams: Dg Abdomen Peg Tube Location  Result Date: 10/19/2019 CLINICAL DATA:  Status post percutaneous gastrostomy tube. EXAM: ABDOMEN - 1 VIEW COMPARISON:  March 05, 2009. CT dated October 17, 2019. FINDINGS: Injection of contrast through the percutaneous gastrostomy tube opacifies the small bowel. The tube appears grossly unchanged when compared to recent prior CT. The visualized bowel gas pattern is nonspecific and nonobstructive. IMPRESSION: Injection of contrast through the percutaneous gastrostomy tube opacifies the small bowel. The tube appears grossly unchanged when compared to recent CT. Electronically Signed   By: Katherine Mantle M.D.   On:  10/19/2019 16:39   Dg Chest Port 1 View  Result Date: 10/19/2019 CLINICAL DATA:  Reason for exam: respiratory failure EXAM: PORTABLE CHEST 1 VIEW COMPARISON:  10/17/2019 FINDINGS: Tracheostomy tube unchanged. Stable cardiac silhouette. There is mild improvement aeration lung bases. No pulmonary edema. No infiltrate identified. RIGHT chest tube in place. No pneumothorax identified. IMPRESSION: 1. Some improvement aeration to lung bases. 2. No pulmonary edema or pneumonia identified 3. RIGHT chest tube in place without pneumothorax. Electronically Signed   By: Suzy Bouchard M.D.   On: 10/19/2019 08:29     Assessment/Plan Active Problems:   Chronic systolic CHF (congestive heart failure) (HCC)   Acute on chronic respiratory failure with hypoxia (HCC)   COVID-19 virus infection   Pneumonia due to COVID-19 virus   Sepsis due to Haemophilus influenzae with acute hypoxic respiratory failure and septic shock (HCC)   Pneumothorax   1. Acute on chronic respiratory failure with hypoxia plan is to continue with weaning spoke with respiratory therapy to advance the weaning more aggressively now 2. Chronic systolic heart failure appears to be compensated 3. COVID-19 virus infection treated resolved 4. Pneumonia due to COVID-19 treated 5. Sepsis resolved we will continue with supportive care 6. Pneumothorax resolved with the chest tube   I have personally seen and evaluated the patient, evaluated laboratory and imaging results, formulated the assessment and plan and placed orders. The Patient requires high complexity decision making for assessment and support.  Case was discussed on Rounds with the Respiratory Therapy Staff  Allyne Gee, MD [facility] Pulmonary Critical Care Medicine Sleep Medicine

## 2019-10-21 NOTE — Progress Notes (Signed)
Pulmonary Critical Care Medicine [facility] [place]   PULMONARY CRITICAL CARE SERVICE  PROGRESS NOTE  Date of Service: 10/21/2019  Sergio Mitchell  EPP:[id number]  DOB: [birth year]   DOA: 25-Oct-2019  Referring Physician: Carron Curie, MD  HPI: Sergio Mitchell is a 68 y.o. male seen for follow up of Acute on Chronic Respiratory Failure.  Patient currently is on pressure support the goal today is for 12 hours  Medications: Reviewed on Rounds  Physical Exam:  Vitals: Temperature 93.7 pulse 78 respiratory 21 blood pressure 117/56 saturations 96%  Ventilator Settings mode of ventilation pressure support FiO2 45% per support 10/7  . General: Comfortable at this time . Eyes: Grossly normal lids, irises & conjunctiva . ENT: grossly tongue is normal . Neck: no obvious mass . Cardiovascular: S1 S2 normal no gallop . Respiratory: No rhonchi no rales are noted at this time . Abdomen: soft . Skin: no rash seen on limited exam . Musculoskeletal: not rigid . Psychiatric:unable to assess . Neurologic: no seizure no involuntary movements         Lab Data:   Basic Metabolic Panel: Recent Labs  Lab 10/16/19 0547 10/17/19 1857 10/17/19 2012 10/19/19 0346 10/20/19 1411  NA 150* 148* 144 145  --   K 3.5 3.9 3.7 3.4* 4.2  CL 103 101  --  101  --   CO2 38* 37*  --  33*  --   GLUCOSE 178* 132*  --  105*  --   BUN 49* 49*  --  37*  --   CREATININE 1.06 0.97  --  0.71  --   CALCIUM 9.6 9.6  --  8.9  --     ABG: Recent Labs  Lab 10/14/19 1848 10/14/19 1855 10/16/19 0920 10/17/19 2012  PHART 7.433 7.429 7.533* 7.595*  PCO2ART 62.6* 63.1* 47.5 41.3  PO2ART 101 97.8 62.8* 53.0*  HCO3 41.1* 40.9* 39.8* 40.0*  O2SAT 97.9 97.3 93.2 92.0    Liver Function Tests: No results for input(s): AST, ALT, ALKPHOS, BILITOT, PROT, ALBUMIN in the last 168 hours. No results for input(s): LIPASE, AMYLASE in the last 168 hours. No results for input(s): AMMONIA in the last 168  hours.  CBC: Recent Labs  Lab 10/16/19 0547 10/17/19 1857 10/17/19 2012 10/19/19 0346  WBC 19.2* 17.7*  --  13.4*  NEUTROABS  --  13.6*  --   --   HGB 10.4* 9.8* 9.2* 8.6*  HCT 34.7* 32.8* 27.0* 27.9*  MCV 103.0* 102.8*  --  99.6  PLT 354 313  --  283    Cardiac Enzymes: No results for input(s): CKTOTAL, CKMB, CKMBINDEX, TROPONINI in the last 168 hours.  BNP (last 3 results) Recent Labs    10/14/19 1007  BNP 87.2    ProBNP (last 3 results) No results for input(s): PROBNP in the last 8760 hours.  Radiological Exams: Dg Abdomen Peg Tube Location  Result Date: 10/19/2019 CLINICAL DATA:  Status post percutaneous gastrostomy tube. EXAM: ABDOMEN - 1 VIEW COMPARISON:  March 05, 2009. CT dated October 17, 2019. FINDINGS: Injection of contrast through the percutaneous gastrostomy tube opacifies the small bowel. The tube appears grossly unchanged when compared to recent prior CT. The visualized bowel gas pattern is nonspecific and nonobstructive. IMPRESSION: Injection of contrast through the percutaneous gastrostomy tube opacifies the small bowel. The tube appears grossly unchanged when compared to recent CT. Electronically Signed   By: Katherine Mantle M.D.   On: 10/19/2019 16:39    Assessment/Plan  Active Problems:   Chronic systolic CHF (congestive heart failure) (HCC)   Acute on chronic respiratory failure with hypoxia (HCC)   COVID-19 virus infection   Pneumonia due to COVID-19 virus   Sepsis due to Haemophilus influenzae with acute hypoxic respiratory failure and septic shock (HCC)   Pneumothorax   1. Acute on chronic respiratory failure hypoxia plan is to continue with pressure support weaning as tolerated currently on 10/7 2. Congestive heart failure right now compensated continue supportive care 3. COVID-19 virus infection in resolution phase 4. Pneumonia due to COVID-19 treated we will continue with supportive care 5. Sepsis treated improving 6. Pneumothorax  resolved   I have personally seen and evaluated the patient, evaluated laboratory and imaging results, formulated the assessment and plan and placed orders. The Patient requires high complexity decision making for assessment and support.  Case was discussed on Rounds with the Respiratory Therapy Staff  Allyne Gee, MD [facility] Pulmonary Critical Care Medicine Sleep Medicine

## 2019-10-22 NOTE — Progress Notes (Signed)
Pulmonary Critical Care Medicine [facility]   PULMONARY CRITICAL CARE SERVICE  PROGRESS NOTE  Date of Service: 10/22/2019  Sergio Mitchell  IDP:[id number]  DOB: [birth year]   DOA: 09/26/2019  Referring Physician: [name], MD  HPI: Sergio Mitchell is a 68 y.o. male seen for follow up of Acute on Chronic Respiratory Failure.  Patient remains on pressure support mode 10/7 the goal is for 16 hours on the wean  Medications: Reviewed on Rounds  Physical Exam:  Vitals: Temperature 98.9 pulse 88 respiratory 25 blood pressure 134/76 saturations 93%  Ventilator Settings mode ventilation pressure support FiO2 45% per support 10 PEEP 7  . General: Comfortable at this time . Eyes: Grossly normal lids, irises & conjunctiva . ENT: grossly tongue is normal . Neck: no obvious mass . Cardiovascular: S1 S2 normal no gallop . Respiratory: No rhonchi are noted at this time . Abdomen: soft . Skin: no rash seen on limited exam . Musculoskeletal: not rigid . Psychiatric:unable to assess . Neurologic: no seizure no involuntary movements         Lab Data:   Basic Metabolic Panel: Recent Labs  Lab 10/16/19 0547 10/17/19 1857 10/17/19 2012 10/19/19 0346 10/20/19 1411  NA 150* 148* 144 145  --   K 3.5 3.9 3.7 3.4* 4.2  CL 103 101  --  101  --   CO2 38* 37*  --  33*  --   GLUCOSE 178* 132*  --  105*  --   BUN 49* 49*  --  37*  --   CREATININE 1.06 0.97  --  0.71  --   CALCIUM 9.6 9.6  --  8.9  --     ABG: Recent Labs  Lab 10/16/19 0920 10/17/19 2012  PHART 7.533* 7.595*  PCO2ART 47.5 41.3  PO2ART 62.8* 53.0*  HCO3 39.8* 40.0*  O2SAT 93.2 92.0    Liver Function Tests: No results for input(s): AST, ALT, ALKPHOS, BILITOT, PROT, ALBUMIN in the last 168 hours. No results for input(s): LIPASE, AMYLASE in the last 168 hours. No results for input(s): AMMONIA in the last 168 hours.  CBC: Recent Labs  Lab 10/16/19 0547 10/17/19 1857 10/17/19 2012  10/19/19 0346  WBC 19.2* 17.7*  --  13.4*  NEUTROABS  --  13.6*  --   --   HGB 10.4* 9.8* 9.2* 8.6*  HCT 34.7* 32.8* 27.0* 27.9*  MCV 103.0* 102.8*  --  99.6  PLT 354 313  --  283    Cardiac Enzymes: No results for input(s): CKTOTAL, CKMB, CKMBINDEX, TROPONINI in the last 168 hours.  BNP (last 3 results) Recent Labs    10/14/19 1007  BNP 87.2    ProBNP (last 3 results) No results for input(s): PROBNP in the last 8760 hours.  Radiological Exams: No results found.  Assessment/Plan Active Problems:   Chronic systolic CHF (congestive heart failure) (HCC)   Acute on chronic respiratory failure with hypoxia (HCC)   COVID-19 virus infection   Pneumonia due to COVID-19 virus   Sepsis due to Haemophilus influenzae with acute hypoxic respiratory failure and septic shock (HCC)   Pneumothorax   1. Acute on chronic respiratory failure with hypoxia plan is to continue to wean for 16 hours on pressure support 2. Chronic systolic heart failure compensated we will continue with supportive care 3. COVID-19 virus infection treated 4. Pneumonia due to COVID-19 improving resolving 5. Sepsis resolving 6. Pneumothorax improved   I have personally seen and evaluated the  patient, evaluated laboratory and imaging results, formulated the assessment and plan and placed orders. The Patient requires high complexity decision making for assessment and support.  Case was discussed on Rounds with the Respiratory Therapy Staff  Allyne Gee, MD [facility] Pulmonary Critical Care Medicine Sleep Medicine

## 2019-10-23 LAB — BASIC METABOLIC PANEL
Anion gap: 11 (ref 5–15)
BUN: 23 mg/dL (ref 8–23)
CO2: 27 mmol/L (ref 22–32)
Calcium: 9.1 mg/dL (ref 8.9–10.3)
Chloride: 104 mmol/L (ref 98–111)
Creatinine, Ser: 0.56 mg/dL — ABNORMAL LOW (ref 0.61–1.24)
GFR calc Af Amer: >60 mL/min (ref >60)
GFR calc non Af Amer: >60 mL/min (ref >60)
Glucose, Bld: 99 mg/dL (ref 70–99)
Potassium: 4.2 mmol/L (ref 3.5–5.1)
Sodium: 142 mmol/L (ref 135–145)

## 2019-10-23 LAB — CBC
HCT: 32 % — ABNORMAL LOW (ref 39.0–52.0)
Hemoglobin: 10 g/dL — ABNORMAL LOW (ref 13.0–17.0)
MCH: 30.9 pg (ref 26.0–34.0)
MCHC: 31.3 g/dL (ref 30.0–36.0)
MCV: 98.8 fL (ref 80.0–100.0)
Platelets: 474 10*3/uL — ABNORMAL HIGH (ref 150–400)
RBC: 3.24 MIL/uL — ABNORMAL LOW (ref 4.22–5.81)
RDW: 14.6 % (ref 11.5–15.5)
WBC: 18.1 10*3/uL — ABNORMAL HIGH (ref 4.0–10.5)
nRBC: 0.6 % — ABNORMAL HIGH (ref 0.0–0.2)

## 2019-10-23 NOTE — Progress Notes (Signed)
Pulmonary Critical Care Medicine [place]   PULMONARY CRITICAL CARE SERVICE  PROGRESS NOTE  Date of Service: 10/23/2019  Sergio Mitchell  WCH:[id number]  DOB: June 03, 1951   DOA: 10/17/2019  Referring Physician: [name], MD  HPI: Sergio Mitchell is a 68 y.o. male seen for follow up of Acute on Chronic Respiratory Failure.  Patient is off the ventilator nag device has been goal of 2 hours  Medications: Reviewed on Rounds  Physical Exam:  Vitals: Temperature 97.8 pulse 68 respiratory 25 blood pressure 102/56 saturations 94%  Ventilator Settings off the ventilator on the neck device  . General: Comfortable at this time . Eyes: Grossly normal lids, irises & conjunctiva . ENT: grossly tongue is normal . Neck: no obvious mass . Cardiovascular: S1 S2 normal no gallop . Respiratory: No rhonchi coarse breath sounds are noted . Abdomen: soft . Skin: no rash seen on limited exam . Musculoskeletal: not rigid . Psychiatric:unable to assess . Neurologic: no seizure no involuntary movements         Lab Data:   Basic Metabolic Panel: Recent Labs  Lab 10/17/19 1857 10/17/19 2012 10/19/19 0346 10/20/19 1411 10/23/19 0556  NA 148* 144 145  --  142  K 3.9 3.7 3.4* 4.2 4.2  CL 101  --  101  --  104  CO2 37*  --  33*  --  27  GLUCOSE 132*  --  105*  --  99  BUN 49*  --  37*  --  23  CREATININE 0.97  --  0.71  --  0.56*  CALCIUM 9.6  --  8.9  --  9.1    ABG: Recent Labs  Lab 10/16/19 0920 10/17/19 2012  PHART 7.533* 7.595*  PCO2ART 47.5 41.3  PO2ART 62.8* 53.0*  HCO3 39.8* 40.0*  O2SAT 93.2 92.0    Liver Function Tests: No results for input(s): AST, ALT, ALKPHOS, BILITOT, PROT, ALBUMIN in the last 168 hours. No results for input(s): LIPASE, AMYLASE in the last 168 hours. No results for input(s): AMMONIA in the last 168 hours.  CBC: Recent Labs  Lab 10/17/19 1857 10/17/19 2012 10/19/19 0346 10/23/19 0556  WBC 17.7*  --  13.4* 18.1*   NEUTROABS 13.6*  --   --   --   HGB 9.8* 9.2* 8.6* 10.0*  HCT 32.8* 27.0* 27.9* 32.0*  MCV 102.8*  --  99.6 98.8  PLT 313  --  283 474*    Cardiac Enzymes: No results for input(s): CKTOTAL, CKMB, CKMBINDEX, TROPONINI in the last 168 hours.  BNP (last 3 results) Recent Labs    10/14/19 1007  BNP 87.2    ProBNP (last 3 results) No results for input(s): PROBNP in the last 8760 hours.  Radiological Exams: No results found.  Assessment/Plan Active Problems:   Chronic systolic CHF (congestive heart failure) (HCC)   Acute on chronic respiratory failure with hypoxia (HCC)   COVID-19 virus infection   Pneumonia due to COVID-19 virus   Sepsis due to Haemophilus influenzae with acute hypoxic respiratory failure and septic shock (HCC)   Pneumothorax   1. Acute on chronic respiratory failure hypoxia we will continue to wean as tolerated 2. Chronic systolic heart failure right now appears to be compensated 3. COVID-19 virus infection in resolution phase 4. Pneumonia due to COVID-19 treated we will continue with supportive care 5. Sepsis resolved 6. Pneumothorax resolved continue with supportive care   I have personally seen and evaluated the patient, evaluated laboratory  and imaging results, formulated the assessment and plan and placed orders. The Patient requires high complexity decision making for assessment and support.  Case was discussed on Rounds with the Respiratory Therapy Staff  Allyne Gee, MD [facility] Pulmonary Critical Care Medicine Sleep Medicine

## 2019-10-24 LAB — VANCOMYCIN, TROUGH: Vancomycin Tr: <4 ug/mL — ABNORMAL LOW (ref 15–20)

## 2019-10-24 NOTE — Consult Note (Signed)
Infectious Disease Consultation   Sergio Mitchell  QMG:[id number]  DOB: Nov 29, 1950  DOA: 10/13/2019  Requesting physician: Dr. Laren Everts  Reason for consultation: Antibiotic recommendations   History of Present Illness: Patient with trach, nonverbal.  History obtained from the medical records. Sergio Mitchell is an 68 y.o. male with history of chronic tracheostomy dependent respiratory failure, PEG tube dependent, systolic congestive heart failure, scoliosis, atrial fibrillation who was initially admitted to [place] [facility] on 04/17/2019 with chest pain, shortness of breath.  He was diagnosed with cardiogenic shock with EF 20-25% and recommended for AICD.  Patient also had septic shock from healthcare associated pneumonia with BAL positive Haemophilus influenza A.  He was treated with broad-spectrum antimicrobials.  He required tracheostomy and PEG placement and was discharged to [facility] on 06/07/2019.  While at the outside facility he also had encephalopathy along with anxiety.  On 07/31/2019 he was seen at [facility] was treated and discharged back to [facility].  On 08/30/2019 was positive for COVID-19.  On 09/02/2019 he was transferred from [facility] back to [facility] with persistent hypoxemia in the setting of COVID-19.  He received Solu-Medrol from 09/02/2019 through 09/12/2019.  He also received treatment with remdesivir and convalescent plasma.  While at the outside facility he was noted to have chronic systolic congestive heart failure.  He has history of atrial fibrillation.  Due to his complex medical problems [facility]  He had CT done on 10/17/2019 which showed right-sided pneumothorax.  Patient also had some bleeding from the PEG tube site. He had chest tube placed.  He had fever, worsening leukocytosis.  Respiratory cultures collected on 10/16/2019 which showed Klebsiella pneumonia, KPC.  Review of Systems:   Patient with trach in place, unable to obtain review of systems.   Past Medical History: Past Medical History:  Diagnosis Date  . Acute on chronic respiratory failure with hypoxia ([place])   . Chronic atrial fibrillation ([place])   . Chronic systolic heart failure ([place])   . COVID-19 virus infection   . DDD (degenerative disc disease), lumbar   . Fibromyositis   . Osteoporosis   . Pneumonia due to COVID-19 virus   . Pneumothorax   . S/P percutaneous endoscopic gastrostomy (PEG) tube placement ([place])   . Scoliosis   . Sepsis due to Haemophilus influenzae with acute hypoxic respiratory failure and septic shock ([place])   . Spinal stenosis   . Systolic CHF, chronic ([place]) 04/2019   EF 20-25%, ACID recommended  . Tracheostomy tube present ([place])   . Ventilator dependence [facility])     Past Surgical History: Past Surgical History:  Procedure Laterality Date  . CHOLECYSTECTOMY    . JOINT REPLACEMENT    . REPLACEMENT TOTAL KNEE BILATERAL Bilateral   . TONSILECTOMY, ADENOIDECTOMY, BILATERAL MYRINGOTOMY AND TUBES    . TRACHEOSTOMY       Allergies:   Allergies  Allergen Reactions  . Bacitracin Other (See Comments)    Per MAR  . Cefdinir Other (See Comments)    Per MAR  . Lidocaine Other (See Comments)    Per MAR  . Neomycin Other (See Comments)    Per MAR  . Neosporin Af [Miconazole] Other (See Comments)    Unknown 9.7.2020 Per [facility] MAR pt is currently using Miconazole Topical [place]  . Pantoprazole Other (See Comments)    unknown  . Penicillins Other (See Comments)  Per MAR  . Polymyxin B Other (See Comments)    Per MAR  . Pramoxine Other (See Comments)    Per MAR     Social History: Quit smoking. His smoking use included cigarettes. He has a 78.00 pack-year smoking history. He has never used smokeless tobacco. No alcohol or other drugs.   Family History: Family History  Problem Relation Age of Onset  . Scoliosis Mother   . Fibromyalgia Mother   . Prostate cancer  Father    Physical Exam: Vitals: Temperature 97.5, pulse 77, respiratory rate 25, blood pressure 100/58, oxygen saturation 95%, FiO2 45%, PEEP 7. Constitutional: Ill-appearing male, awake Head: Atraumatic Eyes: PERLA, EOMI, irises appear normal, anicteric sclera,  ENMT: external ears and nose appear normal, normal hearing, poor dentition Neck: Has trach in place CVS: S1-S2, no murmur Respiratory: Rhonchi, no wheezing Abdomen: soft nontender, bowel sounds heard Musculoskeletal: No edema Neuro: He has debility with generalized weakness Psych: stable mood and affect Skin: no rashes  Data reviewed:  I have personally reviewed following labs and imaging studies Labs:  CBC: Recent Labs  Lab 10/17/19 1857 10/17/19 2012 10/19/19 0346 10/23/19 0556  WBC 17.7*  --  13.4* 18.1*  NEUTROABS 13.6*  --   --   --   HGB 9.8* 9.2* 8.6* 10.0*  HCT 32.8* 27.0* 27.9* 32.0*  MCV 102.8*  --  99.6 98.8  PLT 313  --  283 474*    Basic Metabolic Panel: Recent Labs  Lab 10/17/19 1857 10/17/19 2012 10/19/19 0346 10/20/19 1411 10/23/19 0556  NA 148* 144 145  --  142  K 3.9 3.7 3.4* 4.2 4.2  CL 101  --  101  --  104  CO2 37*  --  33*  --  27  GLUCOSE 132*  --  105*  --  99  BUN 49*  --  37*  --  23  CREATININE 0.97  --  0.71  --  0.56*  CALCIUM 9.6  --  8.9  --  9.1   GFR CrCl cannot be calculated (Unknown ideal weight.). Liver Function Tests: No results for input(s): AST, ALT, ALKPHOS, BILITOT, PROT, ALBUMIN in the last 168 hours. No results for input(s): LIPASE, AMYLASE in the last 168 hours. No results for input(s): AMMONIA in the last 168 hours. Coagulation profile Recent Labs  Lab 10/17/19 1857  INR 1.3*    Cardiac Enzymes: No results for input(s): CKTOTAL, CKMB, CKMBINDEX, TROPONINI in the last 168 hours. BNP: Invalid input(s): POCBNP CBG: No results for input(s): GLUCAP in the last 168 hours. D-Dimer No results for input(s): DDIMER in the last 72 hours. Hgb A1c No  results for input(s): HGBA1C in the last 72 hours. Lipid Profile No results for input(s): CHOL, HDL, LDLCALC, TRIG, CHOLHDL, LDLDIRECT in the last 72 hours. Thyroid function studies No results for input(s): TSH, T4TOTAL, T3FREE, THYROIDAB in the last 72 hours.  Invalid input(s): FREET3 Anemia work up No results for input(s): VITAMINB12, FOLATE, FERRITIN, TIBC, IRON, RETICCTPCT in the last 72 hours. Urinalysis    Component Value Date/Time   COLORURINE RED (A) 09/02/2019 0301   APPEARANCEUR TURBID (A) 09/02/2019 0301   LABSPEC  09/02/2019 0301    TEST NOT REPORTED DUE TO COLOR INTERFERENCE OF URINE PIGMENT   PHURINE  09/02/2019 0301    TEST NOT REPORTED DUE TO COLOR INTERFERENCE OF URINE PIGMENT   GLUCOSEU NEGATIVE 09/02/2019 0301   HGBUR NEGATIVE 09/02/2019 0301   BILIRUBINUR NEGATIVE 09/02/2019 0301   KETONESUR  NEGATIVE 09/02/2019 0301   PROTEINUR NEGATIVE 09/02/2019 0301   UROBILINOGEN 0.2 08/11/2010 1041   NITRITE NEGATIVE 09/02/2019 0301   LEUKOCYTESUR NEGATIVE 09/02/2019 0301     Microbiology Recent Results (from the past 240 hour(s))  Carbapenem Resistance Panel     Status: Abnormal   Collection Time: 10/16/19 10:00 AM  Result Value Ref Range Status   Carba Resistance IMP Gene NOT DETECTED NOT DETECTED Final   Carba Resistance VIM Gene NOT DETECTED NOT DETECTED Final   Carba Resistance NDM Gene NOT DETECTED NOT DETECTED Final   Carba Resistance KPC Gene DETECTED (A) NOT DETECTED Final    Comment: RESULT CALLED TO, READ BACK BY AND VERIFIED WITH: HANNAH DEAKINS _0  10/20/19 AKT    Carba Resistance OXA48 Gene NOT DETECTED NOT DETECTED Final    Comment: (NOTE) Cepheid Carba-R is an FDA-cleared nucleic acid amplification test  (NAAT)for the detection and differentiation of genes encoding the  most prevalent carbapenemases in bacterial isolate samples. Carbapenemase gene identification and implementation of comprehensive  infection control measures are recommended by  the CDC to prevent the  spread of the resistant organisms. Performed at [facility], [place] [address] [place], [place] [address]   Culture, respiratory (non-expectorated)     Status: None   Collection Time: 10/16/19  5:40 PM   Specimen: Tracheal Aspirate; Respiratory  Result Value Ref Range Status   Specimen Description TRACHEAL ASPIRATE  Final   Special Requests NONE  Final   Gram Stain   Final    ABUNDANT WBC PRESENT, PREDOMINANTLY PMN MODERATE GRAM POSITIVE RODS RARE GRAM POSITIVE COCCI RARE GRAM NEGATIVE RODS Performed at [facility], [place] [address], [place], [place] [address]    Culture   Final    FEW CONFIRMED CARBAPENEMASE RESISTANT ENTEROBACTERIACAE   Report Status 10/20/2019 FINAL  Final   Organism ID, Bacteria   Final    CONFIRMED CARBAPENEMASE RESISTANT ENTEROBACTERIACAE      Susceptibility   Confirmed carbapenemase resistant enterobacteriacae - MIC*    AMPICILLIN >=32 RESISTANT Resistant     CEFAZOLIN >=64 RESISTANT Resistant     CEFEPIME 4 SENSITIVE Sensitive     CEFTAZIDIME >=64 RESISTANT Resistant     CEFTRIAXONE 16 INTERMEDIATE Intermediate     CIPROFLOXACIN >=4 RESISTANT Resistant     GENTAMICIN 8 INTERMEDIATE Intermediate     IMIPENEM >=16 RESISTANT Resistant     TRIMETH/SULFA >=320 RESISTANT Resistant     AMPICILLIN/SULBACTAM >=32 RESISTANT Resistant     PIP/TAZO >=128 RESISTANT Resistant     Extended ESBL NEGATIVE Sensitive     * FEW CONFIRMED CARBAPENEMASE RESISTANT ENTEROBACTERIACAE       Inpatient Medications:   Scheduled Meds: Please see MAR  Radiological Exams on Admission: No results found.  Impression/Recommendations Active Problems:   Chronic systolic CHF (congestive heart failure) (HCC)   Acute on chronic respiratory failure with hypoxia (HCC)   COVID-19 virus infection   Pneumonia due to COVID-19 virus   Sepsis due to Haemophilus influenzae with acute hypoxic respiratory failure and septic shock (HCC)    Pneumothorax Pneumonia with Carbapenem resistant Enterobacteriaceae Leukocytosis COPD Atrial fibrillation Sacral pressure ulcer  Acute on chronic hypoxemic respiratory failure: Multifactorial.  Initially started with COVID-19 virus infection and pneumonia.  He was already treated for COVID-19 infection with remdesivir, convalescent plasma.  He also had superimposed Haemophilus influenzae which he was already treated with broad-spectrum antimicrobials.  He also has COPD. Most recent respiratory cultures showing Carbapenamase resistant Enterobacteriaceae.  We will  start him on treatment with Avycaz.  We will plan to treat for duration of 1 week pending improvement.  Pulmonary following.  Leukocytosis: Likely secondary to the pneumonia.  Antibiotics as mentioned above.  Continue to monitor counts.  Sepsis: He initially had COVID-19 infection with pneumonia.  Also had sepsis at the acute facility due to Haemophilus influenzae.  Already treated with broad-spectrum antimicrobials.  Currently sepsis has resolved.  However, he is at high risk for recurrent sepsis.  Pneumothorax: He is status post chest tube placement.  Further management per pulmonary and primary team.  COVID-19 infection: Patient already treated with IV remdesivir, convalescent plasma.  Continue supportive management.  Chronic systolic congestive heart failure: Continue medications and management per the primary team.  Appears to be stable right now but he is at high risk for CHF exacerbation.  COPD: Continue to medications and management per primary team.  Atrial fibrillation: Rate controlled.  Continue management per the primary team.  Sacral pressure ulcer: Likely stage II.  Continue local wound care. Due to his complex medical problems he is very high risk for worsening and decompensation.  Thank you for this consultation.      Yaakov Guthrie M.D.  10/24/2019, 3:05 PM

## 2019-10-24 NOTE — Progress Notes (Signed)
Pulmonary Critical Care Medicine [facility] [place]   PULMONARY CRITICAL CARE SERVICE  PROGRESS NOTE  Date of Service: 10/24/2019  Sergio Mitchell  IDP:[id number]  DOB: [birth year]   DOA: 10/16/2019  Referring Physician: Carron Curie, MD  HPI: Sergio Mitchell is a 68 y.o. male seen for follow up of Acute on Chronic Respiratory Failure.  Patient right now is resting on assist control but is supposed to go off the ventilator for 4 hours today  Medications: Reviewed on Rounds  Physical Exam:  Vitals: Temperature 97.5 pulse 77 respiratory 25 blood pressure 100/58 saturations 95%  Ventilator Settings mode ventilation assist control FiO2 45% tidal volume 500 PEEP 7  . General: Comfortable at this time . Eyes: Grossly normal lids, irises & conjunctiva . ENT: grossly tongue is normal . Neck: no obvious mass . Cardiovascular: S1 S2 normal no gallop . Respiratory: No rhonchi no rales are noted at this time . Abdomen: soft . Skin: no rash seen on limited exam . Musculoskeletal: not rigid . Psychiatric:unable to assess . Neurologic: no seizure no involuntary movements         Lab Data:   Basic Metabolic Panel: Recent Labs  Lab 10/17/19 1857 10/17/19 2012 10/19/19 0346 10/20/19 1411 10/23/19 0556  NA 148* 144 145  --  142  K 3.9 3.7 3.4* 4.2 4.2  CL 101  --  101  --  104  CO2 37*  --  33*  --  27  GLUCOSE 132*  --  105*  --  99  BUN 49*  --  37*  --  23  CREATININE 0.97  --  0.71  --  0.56*  CALCIUM 9.6  --  8.9  --  9.1    ABG: Recent Labs  Lab 10/17/19 2012  PHART 7.595*  PCO2ART 41.3  PO2ART 53.0*  HCO3 40.0*  O2SAT 92.0    Liver Function Tests: No results for input(s): AST, ALT, ALKPHOS, BILITOT, PROT, ALBUMIN in the last 168 hours. No results for input(s): LIPASE, AMYLASE in the last 168 hours. No results for input(s): AMMONIA in the last 168 hours.  CBC: Recent Labs  Lab 10/17/19 1857 10/17/19 2012 10/19/19 0346 10/23/19 0556  WBC 17.7*   --  13.4* 18.1*  NEUTROABS 13.6*  --   --   --   HGB 9.8* 9.2* 8.6* 10.0*  HCT 32.8* 27.0* 27.9* 32.0*  MCV 102.8*  --  99.6 98.8  PLT 313  --  283 474*    Cardiac Enzymes: No results for input(s): CKTOTAL, CKMB, CKMBINDEX, TROPONINI in the last 168 hours.  BNP (last 3 results) Recent Labs    10/14/19 1007  BNP 87.2    ProBNP (last 3 results) No results for input(s): PROBNP in the last 8760 hours.  Radiological Exams: No results found.  Assessment/Plan Active Problems:   Chronic systolic CHF (congestive heart failure) (HCC)   Acute on chronic respiratory failure with hypoxia (HCC)   COVID-19 virus infection   Pneumonia due to COVID-19 virus   Sepsis due to Haemophilus influenzae with acute hypoxic respiratory failure and septic shock (HCC)   Pneumothorax   1. Acute on chronic respiratory failure hypoxia plan is to continue to advance the wean today the goal is for 4 hours off the vent patient was able to complete 2 hours yesterday 2. Chronic systolic heart failure compensated continue with supportive care 3. COVID-19 virus infection in resolution phase 4. Pneumonia due to COVID-19 treated we will continue with  supportive care 5. Sepsis resolved 6. Pneumothorax resolved   I have personally seen and evaluated the patient, evaluated laboratory and imaging results, formulated the assessment and plan and placed orders. The Patient requires high complexity decision making for assessment and support.  Case was discussed on Rounds with the Respiratory Therapy Staff  Allyne Gee, MD [facility] Pulmonary Critical Care Medicine Sleep Medicine

## 2019-10-24 DEATH — deceased

## 2019-10-25 NOTE — Progress Notes (Signed)
Pulmonary Critical Care Medicine [place]   PULMONARY CRITICAL CARE SERVICE  PROGRESS NOTE  Date of Service: 10/25/2019  DAILY CRATE  RKY:[id number]  DOB: 02-18-1951   DOA: 09/27/2019  Referring Physician: [name], MD  HPI: Sergio Mitchell is a 68 y.o. male seen for follow up of Acute on Chronic Respiratory Failure.  Patient is on full support this morning supposed to do spontaneous breathing trial later on.  Was able to complete 7 hours off the ventilator yesterday apparently received some morphine and Klonopin  Medications: Reviewed on Rounds  Physical Exam:  Vitals: Temperature 97.7 pulse 78 respiratory 20 blood pressure 142/78 saturations 100%  Ventilator Settings mode ventilation assist control FiO2 50% tidal volume 521 PEEP 7  . General: Comfortable at this time . Eyes: Grossly normal lids, irises & conjunctiva . ENT: grossly tongue is normal . Neck: no obvious mass . Cardiovascular: S1 S2 normal no gallop . Respiratory: No rhonchi no rales are noted at this time . Abdomen: soft . Skin: no rash seen on limited exam . Musculoskeletal: not rigid . Psychiatric:unable to assess . Neurologic: no seizure no involuntary movements         Lab Data:   Basic Metabolic Panel: Recent Labs  Lab 10/19/19 0346 10/20/19 1411 10/23/19 0556  NA 145  --  142  K 3.4* 4.2 4.2  CL 101  --  104  CO2 33*  --  27  GLUCOSE 105*  --  99  BUN 37*  --  23  CREATININE 0.71  --  0.56*  CALCIUM 8.9  --  9.1    ABG: No results for input(s): PHART, PCO2ART, PO2ART, HCO3, O2SAT in the last 168 hours.  Liver Function Tests: No results for input(s): AST, ALT, ALKPHOS, BILITOT, PROT, ALBUMIN in the last 168 hours. No results for input(s): LIPASE, AMYLASE in the last 168 hours. No results for input(s): AMMONIA in the last 168 hours.  CBC: Recent Labs  Lab 10/19/19 0346 10/23/19 0556  WBC 13.4* 18.1*  HGB 8.6* 10.0*  HCT 27.9* 32.0*  MCV 99.6 98.8  PLT  283 474*    Cardiac Enzymes: No results for input(s): CKTOTAL, CKMB, CKMBINDEX, TROPONINI in the last 168 hours.  BNP (last 3 results) Recent Labs    10/14/19 1007  BNP 87.2    ProBNP (last 3 results) No results for input(s): PROBNP in the last 8760 hours.  Radiological Exams: No results found.  Assessment/Plan Active Problems:   Chronic systolic CHF (congestive heart failure) (HCC)   Acute on chronic respiratory failure with hypoxia (HCC)   COVID-19 virus infection   Pneumonia due to COVID-19 virus   Sepsis due to Haemophilus influenzae with acute hypoxic respiratory failure and septic shock (HCC)   Pneumothorax   1. Acute on chronic respiratory failure with hypoxia plan is to continue with full support on assist control mode currently on 50% FiO2 for now and then will resume his spontaneous weaning trials once more awake this morning 2. Acute on chronic systolic heart failure compensated last ejection fraction was 20 to 25% 3. COVID-19 virus infection in resolution phase 4. Pneumonia due to COVID-19 treated we will continue with supportive care 5. Sepsis resolved 6. Pneumothorax resolved   I have personally seen and evaluated the patient, evaluated laboratory and imaging results, formulated the assessment and plan and placed orders. The Patient requires high complexity decision making for assessment and support.  Case was discussed on Rounds with the Respiratory  Therapy Staff  Allyne Gee, MD [facility] Pulmonary Critical Care Medicine Sleep Medicine

## 2019-10-26 ENCOUNTER — Other Ambulatory Visit (HOSPITAL_COMMUNITY): Payer: Medicare Other

## 2019-10-26 LAB — BLOOD GAS, ARTERIAL
Acid-Base Excess: 6.6 mmol/L — ABNORMAL HIGH (ref 0.0–2.0)
Bicarbonate: 31.1 mmol/L — ABNORMAL HIGH (ref 20.0–28.0)
FIO2: 60
O2 Saturation: 85.8 %
Patient temperature: 37
pCO2 arterial: 49.5 mmHg — ABNORMAL HIGH (ref 32.0–48.0)
pH, Arterial: 7.415 (ref 7.350–7.450)
pO2, Arterial: 52.7 mmHg — ABNORMAL LOW (ref 83.0–108.0)

## 2019-10-26 NOTE — Progress Notes (Signed)
Pulmonary Critical Care Medicine [place]   PULMONARY CRITICAL CARE SERVICE  PROGRESS NOTE  Date of Service: 10/26/2019  Sergio Mitchell  WRU:[id number]  DOB: October 22, 1951   DOA: 09/25/2019  Referring Physician: [name], MD  HPI: Sergio Mitchell is a 68 y.o. male seen for follow up of Acute on Chronic Respiratory Failure.  Overnight patient had increased oxygen requirements now is on 60% FiO2.  Last time this happened patient ended up with a pneumothorax.  No recent chest x-ray was done so I went ahead ordered it  Medications: Reviewed on Rounds  Physical Exam:  Vitals: Temperature 98.1 pulse 76 respiratory 16 blood pressure 98/54 saturations 97%  Ventilator Settings mode ventilation assist control FiO2 60% tidal volume 599 PEEP 7  . General: Comfortable at this time . Eyes: Grossly normal lids, irises & conjunctiva . ENT: grossly tongue is normal . Neck: no obvious mass . Cardiovascular: S1 S2 normal no gallop . Respiratory: No rhonchi coarse breath sounds are noted at this time . Abdomen: soft . Skin: no rash seen on limited exam . Musculoskeletal: not rigid . Psychiatric:unable to assess . Neurologic: no seizure no involuntary movements         Lab Data:   Basic Metabolic Panel: Recent Labs  Lab 10/20/19 1411 10/23/19 0556  NA  --  142  K 4.2 4.2  CL  --  104  CO2  --  27  GLUCOSE  --  99  BUN  --  23  CREATININE  --  0.56*  CALCIUM  --  9.1    ABG: No results for input(s): PHART, PCO2ART, PO2ART, HCO3, O2SAT in the last 168 hours.  Liver Function Tests: No results for input(s): AST, ALT, ALKPHOS, BILITOT, PROT, ALBUMIN in the last 168 hours. No results for input(s): LIPASE, AMYLASE in the last 168 hours. No results for input(s): AMMONIA in the last 168 hours.  CBC: Recent Labs  Lab 10/23/19 0556  WBC 18.1*  HGB 10.0*  HCT 32.0*  MCV 98.8  PLT 474*    Cardiac Enzymes: No results for input(s): CKTOTAL, CKMB, CKMBINDEX,  TROPONINI in the last 168 hours.  BNP (last 3 results) Recent Labs    10/14/19 1007  BNP 87.2    ProBNP (last 3 results) No results for input(s): PROBNP in the last 8760 hours.  Radiological Exams: No results found.  Assessment/Plan Active Problems:   Chronic systolic CHF (congestive heart failure) (HCC)   Acute on chronic respiratory failure with hypoxia (HCC)   COVID-19 virus infection   Pneumonia due to COVID-19 virus   Sepsis due to Haemophilus influenzae with acute hypoxic respiratory failure and septic shock (HCC)   Pneumothorax   1. Acute on chronic respiratory failure hypoxia plan is to continue with full support on assist control mode currently on 60% FiO2 with a PEEP of 7 2. Chronic systolic heart failure follow-up chest x-ray ordered 3. COVID-19 virus infection resolution phase 4. Pneumonia due to COVID-19 follow-up chest x-ray ordered 5. Sepsis is resolved 6. Pneumothorax we will do a follow-up chest x-ray to check status   I have personally seen and evaluated the patient, evaluated laboratory and imaging results, formulated the assessment and plan and placed orders. The Patient requires high complexity decision making for assessment and support.  Case was discussed on Rounds with the Respiratory Therapy Staff  Allyne Gee, MD [facility] Pulmonary Critical Care Medicine Sleep Medicine

## 2019-10-27 ENCOUNTER — Other Ambulatory Visit (HOSPITAL_COMMUNITY): Payer: Medicare Other

## 2019-10-27 LAB — CBC
HCT: 34.2 % — ABNORMAL LOW (ref 39.0–52.0)
Hemoglobin: 10.8 g/dL — ABNORMAL LOW (ref 13.0–17.0)
MCH: 31.1 pg (ref 26.0–34.0)
MCHC: 31.6 g/dL (ref 30.0–36.0)
MCV: 98.6 fL (ref 80.0–100.0)
Platelets: 676 10*3/uL — ABNORMAL HIGH (ref 150–400)
RBC: 3.47 MIL/uL — ABNORMAL LOW (ref 4.22–5.81)
RDW: 15.1 % (ref 11.5–15.5)
WBC: 24.3 10*3/uL — ABNORMAL HIGH (ref 4.0–10.5)
nRBC: 0 % (ref 0.0–0.2)

## 2019-10-27 LAB — BASIC METABOLIC PANEL
Anion gap: 11 (ref 5–15)
BUN: 21 mg/dL (ref 8–23)
CO2: 29 mmol/L (ref 22–32)
Calcium: 9.7 mg/dL (ref 8.9–10.3)
Chloride: 101 mmol/L (ref 98–111)
Creatinine, Ser: 0.57 mg/dL — ABNORMAL LOW (ref 0.61–1.24)
GFR calc Af Amer: >60 mL/min (ref >60)
GFR calc non Af Amer: >60 mL/min (ref >60)
Glucose, Bld: 150 mg/dL — ABNORMAL HIGH (ref 70–99)
Potassium: 4.3 mmol/L (ref 3.5–5.1)
Sodium: 141 mmol/L (ref 135–145)

## 2019-10-27 NOTE — Progress Notes (Signed)
Pulmonary Critical Care Medicine [facility] [place]   PULMONARY CRITICAL CARE SERVICE  PROGRESS NOTE  Date of Service: 10/27/2019  Sergio Mitchell  Sergio Mitchell:[id number]  DOB: 03-29-1951   DOA: 10/11/2019  Referring Physician: Carron Curie, MD  HPI: AIREN Mitchell is a 68 y.o. male seen for follow up of Acute on Chronic Respiratory Failure.  Oxygen requirements continue to be high looked at his chest x-ray showed some changes of atelectasis on the left side and may be some volume loss he also on the right side.  The chest tube drainage catheter as moved over to more peripheral along the rib cage now.  As we recall his pneumothorax was more anterior.  Spoke with the team and I would recommend getting another CT to reassess the pneumothorax  Medications: Reviewed on Rounds  Physical Exam:  Vitals: Temperature 97.6 pulse 83 respiratory rate 22 blood pressure 124/67 saturations 98%  Ventilator Settings mode ventilation assist control FiO2 is 90% tidal volume 535 PEEP 7  . General: Comfortable at this time . Eyes: Grossly normal lids, irises & conjunctiva . ENT: grossly tongue is normal . Neck: no obvious mass . Cardiovascular: S1 S2 normal no gallop . Respiratory: Coarse rhonchi diminished breath sounds at the bases . Abdomen: soft . Skin: no rash seen on limited exam . Musculoskeletal: not rigid . Psychiatric:unable to assess . Neurologic: no seizure no involuntary movements         Lab Data:   Basic Metabolic Panel: Recent Labs  Lab 10/20/19 1411 10/23/19 0556 10/27/19 0656  NA  --  142 141  K 4.2 4.2 4.3  CL  --  104 101  CO2  --  27 29  GLUCOSE  --  99 150*  BUN  --  23 21  CREATININE  --  0.56* 0.57*  CALCIUM  --  9.1 9.7    ABG: Recent Labs  Lab 10/26/19 0820  PHART 7.415  PCO2ART 49.5*  PO2ART 52.7*  HCO3 31.1*  O2SAT 85.8    Liver Function Tests: No results for input(s): AST, ALT, ALKPHOS, BILITOT, PROT, ALBUMIN in the last 168 hours. No  results for input(s): LIPASE, AMYLASE in the last 168 hours. No results for input(s): AMMONIA in the last 168 hours.  CBC: Recent Labs  Lab 10/23/19 0556 10/27/19 0656  WBC 18.1* 24.3*  HGB 10.0* 10.8*  HCT 32.0* 34.2*  MCV 98.8 98.6  PLT 474* 676*    Cardiac Enzymes: No results for input(s): CKTOTAL, CKMB, CKMBINDEX, TROPONINI in the last 168 hours.  BNP (last 3 results) Recent Labs    10/14/19 1007  BNP 87.2    ProBNP (last 3 results) No results for input(s): PROBNP in the last 8760 hours.  Radiological Exams: Dg Chest Port 1 View  Result Date: 10/26/2019 CLINICAL DATA:  Follow-up pneumothorax. EXAM: PORTABLE CHEST 1 VIEW COMPARISON:  Chest x-ray 10/19/2019, 10/17/2019.  CT 10/17/2019 FINDINGS: Right chest tube noted over the right mid chest. Very tiny right base residual pneumothorax cannot be excluded on today's exam. Similar finding noted on prior study of 10/17/2019. Mediastinum and hilar structures are stable. Persistent prominent bibasilar atelectasis/infiltrates, left side greater right. No interim change. Small left pleural effusion. Stable cardiomegaly. IMPRESSION: 1. Right chest tube noted over the right mid chest. Very tiny right base residual pneumothorax cannot be excluded on today's exam. Similar finding noted on prior study of 10/17/2019. 2. Persistent prominent bibasilar atelectasis/infiltrates. Left side greater than right. No interim change. Small left pleural  effusion again noted. These results will be called to the ordering clinician or representative by the Radiologist Assistant, and communication documented in the PACS or zVision Dashboard. Electronically Signed   By: Marcello Moores  Register   On: 10/26/2019 09:32    Assessment/Plan Active Problems:   Chronic systolic CHF (congestive heart failure) (HCC)   Acute on chronic respiratory failure with hypoxia ([place])   COVID-19 virus infection   Pneumonia due to COVID-19 virus   Sepsis due to Haemophilus influenzae  with acute hypoxic respiratory failure and septic shock (HCC)   Pneumothorax   1. Acute on chronic respiratory failure with hypoxia patient has had migration of the catheter increased oxygen requirements since that would recommend doing a follow-up CT to make certain that the pneumothorax has not reaccumulated 2. Chronic systolic heart failure at baseline we will continue with supportive care 3. COVID-19 virus infection treated in resolution phase 4. Pneumonia due to COVID-19 treated 5. Sepsis we will continue with supportive care prognosis guarded   I have personally seen and evaluated the patient, evaluated laboratory and imaging results, formulated the assessment and plan and placed orders. The Patient requires high complexity decision making for assessment and support.  Case was discussed on Rounds with the Respiratory Therapy Staff  Allyne Gee, MD [facility] Pulmonary Critical Care Medicine Sleep Medicine

## 2019-10-28 LAB — LACTIC ACID, PLASMA
Lactic Acid, Venous: 2.6 mmol/L (ref 0.5–1.9)
Lactic Acid, Venous: 2.6 mmol/L (ref 0.5–1.9)

## 2019-10-28 LAB — CBC
HCT: 33.7 % — ABNORMAL LOW (ref 39.0–52.0)
Hemoglobin: 10.6 g/dL — ABNORMAL LOW (ref 13.0–17.0)
MCH: 30.8 pg (ref 26.0–34.0)
MCHC: 31.5 g/dL (ref 30.0–36.0)
MCV: 98 fL (ref 80.0–100.0)
Platelets: 655 10*3/uL — ABNORMAL HIGH (ref 150–400)
RBC: 3.44 MIL/uL — ABNORMAL LOW (ref 4.22–5.81)
RDW: 15.3 % (ref 11.5–15.5)
WBC: 30.8 10*3/uL — ABNORMAL HIGH (ref 4.0–10.5)
nRBC: 0 % (ref 0.0–0.2)

## 2019-10-28 LAB — BASIC METABOLIC PANEL
Anion gap: 10 (ref 5–15)
BUN: 24 mg/dL — ABNORMAL HIGH (ref 8–23)
CO2: 28 mmol/L (ref 22–32)
Calcium: 9.8 mg/dL (ref 8.9–10.3)
Chloride: 103 mmol/L (ref 98–111)
Creatinine, Ser: 0.47 mg/dL — ABNORMAL LOW (ref 0.61–1.24)
GFR calc Af Amer: >60 mL/min (ref >60)
GFR calc non Af Amer: >60 mL/min (ref >60)
Glucose, Bld: 141 mg/dL — ABNORMAL HIGH (ref 70–99)
Potassium: 4.2 mmol/L (ref 3.5–5.1)
Sodium: 141 mmol/L (ref 135–145)

## 2019-10-28 NOTE — Progress Notes (Signed)
Pulmonary Critical Care Medicine [facility] [place]   PULMONARY CRITICAL CARE SERVICE  PROGRESS NOTE  Date of Service: 10/28/2019  Sergio Mitchell  AUQ:[id number]  DOB: September 29, 1951   DOA: 10/20/2019  Referring Physician: Carron Curie, MD  HPI: Sergio Mitchell is a 68 y.o. male seen for follow up of Acute on Chronic Respiratory Failure. Patient was supposed to go down for CT scan of the chest however patient had some desaturations and the rapid response patient had to be on 100% FiO2 So we will reassess again today and see if we can get the CT scan my concern is still that the patient may have a pneumothorax which is not being visualized.  Medications: Reviewed on Rounds  Physical Exam:  Vitals: Temperature 97.8 pulse 91 respiratory 29 blood pressure 127/71 saturations 97%  Ventilator Settings mode ventilation assist control FiO2 is 80% tidal volume 579 PEEP 7  . General: Comfortable at this time . Eyes: Grossly normal lids, irises & conjunctiva . ENT: grossly tongue is normal . Neck: no obvious mass . Cardiovascular: S1 S2 normal no gallop . Respiratory: No rhonchi coarse breath sounds are noted . Abdomen: soft . Skin: no rash seen on limited exam . Musculoskeletal: not rigid . Psychiatric:unable to assess . Neurologic: no seizure no involuntary movements         Lab Data:   Basic Metabolic Panel: Recent Labs  Lab 10/23/19 0556 10/27/19 0656 10/28/19 1028  NA 142 141 141  K 4.2 4.3 4.2  CL 104 101 103  CO2 27 29 28   GLUCOSE 99 150* 141*  BUN 23 21 24*  CREATININE 0.56* 0.57* 0.47*  CALCIUM 9.1 9.7 9.8    ABG: Recent Labs  Lab 10/26/19 0820  PHART 7.415  PCO2ART 49.5*  PO2ART 52.7*  HCO3 31.1*  O2SAT 85.8    Liver Function Tests: No results for input(s): AST, ALT, ALKPHOS, BILITOT, PROT, ALBUMIN in the last 168 hours. No results for input(s): LIPASE, AMYLASE in the last 168 hours. No results for input(s): AMMONIA in the last 168  hours.  CBC: Recent Labs  Lab 10/23/19 0556 10/27/19 0656 10/28/19 1028  WBC 18.1* 24.3* 30.8*  HGB 10.0* 10.8* 10.6*  HCT 32.0* 34.2* 33.7*  MCV 98.8 98.6 98.0  PLT 474* 676* 655*    Cardiac Enzymes: No results for input(s): CKTOTAL, CKMB, CKMBINDEX, TROPONINI in the last 168 hours.  BNP (last 3 results) Recent Labs    10/14/19 1007  BNP 87.2    ProBNP (last 3 results) No results for input(s): PROBNP in the last 8760 hours.  Radiological Exams: Dg Chest Port 1 View  Result Date: 10/27/2019 CLINICAL DATA:  Decreased oxygen saturation. Concern for pneumothorax. EXAM: PORTABLE CHEST 1 VIEW COMPARISON:  10/26/2019. FINDINGS: Patient rotated left. Tracheostomy unchanged in position. Minimal motion degradation. Moderate cardiomegaly. A small to moderate left pleural effusion. Right-sided pleural drain in place. No apical pneumothorax. Equivocal lucency along the right hemidiaphragm for which small volume inferior pleural air cannot be excluded. Improved right base atelectasis. Persistent left base airspace disease. Developing mild interstitial edema. IMPRESSION: Right-sided small bore chest tube/pleural drain remaining in place. No apical pneumothorax identified. Cannot exclude trace inferolateral pleural air, as before. Moderate left pleural effusion with adjacent airspace disease, similar. Cardiomegaly with developing mild interstitial edema. Electronically Signed   By: 14/01/2019 M.D.   On: 10/27/2019 15:08    Assessment/Plan Active Problems:   Chronic systolic CHF (congestive heart failure) (HCC)   Acute on  chronic respiratory failure with hypoxia ([place])   COVID-19 virus infection   Pneumonia due to COVID-19 virus   Sepsis due to Haemophilus influenzae with acute hypoxic respiratory failure and septic shock (HCC)   Pneumothorax   1. Acute on chronic respiratory failure hypoxia patient continues to have high oxygen requirements right now is down to 80% but still  significantly elevated chest x-ray results show still possibility of inferolateral pleural airspace disease and patient still had a moderate degree of left pleural effusion noted. 2. Pneumothorax status post catheter drainage however the catheter has migrated as noted previously. 3. Sepsis treated clinically is improved 4. COVID-19 virus infection recovery phase 5. Chronic systolic heart failure need to monitor fluid status has developing interstitial edema noted diurese as tolerated. 6. Pneumonia due to COVID-19 has been treated we will continue to follow 7. Chronic systolic heart failure as above developing pulmonary edema need more aggressive diuresis   I have personally seen and evaluated the patient, evaluated laboratory and imaging results, formulated the assessment and plan and placed orders. The Patient requires high complexity decision making for assessment and support.  Case was discussed on Rounds with the Respiratory Therapy Staff  Allyne Gee, MD [facility] Pulmonary Critical Care Medicine Sleep Medicine

## 2019-10-29 ENCOUNTER — Other Ambulatory Visit (HOSPITAL_COMMUNITY): Payer: Medicare Other

## 2019-10-29 LAB — VANCOMYCIN, TROUGH: Vancomycin Tr: 12 ug/mL — ABNORMAL LOW (ref 15–20)

## 2019-10-29 NOTE — Progress Notes (Signed)
Pulmonary Critical Care Medicine [place]   PULMONARY CRITICAL CARE SERVICE  PROGRESS NOTE  Date of Service: 10/29/2019  Sergio Mitchell  OZD:[id number]  DOB: [birth year]   DOA: 10/20/2019  Referring Physician: [name], MD  HPI: Sergio Mitchell is a 68 y.o. male seen for follow up of Acute on Chronic Respiratory Failure.  Patient currently is on full support on assist control mode oxygen requirements are at 85% he is awake states that he wants to go home explained to him that on the ventilator he cannot go at this time  Medications: Reviewed on Rounds  Physical Exam:  Vitals: Temperature 96.9 pulse 77 respiratory 16 blood pressure 125/70 saturations 97%  Ventilator Settings mode ventilation assist control FiO2 85% tidal volume 500 PEEP 7  . General: Comfortable at this time . Eyes: Grossly normal lids, irises & conjunctiva . ENT: grossly tongue is normal . Neck: no obvious mass . Cardiovascular: S1 S2 normal no gallop . Respiratory: No rhonchi coarse breath sounds are noted at this time . Abdomen: soft . Skin: no rash seen on limited exam . Musculoskeletal: not rigid . Psychiatric:unable to assess . Neurologic: no seizure no involuntary movements         Lab Data:   Basic Metabolic Panel: Recent Labs  Lab 10/23/19 0556 10/27/19 0656 10/28/19 1028  NA 142 141 141  K 4.2 4.3 4.2  CL 104 101 103  CO2 27 29 28   GLUCOSE 99 150* 141*  BUN 23 21 24*  CREATININE 0.56* 0.57* 0.47*  CALCIUM 9.1 9.7 9.8    ABG: Recent Labs  Lab 10/26/19 0820  PHART 7.415  PCO2ART 49.5*  PO2ART 52.7*  HCO3 31.1*  O2SAT 85.8    Liver Function Tests: No results for input(s): AST, ALT, ALKPHOS, BILITOT, PROT, ALBUMIN in the last 168 hours. No results for input(s): LIPASE, AMYLASE in the last 168 hours. No results for input(s): AMMONIA in the last 168 hours.  CBC: Recent Labs  Lab 10/23/19 0556 10/27/19 0656 10/28/19 1028  WBC 18.1* 24.3* 30.8*  HGB  10.0* 10.8* 10.6*  HCT 32.0* 34.2* 33.7*  MCV 98.8 98.6 98.0  PLT 474* 676* 655*    Cardiac Enzymes: No results for input(s): CKTOTAL, CKMB, CKMBINDEX, TROPONINI in the last 168 hours.  BNP (last 3 results) Recent Labs    10/14/19 1007  BNP 87.2    ProBNP (last 3 results) No results for input(s): PROBNP in the last 8760 hours.  Radiological Exams: Dg Chest Port 1 View  Result Date: 10/29/2019 CLINICAL DATA:  Follow-up pneumonia. Acute on chronic respiratory failure with hypoxia. COPD. EXAM: PORTABLE CHEST 1 VIEW COMPARISON:  10/27/2019 FINDINGS: Tracheostomy tube and right pleural pigtail catheter remain in place. No pneumothorax visualized. There is persistent consolidation or atelectasis in the left retrocardiac lung base, and left pleural effusion cannot be excluded. Mild atelectasis in right lung base is also unchanged. IMPRESSION: 1. No pneumothorax visualized. 2. No significant change in left retrocardiac consolidation or atelectasis and possible pleural effusion. 3. Stable mild right basilar atelectasis. Electronically Signed   By: Marlaine Hind M.D.   On: 10/29/2019 08:19    Assessment/Plan Active Problems:   Chronic systolic CHF (congestive heart failure) (HCC)   Acute on chronic respiratory failure with hypoxia (HCC)   COVID-19 virus infection   Pneumonia due to COVID-19 virus   Sepsis due to Haemophilus influenzae with acute hypoxic respiratory failure and septic shock (HCC)   Pneumothorax   1. Acute  on chronic respiratory failure hypoxia plan is to continue with full support on assist control patient is on 85% FiO2 need to titrate that down also still waiting for CT scan had a chest x-ray done which shows no pneumothorax and no significant change in the pleural effusion 2. Chronic systolic heart failure diurese as tolerated 3. Pneumonia being followed by infectious disease 4. Sepsis this is been treated hemodynamics are stable 5. Pneumothorax not visualized by chest  x-ray   I have personally seen and evaluated the patient, evaluated laboratory and imaging results, formulated the assessment and plan and placed orders. The Patient requires high complexity decision making for assessment and support.  Case was discussed on Rounds with the Respiratory Therapy Staff  Yevonne Pax, MD [facility] Pulmonary Critical Care Medicine Sleep Medicine

## 2019-10-30 LAB — BASIC METABOLIC PANEL
Anion gap: 8 (ref 5–15)
BUN: 31 mg/dL — ABNORMAL HIGH (ref 8–23)
CO2: 31 mmol/L (ref 22–32)
Calcium: 9.3 mg/dL (ref 8.9–10.3)
Chloride: 108 mmol/L (ref 98–111)
Creatinine, Ser: 0.54 mg/dL — ABNORMAL LOW (ref 0.61–1.24)
GFR calc Af Amer: >60 mL/min (ref >60)
GFR calc non Af Amer: >60 mL/min (ref >60)
Glucose, Bld: 153 mg/dL — ABNORMAL HIGH (ref 70–99)
Potassium: 4.5 mmol/L (ref 3.5–5.1)
Sodium: 147 mmol/L — ABNORMAL HIGH (ref 135–145)

## 2019-10-30 LAB — CBC
HCT: 33.2 % — ABNORMAL LOW (ref 39.0–52.0)
Hemoglobin: 10 g/dL — ABNORMAL LOW (ref 13.0–17.0)
MCH: 30.4 pg (ref 26.0–34.0)
MCHC: 30.1 g/dL (ref 30.0–36.0)
MCV: 100.9 fL — ABNORMAL HIGH (ref 80.0–100.0)
Platelets: 556 10*3/uL — ABNORMAL HIGH (ref 150–400)
RBC: 3.29 MIL/uL — ABNORMAL LOW (ref 4.22–5.81)
RDW: 15.4 % (ref 11.5–15.5)
WBC: 17.1 10*3/uL — ABNORMAL HIGH (ref 4.0–10.5)
nRBC: 0 % (ref 0.0–0.2)

## 2019-10-30 LAB — MAGNESIUM: Magnesium: 2 mg/dL (ref 1.7–2.4)

## 2019-10-30 NOTE — Progress Notes (Signed)
Pulmonary Critical Care Medicine [place]   PULMONARY CRITICAL CARE SERVICE  PROGRESS NOTE  Date of Service: 10/30/2019  Sergio Mitchell  QIO:[id number]  DOB: Sep 07, 1951   DOA: 10/04/2019  Referring Physician: [name], MD  HPI: Sergio Mitchell is a 68 y.o. male seen for follow up of Acute on Chronic Respiratory Failure.  Patient currently is on assist control mode has been comfortable right now without distress remains on 85% FiO2 still waiting CT scan which will try to see if he is able to go down for today  Medications: Reviewed on Rounds  Physical Exam:  Vitals: Temperature 97.0 pulse 85 respiratory 15 blood pressure 126/72 saturations 98%  Ventilator Settings mode ventilation assist control FiO2 35% tidal volume 500 PEEP 7  . General: Comfortable at this time . Eyes: Grossly normal lids, irises & conjunctiva . ENT: grossly tongue is normal . Neck: no obvious mass . Cardiovascular: S1 S2 normal no gallop . Respiratory: No rhonchi or rales are noted at this time . Abdomen: soft . Skin: no rash seen on limited exam . Musculoskeletal: not rigid . Psychiatric:unable to assess . Neurologic: no seizure no involuntary movements         Lab Data:   Basic Metabolic Panel: Recent Labs  Lab 10/27/19 0656 10/28/19 1028 10/30/19 0616  NA 141 141 147*  K 4.3 4.2 4.5  CL 101 103 108  CO2 29 28 31   GLUCOSE 150* 141* 153*  BUN 21 24* 31*  CREATININE 0.57* 0.47* 0.54*  CALCIUM 9.7 9.8 9.3  MG  --   --  2.0    ABG: Recent Labs  Lab 10/26/19 0820  PHART 7.415  PCO2ART 49.5*  PO2ART 52.7*  HCO3 31.1*  O2SAT 85.8    Liver Function Tests: No results for input(s): AST, ALT, ALKPHOS, BILITOT, PROT, ALBUMIN in the last 168 hours. No results for input(s): LIPASE, AMYLASE in the last 168 hours. No results for input(s): AMMONIA in the last 168 hours.  CBC: Recent Labs  Lab 10/27/19 0656 10/28/19 1028 10/30/19 0616  WBC 24.3* 30.8* 17.1*  HGB  10.8* 10.6* 10.0*  HCT 34.2* 33.7* 33.2*  MCV 98.6 98.0 100.9*  PLT 676* 655* 556*    Cardiac Enzymes: No results for input(s): CKTOTAL, CKMB, CKMBINDEX, TROPONINI in the last 168 hours.  BNP (last 3 results) Recent Labs    10/14/19 1007  BNP 87.2    ProBNP (last 3 results) No results for input(s): PROBNP in the last 8760 hours.  Radiological Exams: Dg Chest Port 1 View  Result Date: 10/29/2019 CLINICAL DATA:  Follow-up pneumonia. Acute on chronic respiratory failure with hypoxia. COPD. EXAM: PORTABLE CHEST 1 VIEW COMPARISON:  10/27/2019 FINDINGS: Tracheostomy tube and right pleural pigtail catheter remain in place. No pneumothorax visualized. There is persistent consolidation or atelectasis in the left retrocardiac lung base, and left pleural effusion cannot be excluded. Mild atelectasis in right lung base is also unchanged. IMPRESSION: 1. No pneumothorax visualized. 2. No significant change in left retrocardiac consolidation or atelectasis and possible pleural effusion. 3. Stable mild right basilar atelectasis. Electronically Signed   By: Marlaine Hind M.D.   On: 10/29/2019 08:19    Assessment/Plan Active Problems:   Chronic systolic CHF (congestive heart failure) (HCC)   Acute on chronic respiratory failure with hypoxia (HCC)   COVID-19 virus infection   Pneumonia due to COVID-19 virus   Sepsis due to Haemophilus influenzae with acute hypoxic respiratory failure and septic shock ([place])  Pneumothorax   1. Acute on chronic respiratory failure with hypoxia plan is to continue with full vent support try to wean FiO2 down saturations are better.  Once he is stabilized we will try to send him down for CT scan. 2. COVID-19 virus infection in recovery phase we will continue with supportive care. 3. Pneumonia due to COVID-19 slow improvement we will continue to follow 4. Pneumothorax needs follow-up CT as already discussed 5. Sepsis resolved we will continue with present  management   I have personally seen and evaluated the patient, evaluated laboratory and imaging results, formulated the assessment and plan and placed orders. The Patient requires high complexity decision making for assessment and support.  Case was discussed on Rounds with the Respiratory Therapy Staff  Yevonne Pax, MD [facility] Pulmonary Critical Care Medicine Sleep Medicine

## 2019-10-31 LAB — VANCOMYCIN, TROUGH: Vancomycin Tr: 21 ug/mL (ref 15–20)

## 2019-10-31 NOTE — Progress Notes (Signed)
Pulmonary Critical Care Medicine [place]   PULMONARY CRITICAL CARE SERVICE  PROGRESS NOTE  Date of Service: 10/31/2019  Sergio Mitchell  HTD:[id number]  DOB: 1951-05-19   DOA: 10/05/2019  Referring Physician: [name], MD  HPI: Sergio Mitchell is a 68 y.o. male seen for follow up of Acute on Chronic Respiratory Failure.  Patient is still on 90% oxygen has not had any improvement in the FiO2.  We have not been able to take him down for the CT scan suggested doing a lateral or decubitus film  Medications: Reviewed on Rounds  Physical Exam:  Vitals: Temperature 97.2 pulse 96 respiratory 26 blood pressure 141/88 saturations 93%  Ventilator Settings currently is on assist control FiO2 90% PEEP 7 tidal volume 571  . General: Comfortable at this time . Eyes: Grossly normal lids, irises & conjunctiva . ENT: grossly tongue is normal . Neck: no obvious mass . Cardiovascular: S1 S2 normal no gallop . Respiratory: No rhonchi coarse breath sounds are noted at this time . Abdomen: soft . Skin: no rash seen on limited exam . Musculoskeletal: not rigid . Psychiatric:unable to assess . Neurologic: no seizure no involuntary movements         Lab Data:   Basic Metabolic Panel: Recent Labs  Lab 10/27/19 0656 10/28/19 1028 10/30/19 0616  NA 141 141 147*  K 4.3 4.2 4.5  CL 101 103 108  CO2 29 28 31   GLUCOSE 150* 141* 153*  BUN 21 24* 31*  CREATININE 0.57* 0.47* 0.54*  CALCIUM 9.7 9.8 9.3  MG  --   --  2.0    ABG: Recent Labs  Lab 10/26/19 0820  PHART 7.415  PCO2ART 49.5*  PO2ART 52.7*  HCO3 31.1*  O2SAT 85.8    Liver Function Tests: No results for input(s): AST, ALT, ALKPHOS, BILITOT, PROT, ALBUMIN in the last 168 hours. No results for input(s): LIPASE, AMYLASE in the last 168 hours. No results for input(s): AMMONIA in the last 168 hours.  CBC: Recent Labs  Lab 10/27/19 0656 10/28/19 1028 10/30/19 0616  WBC 24.3* 30.8* 17.1*  HGB 10.8*  10.6* 10.0*  HCT 34.2* 33.7* 33.2*  MCV 98.6 98.0 100.9*  PLT 676* 655* 556*    Cardiac Enzymes: No results for input(s): CKTOTAL, CKMB, CKMBINDEX, TROPONINI in the last 168 hours.  BNP (last 3 results) Recent Labs    10/14/19 1007  BNP 87.2    ProBNP (last 3 results) No results for input(s): PROBNP in the last 8760 hours.  Radiological Exams: No results found.  Assessment/Plan Active Problems:   Chronic systolic CHF (congestive heart failure) (HCC)   Acute on chronic respiratory failure with hypoxia (HCC)   COVID-19 virus infection   Pneumonia due to COVID-19 virus   Sepsis due to Haemophilus influenzae with acute hypoxic respiratory failure and septic shock (HCC)   Pneumothorax   1. Acute on chronic respiratory failure hypoxia plan is to continue with full support on assist control mode currently on 90% FiO2 continue secretion management pulmonary toilet 2. Chronic systolic heart failure at baseline diuretics as tolerated 3. COVID-19 virus infection in resolution phase 4. Pneumonia due to COVID-19 has been treated 5. Sepsis resolved 6. Pneumothorax again having difficulty getting imaging done because of his high oxygen requirements we will try a lateral acute today as again I suspect that the pneumothorax is anterior as it was previously and therefore is not able to be seen on a regular AP view   I  have personally seen and evaluated the patient, evaluated laboratory and imaging results, formulated the assessment and plan and placed orders. The Patient requires high complexity decision making for assessment and support.  Case was discussed on Rounds with the Respiratory Therapy Staff  Allyne Gee, MD [facility] Pulmonary Critical Care Medicine Sleep Medicine

## 2019-11-01 ENCOUNTER — Other Ambulatory Visit (HOSPITAL_COMMUNITY): Payer: Medicare Other

## 2019-11-01 LAB — CBC
HCT: 37.4 % — ABNORMAL LOW (ref 39.0–52.0)
Hemoglobin: 11.5 g/dL — ABNORMAL LOW (ref 13.0–17.0)
MCH: 30.3 pg (ref 26.0–34.0)
MCHC: 30.7 g/dL (ref 30.0–36.0)
MCV: 98.4 fL (ref 80.0–100.0)
Platelets: 494 10*3/uL — ABNORMAL HIGH (ref 150–400)
RBC: 3.8 MIL/uL — ABNORMAL LOW (ref 4.22–5.81)
RDW: 15.2 % (ref 11.5–15.5)
WBC: 23.2 10*3/uL — ABNORMAL HIGH (ref 4.0–10.5)
nRBC: 0 % (ref 0.0–0.2)

## 2019-11-01 LAB — BASIC METABOLIC PANEL
Anion gap: 11 (ref 5–15)
BUN: 22 mg/dL (ref 8–23)
CO2: 29 mmol/L (ref 22–32)
Calcium: 9.4 mg/dL (ref 8.9–10.3)
Chloride: 102 mmol/L (ref 98–111)
Creatinine, Ser: 0.51 mg/dL — ABNORMAL LOW (ref 0.61–1.24)
GFR calc Af Amer: >60 mL/min (ref >60)
GFR calc non Af Amer: >60 mL/min (ref >60)
Glucose, Bld: 169 mg/dL — ABNORMAL HIGH (ref 70–99)
Potassium: 5 mmol/L (ref 3.5–5.1)
Sodium: 142 mmol/L (ref 135–145)

## 2019-11-01 NOTE — Progress Notes (Signed)
Pulmonary Critical Care Medicine [place]   PULMONARY CRITICAL CARE SERVICE  PROGRESS NOTE  Date of Service: 11/01/2019  Sergio Mitchell  GYI:[id number]  DOB: 1951-10-06   DOA: 10/14/2019  Referring Physician: [name], MD  HPI: Sergio Mitchell is a 68 y.o. male seen for follow up of Acute on Chronic Respiratory Failure.  Patient had a decubitus chest x-ray done today showed that the left lung may be completely collapsed.  Did a AP chest x-ray which confirms the findings.  There has been significant worsening since December 6.  This of course explains the reason for significant hypoxia.  Medications: Reviewed on Rounds  Physical Exam:  Vitals: Temperature 97.7 pulse 103 respiratory 22 blood pressure 160/91 saturations 93%  Ventilator Settings mode ventilation assist control FiO2 is 95% tidal volume 765 PEEP 7  . General: Comfortable at this time . Eyes: Grossly normal lids, irises & conjunctiva . ENT: grossly tongue is normal . Neck: no obvious mass . Cardiovascular: S1 S2 normal no gallop . Respiratory: Diminished breath sounds on the left . Abdomen: soft . Skin: no rash seen on limited exam . Musculoskeletal: not rigid . Psychiatric:unable to assess . Neurologic: no seizure no involuntary movements         Lab Data:   Basic Metabolic Panel: Recent Labs  Lab 10/27/19 0656 10/28/19 1028 10/30/19 0616 11/01/19 0403  NA 141 141 147* 142  K 4.3 4.2 4.5 5.0  CL 101 103 108 102  CO2 29 28 31 29   GLUCOSE 150* 141* 153* 169*  BUN 21 24* 31* 22  CREATININE 0.57* 0.47* 0.54* 0.51*  CALCIUM 9.7 9.8 9.3 9.4  MG  --   --  2.0  --     ABG: Recent Labs  Lab 10/26/19 0820  PHART 7.415  PCO2ART 49.5*  PO2ART 52.7*  HCO3 31.1*  O2SAT 85.8    Liver Function Tests: No results for input(s): AST, ALT, ALKPHOS, BILITOT, PROT, ALBUMIN in the last 168 hours. No results for input(s): LIPASE, AMYLASE in the last 168 hours. No results for input(s):  AMMONIA in the last 168 hours.  CBC: Recent Labs  Lab 10/27/19 0656 10/28/19 1028 10/30/19 0616 11/01/19 0403  WBC 24.3* 30.8* 17.1* 23.2*  HGB 10.8* 10.6* 10.0* 11.5*  HCT 34.2* 33.7* 33.2* 37.4*  MCV 98.6 98.0 100.9* 98.4  PLT 676* 655* 556* 494*    Cardiac Enzymes: No results for input(s): CKTOTAL, CKMB, CKMBINDEX, TROPONINI in the last 168 hours.  BNP (last 3 results) Recent Labs    10/14/19 1007  BNP 87.2    ProBNP (last 3 results) No results for input(s): PROBNP in the last 8760 hours.  Radiological Exams: Dg Chest Left Decubitus  Result Date: 11/01/2019 CLINICAL DATA:  Right-sided pneumothorax.  Decubitus film. EXAM: CHEST - LEFT DECUBITUS COMPARISON:  10/29/2019 FINDINGS: Right-side-up decubitus film. Endotracheal tube has tip 8.8 cm above the carina. Pigtail right pleural drainage catheter is present over the lateral right mid thorax. No evidence of right-sided pneumothorax. Opacification over the left lung likely atelectasis. IMPRESSION: Right-sided pigtail pleural drainage catheter remains in place over the lateral right mid thorax. No evidence of right-sided pneumothorax. Opacification left lung likely atelectasis. Endotracheal tube with tip 8.8 cm above the carina. Electronically Signed   By: Marin Olp M.D.   On: 11/01/2019 08:37   Dg Chest Port 1 View  Result Date: 11/01/2019 CLINICAL DATA:  Tracheostomy EXAM: PORTABLE CHEST 1 VIEW COMPARISON:  Earlier same day 11/01/2019 FINDINGS:  Endotracheal tube has been removed with placement of a tracheostomy device. Right pigtail chest tube remains present. No pneumothorax. Small right pleural effusion. Right basilar atelectasis. Persistent opacification of the left hemithorax. Cardiomediastinal contours are obscured. IMPRESSION: Interval placement of tracheostomy. Right chest tube remains present. No pneumothorax. Persistent opacification the left hemithorax, likely atelectasis. Small right pleural effusion and right  basilar atelectasis. Electronically Signed   By: Guadlupe Spanish M.D.   On: 11/01/2019 09:28    Assessment/Plan Active Problems:   Chronic systolic CHF (congestive heart failure) (HCC)   Acute on chronic respiratory failure with hypoxia (HCC)   COVID-19 virus infection   Pneumonia due to COVID-19 virus   Sepsis due to Haemophilus influenzae with acute hypoxic respiratory failure and septic shock (HCC)   Pneumothorax   1. Acute on chronic respiratory failure hypoxia patient has oxygen requirements of 95% secondary to the atelectasis.  The patient will need aggressive chest PT will place on Mucomyst and nebulizers.  If there is no improvement will do a bronchoscopy. 2. Chronic systolic heart failure appears to be compensated right now 3. COVID-19 virus infection in resolution phase 4. Pneumonia due to COVID-19 treated 5. Severe sepsis resolved 6. Pneumothorax does not appear to be recurring we will continue with supportive care   I have personally seen and evaluated the patient, evaluated laboratory and imaging results, formulated the assessment and plan and placed orders.  Time 35 minutes The Patient requires high complexity decision making for assessment and support.  Case was discussed on Rounds with the Respiratory Therapy Staff  Yevonne Pax, MD [facility] Pulmonary Critical Care Medicine Sleep Medicine

## 2019-11-02 LAB — BASIC METABOLIC PANEL
Anion gap: 9 (ref 5–15)
BUN: 30 mg/dL — ABNORMAL HIGH (ref 8–23)
CO2: 30 mmol/L (ref 22–32)
Calcium: 9.4 mg/dL (ref 8.9–10.3)
Chloride: 101 mmol/L (ref 98–111)
Creatinine, Ser: 0.57 mg/dL — ABNORMAL LOW (ref 0.61–1.24)
GFR calc Af Amer: >60 mL/min (ref >60)
GFR calc non Af Amer: >60 mL/min (ref >60)
Glucose, Bld: 167 mg/dL — ABNORMAL HIGH (ref 70–99)
Potassium: 4.7 mmol/L (ref 3.5–5.1)
Sodium: 140 mmol/L (ref 135–145)

## 2019-11-02 NOTE — Progress Notes (Signed)
Pulmonary Critical Care Medicine [facility] [place]   PULMONARY CRITICAL CARE SERVICE  PROGRESS NOTE  Date of Service: 11/02/2019  Sergio Mitchell  LZJ:[id number]  DOB: May 01, 1951   DOA: Nov 11, 2019  Referring Physician: Carron Curie, MD  HPI: Sergio Mitchell is a 68 y.o. male seen for follow up of Acute on Chronic Respiratory Failure.  Patient is on full support on assist control mode is oxygenation is improving it was down to about 60% has been more agitated reportedly by the staff.  I do want to recommend getting a follow-up chest x-ray perhaps in the morning since we have been treating his mucous plugging more aggressively to see that there is resolution if not then he will need a bronchoscopy  Medications: Reviewed on Rounds  Physical Exam:  Vitals: Temperature 97.0 pulse 58 respiratory rate 17 blood pressure is 134/73 saturations 97%  Ventilator Settings mode of ventilation assist control FiO2 60% tidal volume 500 PEEP 7  . General: Comfortable at this time . Eyes: Grossly normal lids, irises & conjunctiva . ENT: grossly tongue is normal . Neck: no obvious mass . Cardiovascular: S1 S2 normal no gallop . Respiratory: No rhonchi no rales are noted diminished on the left . Abdomen: soft . Skin: no rash seen on limited exam . Musculoskeletal: not rigid . Psychiatric:unable to assess . Neurologic: no seizure no involuntary movements         Lab Data:   Basic Metabolic Panel: Recent Labs  Lab 10/27/19 0656 10/28/19 1028 10/30/19 0616 11/01/19 0403 11/02/19 0505  NA 141 141 147* 142 140  K 4.3 4.2 4.5 5.0 4.7  CL 101 103 108 102 101  CO2 29 28 31 29 30   GLUCOSE 150* 141* 153* 169* 167*  BUN 21 24* 31* 22 30*  CREATININE 0.57* 0.47* 0.54* 0.51* 0.57*  CALCIUM 9.7 9.8 9.3 9.4 9.4  MG  --   --  2.0  --   --     ABG: No results for input(s): PHART, PCO2ART, PO2ART, HCO3, O2SAT in the last 168 hours.  Liver Function Tests: No results for input(s): AST,  ALT, ALKPHOS, BILITOT, PROT, ALBUMIN in the last 168 hours. No results for input(s): LIPASE, AMYLASE in the last 168 hours. No results for input(s): AMMONIA in the last 168 hours.  CBC: Recent Labs  Lab 10/27/19 0656 10/28/19 1028 10/30/19 0616 11/01/19 0403  WBC 24.3* 30.8* 17.1* 23.2*  HGB 10.8* 10.6* 10.0* 11.5*  HCT 34.2* 33.7* 33.2* 37.4*  MCV 98.6 98.0 100.9* 98.4  PLT 676* 655* 556* 494*    Cardiac Enzymes: No results for input(s): CKTOTAL, CKMB, CKMBINDEX, TROPONINI in the last 168 hours.  BNP (last 3 results) Recent Labs    10/14/19 1007  BNP 87.2    ProBNP (last 3 results) No results for input(s): PROBNP in the last 8760 hours.  Radiological Exams: DG Chest Left Decubitus  Result Date: 11/01/2019 CLINICAL DATA:  Right-sided pneumothorax.  Decubitus film. EXAM: CHEST - LEFT DECUBITUS COMPARISON:  10/29/2019 FINDINGS: Right-side-up decubitus film. Endotracheal tube has tip 8.8 cm above the carina. Pigtail right pleural drainage catheter is present over the lateral right mid thorax. No evidence of right-sided pneumothorax. Opacification over the left lung likely atelectasis. IMPRESSION: Right-sided pigtail pleural drainage catheter remains in place over the lateral right mid thorax. No evidence of right-sided pneumothorax. Opacification left lung likely atelectasis. Endotracheal tube with tip 8.8 cm above the carina. Electronically Signed   By: 14/04/2019 M.D.  On: 11/01/2019 08:37   DG CHEST PORT 1 VIEW  Result Date: 11/01/2019 CLINICAL DATA:  Tracheostomy EXAM: PORTABLE CHEST 1 VIEW COMPARISON:  Earlier same day 11/01/2019 FINDINGS: Endotracheal tube has been removed with placement of a tracheostomy device. Right pigtail chest tube remains present. No pneumothorax. Small right pleural effusion. Right basilar atelectasis. Persistent opacification of the left hemithorax. Cardiomediastinal contours are obscured. IMPRESSION: Interval placement of tracheostomy. Right  chest tube remains present. No pneumothorax. Persistent opacification the left hemithorax, likely atelectasis. Small right pleural effusion and right basilar atelectasis. Electronically Signed   By: Macy Mis M.D.   On: 11/01/2019 09:28    Assessment/Plan Active Problems:   Chronic systolic CHF (congestive heart failure) (HCC)   Acute on chronic respiratory failure with hypoxia (HCC)   COVID-19 virus infection   Pneumonia due to COVID-19 virus   Sepsis due to Haemophilus influenzae with acute hypoxic respiratory failure and septic shock (HCC)   Pneumothorax   1. Acute on chronic respiratory failure hypoxia plan is to continue with weaning FiO2 down we will get a follow-up chest x-ray in the morning 2. Chronic systolic heart failure compensated continue present management 3. COVID-19 virus infection treated we will continue to follow 4. Pneumonia due to COVID-19 treated clinically is improving 5. Sepsis resolved 6. Pneumothorax resolved   I have personally seen and evaluated the patient, evaluated laboratory and imaging results, formulated the assessment and plan and placed orders. The Patient requires high complexity decision making for assessment and support.  Case was discussed on Rounds with the Respiratory Therapy Staff  Allyne Gee, MD [facility] Pulmonary Critical Care Medicine Sleep Medicine

## 2019-11-02 NOTE — Progress Notes (Signed)
PROGRESS NOTE    CAVION FAIOLA  LXB:[id number] DOB: February 10, 1951 DOA: 09/25/2019  Brief Narrative:  Patient with trach, nonverbal.  History obtained from the medical records. DENILSON SALMINEN is an 68 y.o. male with history of chronic tracheostomy dependent respiratory failure, PEG tube dependent, systolic congestive heart failure, scoliosis, atrial fibrillation who was initially admitted to [facility] on 04/17/2019 with chest pain, shortness of breath.  He was diagnosed with cardiogenic shock with EF 20-25% and recommended for AICD.  Patient also had septic shock from healthcare associated pneumonia with BAL positive Haemophilus influenza A.  He was treated with broad-spectrum antimicrobials.  He required tracheostomy and PEG placement and was discharged to [facility] on 06/07/2019.  While at the outside facility he also had encephalopathy along with anxiety.  On 07/31/2019 he was seen at [facility] was treated and discharged back to [facility].  On 08/30/2019 was positive for COVID-19.  On 09/02/2019 he was transferred from [facility] back to [facility] with persistent hypoxemia in the setting of COVID-19.  He received Solu-Medrol from 09/02/2019 through 09/12/2019.  He also received treatment with remdesivir and convalescent plasma.  While at the outside facility he was noted to have chronic systolic congestive heart failure.  He has history of atrial fibrillation.  Due to his complex medical problems [facility]  He had CT done on 10/17/2019 which showed right-sided pneumothorax.  Patient also had some bleeding from the PEG tube site. He had chest tube placed.  He had fever, worsening leukocytosis.  Respiratory cultures collected on 10/16/2019 which showed Klebsiella pneumonia, KPC.  He is currently afebrile.  However, lab work from 11/01/2019 showing leukocytosis.  He had a chest x-ray done yesterday which showed persistent opacification of  the left hemithorax likely atelectasis, small right pleural effusion and right basilar atelectasis.   Assessment & Plan:   Active Problems:   Chronic systolic CHF (congestive heart failure) (HCC)   Acute on chronic respiratory failure with hypoxia (HCC)   COVID-19 virus infection   Pneumonia due to COVID-19 virus   Sepsis due to Haemophilus influenzae with acute hypoxic respiratory failure and septic shock (HCC)   Pneumothorax Pneumonia with Carbapenem resistant Enterobacteriaceae Leukocytosis COPD Atrial fibrillation Sacral pressure ulcer  Acute on chronic hypoxemic respiratory failure: Multifactorial.  Initially started with COVID-19 virus infection and pneumonia.  He was already treated for COVID-19 infection with remdesivir, convalescent plasma.  He also had superimposed Haemophilus influenzae which he was already treated with broad-spectrum antimicrobials.  He also has COPD. Most recent respiratory cultures showing Carbapenamase resistant Enterobacteriaceae.  Therefore started him on treatment with Avycaz.  11/02/2019: Chest x-ray from 11/01/2019  positive for persistent opacification of the left hemithorax likely atelectasis.  He is currently on treatment with IV vancomycin, Avycaz, Flagyl.  Is completing treatment with almost 2 weeks of Avycaz.  Tentative end date 11/04/2019. -However, he does have increasing oxygen requirements secondary to atelectasis.  Pulmonary following.  Aggressive chest PT per pulmonary.  On Mucomyst and nebulizers.  If he is not improving he may need a bronchoscopy.  Leukocytosis: Likely secondary to the pneumonia.  Antibiotics as mentioned above.  Continue to monitor counts.  Sepsis: He initially had COVID-19 infection with pneumonia.  Also had sepsis at the acute facility due to Haemophilus influenzae.  Already treated with broad-spectrum antimicrobials.  Currently sepsis has resolved.  However, he is at high risk for recurrent sepsis.  Pneumothorax: He  is status  post chest tube placement.  Further management per pulmonary and primary team.  COVID-19 infection: Patient already treated with IV remdesivir, convalescent plasma.  Continue supportive management.  Chronic systolic congestive heart failure: Continue medications and management per the primary team.  Appears to be stable right now but he is at high risk for CHF exacerbation.  COPD: Continue to medications and management per primary team.  Atrial fibrillation: Rate controlled.  Continue management per the primary team.  Sacral pressure ulcer: Likely stage II.  Continue local wound care. Due to his complex medical problems he is very high risk for worsening and decompensation.   Subjective: Chest x-ray from 11/01/2019 showing opacification of the left lung likely atelectasis.  FiO2 of 95%, PEEP of 7.  Objective: Vitals: Temperature 98.4 pulse 83, respiratory rate 20, blood pressure 128/76, oxygen saturation 90%  Examination: Constitutional:  Chronically ill-appearing male, awake Head: Atraumatic Eyes: PERLA, EOMI, irises appear normal, anicteric sclera,  ENMT: external ears and nose appear normal, normal hearing, poor dentition Neck: Has trach in place CVS: S1-S2, no murmur Respiratory:  Decreased breath sounds lower lobes especially on the left, occasional rhonchi, no wheezing Abdomen: soft nontender, bowel sounds heard Musculoskeletal: No edema Neuro: He has debility with generalized weakness Psych: stable mood and affect Skin: no rashes     Data Reviewed: I have personally reviewed following labs and imaging studies  CBC: Recent Labs  Lab 10/27/19 0656 10/28/19 1028 10/30/19 0616 11/01/19 0403  WBC 24.3* 30.8* 17.1* 23.2*  HGB 10.8* 10.6* 10.0* 11.5*  HCT 34.2* 33.7* 33.2* 37.4*  MCV 98.6 98.0 100.9* 98.4  PLT 676* 655* 556* 497*   Basic Metabolic Panel: Recent Labs  Lab 10/27/19 0656 10/28/19 1028 10/30/19 0616 11/01/19 0403 11/02/19 0505  NA  141 141 147* 142 140  K 4.3 4.2 4.5 5.0 4.7  CL 101 103 108 102 101  CO2 _0 GLUCOSE 150* 141* 153* 169* 167*  BUN 21 24* 31* 22 30*  CREATININE 0.57* 0.47* 0.54* 0.51* 0.57*  CALCIUM 9.7 9.8 9.3 9.4 9.4  MG  --   --  2.0  --   --    GFR: CrCl cannot be calculated (Unknown ideal weight.). Liver Function Tests: No results for input(s): AST, ALT, ALKPHOS, BILITOT, PROT, ALBUMIN in the last 168 hours. No results for input(s): LIPASE, AMYLASE in the last 168 hours. No results for input(s): AMMONIA in the last 168 hours. Coagulation Profile: No results for input(s): INR, PROTIME in the last 168 hours. Cardiac Enzymes: No results for input(s): CKTOTAL, CKMB, CKMBINDEX, TROPONINI in the last 168 hours. BNP (last 3 results) No results for input(s): PROBNP in the last 8760 hours. HbA1C: No results for input(s): HGBA1C in the last 72 hours. CBG: No results for input(s): GLUCAP in the last 168 hours. Lipid Profile: No results for input(s): CHOL, HDL, LDLCALC, TRIG, CHOLHDL, LDLDIRECT in the last 72 hours. Thyroid Function Tests: No results for input(s): TSH, T4TOTAL, FREET4, T3FREE, THYROIDAB in the last 72 hours. Anemia Panel: No results for input(s): VITAMINB12, FOLATE, FERRITIN, TIBC, IRON, RETICCTPCT in the last 72 hours. Sepsis Labs: Recent Labs  Lab 10/28/19 1020 10/28/19 1525  LATICACIDVEN 2.6* 2.6*    No results found for this or any previous visit (from the past 240 hour(s)).       Radiology Studies: DG Chest Left Decubitus  Result Date: 11/01/2019 CLINICAL DATA:  Right-sided pneumothorax.  Decubitus film. EXAM: CHEST - LEFT DECUBITUS COMPARISON:  10/29/2019 FINDINGS:  Right-side-up decubitus film. Endotracheal tube has tip 8.8 cm above the carina. Pigtail right pleural drainage catheter is present over the lateral right mid thorax. No evidence of right-sided pneumothorax. Opacification over the left lung likely atelectasis. IMPRESSION: Right-sided pigtail  pleural drainage catheter remains in place over the lateral right mid thorax. No evidence of right-sided pneumothorax. Opacification left lung likely atelectasis. Endotracheal tube with tip 8.8 cm above the carina. Electronically Signed   By: Marin Olp M.D.   On: 11/01/2019 08:37   DG CHEST PORT 1 VIEW  Result Date: 11/01/2019 CLINICAL DATA:  Tracheostomy EXAM: PORTABLE CHEST 1 VIEW COMPARISON:  Earlier same day 11/01/2019 FINDINGS: Endotracheal tube has been removed with placement of a tracheostomy device. Right pigtail chest tube remains present. No pneumothorax. Small right pleural effusion. Right basilar atelectasis. Persistent opacification of the left hemithorax. Cardiomediastinal contours are obscured. IMPRESSION: Interval placement of tracheostomy. Right chest tube remains present. No pneumothorax. Persistent opacification the left hemithorax, likely atelectasis. Small right pleural effusion and right basilar atelectasis. Electronically Signed   By: Macy Mis M.D.   On: 11/01/2019 09:28        Scheduled Meds: Please see MAR   Yaakov Guthrie, MD 11/02/2019, 2:16 PM

## 2019-11-03 ENCOUNTER — Other Ambulatory Visit (HOSPITAL_COMMUNITY): Payer: Medicare Other

## 2019-11-03 NOTE — Progress Notes (Signed)
Pulmonary Critical Care Medicine [place]   PULMONARY CRITICAL CARE SERVICE  PROGRESS NOTE  Date of Service: 11/03/2019  Sergio Mitchell  OMV:[id number]  DOB: Mar 06, 1951   DOA: 10/09/2019  Referring Physician: [name], MD  HPI: Sergio Mitchell is a 68 y.o. male seen for follow up of Acute on Chronic Respiratory Failure.  Patient currently is full support on the ventilator has been on assist control mode now is on 60% FiO2.  The follow-up chest x-ray shows significant improvement in aeration.  Medications: Reviewed on Rounds  Physical Exam:  Vitals: Temperature 98.1 pulse 84 respiratory rate 20 blood pressure 119/75 saturations 100%  Ventilator Settings mode ventilation assist control FiO2 60% tidal line 500 PEEP 5  . General: Comfortable at this time . Eyes: Grossly normal lids, irises & conjunctiva . ENT: grossly tongue is normal . Neck: no obvious mass . Cardiovascular: S1 S2 normal no gallop . Respiratory: No rhonchi no rales are noted at this time . Abdomen: soft . Skin: no rash seen on limited exam . Musculoskeletal: not rigid . Psychiatric:unable to assess . Neurologic: no seizure no involuntary movements         Lab Data:   Basic Metabolic Panel: Recent Labs  Lab 10/28/19 1028 10/30/19 0616 11/01/19 0403 11/02/19 0505  NA 141 147* 142 140  K 4.2 4.5 5.0 4.7  CL 103 108 102 101  CO2 28 31 29 30   GLUCOSE 141* 153* 169* 167*  BUN 24* 31* 22 30*  CREATININE 0.47* 0.54* 0.51* 0.57*  CALCIUM 9.8 9.3 9.4 9.4  MG  --  2.0  --   --     ABG: No results for input(s): PHART, PCO2ART, PO2ART, HCO3, O2SAT in the last 168 hours.  Liver Function Tests: No results for input(s): AST, ALT, ALKPHOS, BILITOT, PROT, ALBUMIN in the last 168 hours. No results for input(s): LIPASE, AMYLASE in the last 168 hours. No results for input(s): AMMONIA in the last 168 hours.  CBC: Recent Labs  Lab 10/28/19 1028 10/30/19 0616 11/01/19 0403  WBC  30.8* 17.1* 23.2*  HGB 10.6* 10.0* 11.5*  HCT 33.7* 33.2* 37.4*  MCV 98.0 100.9* 98.4  PLT 655* 556* 494*    Cardiac Enzymes: No results for input(s): CKTOTAL, CKMB, CKMBINDEX, TROPONINI in the last 168 hours.  BNP (last 3 results) Recent Labs    10/14/19 1007  BNP 87.2    ProBNP (last 3 results) No results for input(s): PROBNP in the last 8760 hours.  Radiological Exams: DG Chest Port 1 View  Result Date: 11/03/2019 CLINICAL DATA:  Follow-up right basilar atelectasis and opacified left hemithorax. EXAM: PORTABLE CHEST 1 VIEW COMPARISON:  11/01/2019 FINDINGS: Tracheostomy tube in satisfactory position. Interval aeration of the left mid and upper lung zones with persistent dense opacification at the left lung base. Increased linear and patchy density at the right lung base. A small caliber right chest tube remains in place with no pneumothorax. Tracheostomy tube in satisfactory position. Grossly normal sized heart. Stable mild to moderate diffuse prominence of the interstitial markings with an appearance compatible with chronic interstitial lung disease. Unremarkable bones. IMPRESSION: 1. Significantly improved aeration of the left lung with persistent dense atelectasis or pneumonia at the left lung base. 2. Increased right basilar atelectasis. 3. Stable chronic interstitial lung disease. Electronically Signed   By: Claudie Revering M.D.   On: 11/03/2019 10:25    Assessment/Plan Active Problems:   Chronic systolic CHF (congestive heart failure) ([place])  Acute on chronic respiratory failure with hypoxia (HCC)   COVID-19 virus infection   Pneumonia due to COVID-19 virus   Sepsis due to Haemophilus influenzae with acute hypoxic respiratory failure and septic shock (HCC)   Pneumothorax   1. Acute on chronic respiratory failure with hypoxia plan is to continue with full support on the ventilator.  Patient oxygenation has improved chest x-ray is also improved completely to wean FiO2 down  further. 2. Chronic systolic heart failure improving we will continue with supportive care monitor fluid status 3. COVID-19 virus infection in resolution phase 4. Pneumonia due to COVID-19 treated improving slowly 5. Sepsis resolved 6. Pneumothorax resolved 7. Atelectasis likely mucous plugging continue aggressive pulmonary toilet and supportive care   I have personally seen and evaluated the patient, evaluated laboratory and imaging results, formulated the assessment and plan and placed orders. The Patient requires high complexity decision making for assessment and support.  Case was discussed on Rounds with the Respiratory Therapy Staff  Yevonne Pax, MD [facility] Pulmonary Critical Care Medicine Sleep Medicine

## 2019-11-04 ENCOUNTER — Other Ambulatory Visit (HOSPITAL_COMMUNITY): Payer: Medicare Other

## 2019-11-04 LAB — CBC
HCT: 38.9 % — ABNORMAL LOW (ref 39.0–52.0)
Hemoglobin: 11.7 g/dL — ABNORMAL LOW (ref 13.0–17.0)
MCH: 30.2 pg (ref 26.0–34.0)
MCHC: 30.1 g/dL (ref 30.0–36.0)
MCV: 100.3 fL — ABNORMAL HIGH (ref 80.0–100.0)
Platelets: 396 10*3/uL (ref 150–400)
RBC: 3.88 MIL/uL — ABNORMAL LOW (ref 4.22–5.81)
RDW: 14.9 % (ref 11.5–15.5)
WBC: 22 10*3/uL — ABNORMAL HIGH (ref 4.0–10.5)
nRBC: 0 % (ref 0.0–0.2)

## 2019-11-04 LAB — BASIC METABOLIC PANEL
Anion gap: 10 (ref 5–15)
BUN: 24 mg/dL — ABNORMAL HIGH (ref 8–23)
CO2: 29 mmol/L (ref 22–32)
Calcium: 9.5 mg/dL (ref 8.9–10.3)
Chloride: 104 mmol/L (ref 98–111)
Creatinine, Ser: 0.59 mg/dL — ABNORMAL LOW (ref 0.61–1.24)
GFR calc Af Amer: >60 mL/min (ref >60)
GFR calc non Af Amer: >60 mL/min (ref >60)
Glucose, Bld: 133 mg/dL — ABNORMAL HIGH (ref 70–99)
Potassium: 4.7 mmol/L (ref 3.5–5.1)
Sodium: 143 mmol/L (ref 135–145)

## 2019-11-04 NOTE — Progress Notes (Signed)
Pulmonary Critical Care Medicine [facility] [place]   PULMONARY CRITICAL CARE SERVICE  PROGRESS NOTE  Date of Service: 11/04/2019  Sergio Mitchell  ZOX:[id number]  DOB: 29-Dec-1950   DOA: 10/10/2019  Referring Physician: Carron Curie, MD  HPI: Sergio Mitchell is a 68 y.o. male seen for follow up of Acute on Chronic Respiratory Failure.  Patient currently is on full support have to go up on the oxygen overnight the patient is chest x-ray still showing some lower lobe collapse.  This is related to underlying mucous plugging from excessive secretions and will probably need an airway examination  Medications: Reviewed on Rounds  Physical Exam:  Vitals: Temperature 97.0 pulse 104 respiratory 22 blood pressure 154/83 saturations 94%  Ventilator Settings mode ventilation assist control FiO2 was 100% tidal volume 500 PEEP 7  . General: Comfortable at this time . Eyes: Grossly normal lids, irises & conjunctiva . ENT: grossly tongue is normal . Neck: no obvious mass . Cardiovascular: S1 S2 normal no gallop . Respiratory: No rhonchi coarse breath sounds are noted diminished on the left . Abdomen: soft . Skin: no rash seen on limited exam . Musculoskeletal: not rigid . Psychiatric:unable to assess . Neurologic: no seizure no involuntary movements         Lab Data:   Basic Metabolic Panel: Recent Labs  Lab 10/30/19 0616 11/01/19 0403 11/02/19 0505 11/04/19 0429  NA 147* 142 140 143  K 4.5 5.0 4.7 4.7  CL 108 102 101 104  CO2 31 29 30 29   GLUCOSE 153* 169* 167* 133*  BUN 31* 22 30* 24*  CREATININE 0.54* 0.51* 0.57* 0.59*  CALCIUM 9.3 9.4 9.4 9.5  MG 2.0  --   --   --     ABG: No results for input(s): PHART, PCO2ART, PO2ART, HCO3, O2SAT in the last 168 hours.  Liver Function Tests: No results for input(s): AST, ALT, ALKPHOS, BILITOT, PROT, ALBUMIN in the last 168 hours. No results for input(s): LIPASE, AMYLASE in the last 168 hours. No results for input(s):  AMMONIA in the last 168 hours.  CBC: Recent Labs  Lab 10/30/19 0616 11/01/19 0403 11/04/19 0429  WBC 17.1* 23.2* 22.0*  HGB 10.0* 11.5* 11.7*  HCT 33.2* 37.4* 38.9*  MCV 100.9* 98.4 100.3*  PLT 556* 494* 396    Cardiac Enzymes: No results for input(s): CKTOTAL, CKMB, CKMBINDEX, TROPONINI in the last 168 hours.  BNP (last 3 results) Recent Labs    10/14/19 1007  BNP 87.2    ProBNP (last 3 results) No results for input(s): PROBNP in the last 8760 hours.  Radiological Exams: DG Chest Port 1 View  Result Date: 11/04/2019 CLINICAL DATA:  Short of breath EXAM: PORTABLE CHEST 1 VIEW COMPARISON:  Radiograph 11/03/2019 FINDINGS: Stable enlarged cardiac silhouette. Tracheostomy tube unchanged. There is dense LEFT retrocardiac opacity. Atelectasis the RIGHT lung base. RIGHT upper lobe is relatively clear. There is slight increase in airspace disease in the LEFT upper lobe. IMPRESSION: 1. Slight increase in airspace disease in LEFT upper lobe suggest edema or infiltrate. 2. Stable LEFT basilar atelectasis and LEFT retrocardiac opacity. Electronically Signed   By: 14/09/2019 M.D.   On: 11/04/2019 12:08   DG Chest Port 1 View  Result Date: 11/03/2019 CLINICAL DATA:  Follow-up right basilar atelectasis and opacified left hemithorax. EXAM: PORTABLE CHEST 1 VIEW COMPARISON:  11/01/2019 FINDINGS: Tracheostomy tube in satisfactory position. Interval aeration of the left mid and upper lung zones with persistent dense opacification at the  left lung base. Increased linear and patchy density at the right lung base. A small caliber right chest tube remains in place with no pneumothorax. Tracheostomy tube in satisfactory position. Grossly normal sized heart. Stable mild to moderate diffuse prominence of the interstitial markings with an appearance compatible with chronic interstitial lung disease. Unremarkable bones. IMPRESSION: 1. Significantly improved aeration of the left lung with persistent  dense atelectasis or pneumonia at the left lung base. 2. Increased right basilar atelectasis. 3. Stable chronic interstitial lung disease. Electronically Signed   By: Claudie Revering M.D.   On: 11/03/2019 10:25    Assessment/Plan Active Problems:   Chronic systolic CHF (congestive heart failure) (HCC)   Acute on chronic respiratory failure with hypoxia (HCC)   COVID-19 virus infection   Pneumonia due to COVID-19 virus   Sepsis due to Haemophilus influenzae with acute hypoxic respiratory failure and septic shock (HCC)   Pneumothorax   1. Acute on chronic respiratory failure with hypoxia patient has been having issues with mucus plugging I am going to schedule him tentatively for bronchoscopy in the morning we will keep n.p.o. and get a consent 2. Chronic systolic heart failure patient did have some increase in fluid overload discussed diuresis with primary care team 3. COVID-19 virus infection in resolution phase we will continue with's supportive care 4. Pneumonia due to COVID-19 with improvement on the chest x-ray however still has some lower lobe collapse 5. Sepsis resolved we will continue with supportive care 6. Pneumothorax resolved   I have personally seen and evaluated the patient, evaluated laboratory and imaging results, formulated the assessment and plan and placed orders. The Patient requires high complexity decision making for assessment and support.  Case was discussed on Rounds with the Respiratory Therapy Staff  Allyne Gee, MD [facility] Pulmonary Critical Care Medicine Sleep Medicine

## 2019-11-05 ENCOUNTER — Other Ambulatory Visit (HOSPITAL_COMMUNITY): Payer: Medicare Other

## 2019-11-05 DIAGNOSIS — J9811 Atelectasis: Secondary | ICD-10-CM

## 2019-11-05 MED FILL — Medication: Qty: 1 | Status: AC

## 2019-11-05 NOTE — Procedures (Signed)
Date: 11/05/2019,  MRN# [id number]    Procedure Note: Fiberoptic Bronchoscopy   PROCEDURE DATE: 11/05/2019     NAME:  Sergio Mitchell   DOB:11/07/1951   MRN: [id number] LOC:  [id number]      Indications/Preliminary Diagnosis: Pulmonary atelectasis  Consent: (Place X beside choice/s below)  The benefits, risks and possible complications of the procedure were        explained to:  ___ patient  _X__ patient's family  ___ other:___________  who verbalized understanding and gave:  ___ verbal  ___ written  ___ verbal and written  __X_ telephone  ___ other:________ consent.      Unable to obtain consent; procedure performed on emergent basis.     Other:      PRESEDATION ASSESSMENT: History and Physical has been performed. Patient meds and allergies have been reviewed. Presedation airway examination has been performed and documented. Baseline vital signs, sedation score, oxygenation status, and cardiac rhythm were reviewed. Patient was deemed to be in satisfactory condition to undergo the procedure.  PREMEDICATIONS:   Sedative/Narcotic Amt Dose   Versed 1 mg   Fentanyl 50 Mcg  Diprivan 0 mg         Insertion Route (Place X beside choice below)   Nasal   Oral   Endotracheal Tube  X Tracheostomy   INTRAPROCEDURE MEDICATIONS:  Sedative/Narcotic Amt Dose   Versed 1 mg   Fentanyl 0 mcg  Diprivan 0 mg       Medication Amt Dose  Medication Amt Dose  Xylocaine 2%  cc  Epinephrine 1:10,000 sol  cc  Xylocaine 4%  cc  Cocaine  cc   TECHNICAL PROCEDURES: (Place X beside choice below)   Procedures  Description  X  None     Electrocautery     Cryotherapy     Balloon Dilatation     Bronchography     Stent Placement     Therapeutic Aspiration     Laser/Argon Plasma            SPECIMENS (Sites): (Place X beside choice below)  Specimens Description   No Specimens Obtained     Washings   X Lavage    Biopsies    Fine Needle Aspirates    Brushings    Sputum     FINDINGS:  ESTIMATED BLOOD LOSS: none  COMPLICATIONS/RESOLUTION: none  PROCEDURE DETAILS: Timeout performed and correct patient, name, & ID confirmed. Following prep per Pulmonary policy, appropriate sedation was administered.  Airway exam proceeded with findings, technical procedures, and specimen collection as noted below. At the end of exam the scope was withdrawn without incident. Impression and Plan as noted below.    Procedure Note: After patient was properly prepared patient was given 1 mg Versed and 50 mcg of fentanyl preoperatively.  After adequate sedation was achieved the fiberoptic scope was inserted down through the tracheostomy tube.  The trachea was found to be somewhat erythematous.  From the left mainstem bronchus was emanating a large mucous plug which was thickened tenacious.  We were lavaging this area with normal saline and we were able to remove the mucous plug.  Going further into the left lung we followed the track of the mucous plugs and attempted to clear them is much as possible.  Also we did use Mucomyst to help break up some of the mucus.  Prior to the Mucomyst bronchoalveolar lavage was performed from the left lower lobe.  There was good return trap collection was secured and sent  to the lab for cultures.  The right lung was briefly examined however patient had some desaturations so we discontinued the procedure.  If it is necessary we will look at the right lung and a separate procedure day.  Overall patient procedure tolerance was good.  Patient did have some transient PVCs and when he was sedated his heart rate went to about 57 but quickly recovered into the upper 90s.    IMPRESSION:POST-PROCEDURE DX: Atelectasis of the lung secondary to mucous plugging and retained tracheal secretions   RECOMMENDATION/PLAN: We need to continue with Mucomyst as ordered along with albuterol.  Continue with aggressive pulmonary toilet.  Patient needs frequent suctioning we would  follow-up the chest x-ray in the morning.  I have personally performed the procedure as noted above     Allyne Gee, MD [facility] Pulmonary Critical Care Medicine

## 2019-11-05 NOTE — Progress Notes (Signed)
Pulmonary Critical Care Medicine [facility] [place]   PULMONARY CRITICAL CARE SERVICE  PROGRESS NOTE  Date of Service: 11/05/2019  Sergio Mitchell  OZH:[id number]  DOB: 20-Apr-1951   DOA: 10/22/2019  Referring Physician: Carron Curie, MD  HPI: Sergio Mitchell is a 68 y.o. male seen for follow up of Acute on Chronic Respiratory Failure.  Patient at this time is on full support on the ventilator.  Looked at the chest x-ray still showing that the lower lobe is down portion of the right middle lobe is also looking like it is losing volume patient right now is on a 100% FiO2.  We are going to plan on doing an airway examination and consent has been achieved patient is high risk for complications obviously due to 100% FiO2  Medications: Reviewed on Rounds  Physical Exam:  Vitals: Temperature 97.0 pulse 79 respiratory 20 blood pressure 121/65 saturations 94%  Ventilator Settings mode ventilation assist control FiO2 is 100% tidal volume 500 PEEP 7  . General: Comfortable at this time . Eyes: Grossly normal lids, irises & conjunctiva . ENT: grossly tongue is normal . Neck: no obvious mass . Cardiovascular: S1 S2 normal no gallop . Respiratory: No rhonchi coarse breath sounds diminished on the basis . Abdomen: soft . Skin: no rash seen on limited exam . Musculoskeletal: not rigid . Psychiatric:unable to assess . Neurologic: no seizure no involuntary movements         Lab Data:   Basic Metabolic Panel: Recent Labs  Lab 10/30/19 0616 11/01/19 0403 11/02/19 0505 11/04/19 0429  NA 147* 142 140 143  K 4.5 5.0 4.7 4.7  CL 108 102 101 104  CO2 31 29 30 29   GLUCOSE 153* 169* 167* 133*  BUN 31* 22 30* 24*  CREATININE 0.54* 0.51* 0.57* 0.59*  CALCIUM 9.3 9.4 9.4 9.5  MG 2.0  --   --   --     ABG: No results for input(s): PHART, PCO2ART, PO2ART, HCO3, O2SAT in the last 168 hours.  Liver Function Tests: No results for input(s): AST, ALT, ALKPHOS, BILITOT, PROT, ALBUMIN  in the last 168 hours. No results for input(s): LIPASE, AMYLASE in the last 168 hours. No results for input(s): AMMONIA in the last 168 hours.  CBC: Recent Labs  Lab 10/30/19 0616 11/01/19 0403 11/04/19 0429  WBC 17.1* 23.2* 22.0*  HGB 10.0* 11.5* 11.7*  HCT 33.2* 37.4* 38.9*  MCV 100.9* 98.4 100.3*  PLT 556* 494* 396    Cardiac Enzymes: No results for input(s): CKTOTAL, CKMB, CKMBINDEX, TROPONINI in the last 168 hours.  BNP (last 3 results) Recent Labs    10/14/19 1007  BNP 87.2    ProBNP (last 3 results) No results for input(s): PROBNP in the last 8760 hours.  Radiological Exams: DG CHEST PORT 1 VIEW  Result Date: 11/05/2019 CLINICAL DATA:  Mucous plugging of bronchi. EXAM: PORTABLE CHEST 1 VIEW COMPARISON:  November 04, 2019. FINDINGS: Stable cardiomediastinal silhouette. Tracheostomy tube is unchanged. Stable position of right-sided chest tube. No definite pneumothorax is noted. Stable bilateral lung opacities are noted, left greater than right, concerning for edema or pneumonia. Probable small left pleural effusion is noted. Bony thorax is unremarkable. IMPRESSION: Stable position of right-sided chest tube. No definite pneumothorax is noted. Stable bilateral lung opacities are noted, left greater than right, concerning for edema or pneumonia. Electronically Signed   By: November 06, 2019 M.D.   On: 11/05/2019 08:28   DG Chest [facility] 1 View  Result  Date: 11/04/2019 CLINICAL DATA:  Short of breath EXAM: PORTABLE CHEST 1 VIEW COMPARISON:  Radiograph 11/03/2019 FINDINGS: Stable enlarged cardiac silhouette. Tracheostomy tube unchanged. There is dense LEFT retrocardiac opacity. Atelectasis the RIGHT lung base. RIGHT upper lobe is relatively clear. There is slight increase in airspace disease in the LEFT upper lobe. IMPRESSION: 1. Slight increase in airspace disease in LEFT upper lobe suggest edema or infiltrate. 2. Stable LEFT basilar atelectasis and LEFT retrocardiac opacity.  Electronically Signed   By: Suzy Bouchard M.D.   On: 11/04/2019 12:08    Assessment/Plan Active Problems:   Chronic systolic CHF (congestive heart failure) (HCC)   Acute on chronic respiratory failure with hypoxia (HCC)   COVID-19 virus infection   Pneumonia due to COVID-19 virus   Sepsis due to Haemophilus influenzae with acute hypoxic respiratory failure and septic shock (HCC)   Pneumothorax   1. Acute on chronic respiratory failure hypoxia plan is to continue with aggressive pulmonary toilet Mucomyst as ordered along with nebulizers.  Also we are going to do an airway examination 2. Pulmonary atelectasis overall improved however still has residual deficits noted as above in the chest x-ray with some increased in the upper airway disease we will get cultures also sent 3. COVID-19 virus infection treated in resolution phase 4. Pneumonia due to COVID-19 actual infection has been resolved however patient still has residual deficits. 5. Sepsis treated resolved 6. Pneumothorax also has resolved we will continue with supportive care   I have personally seen and evaluated the patient, evaluated laboratory and imaging results, formulated the assessment and plan and placed orders. The Patient requires high complexity decision making for assessment and support.  Case was discussed on Rounds with the Respiratory Therapy Staff  Allyne Gee, MD [facility] Pulmonary Critical Care Medicine Sleep Medicine

## 2019-11-06 ENCOUNTER — Other Ambulatory Visit (HOSPITAL_COMMUNITY): Payer: Medicare Other

## 2019-11-06 LAB — BASIC METABOLIC PANEL
Anion gap: 7 (ref 5–15)
BUN: 20 mg/dL (ref 8–23)
CO2: 35 mmol/L — ABNORMAL HIGH (ref 22–32)
Calcium: 9.4 mg/dL (ref 8.9–10.3)
Chloride: 97 mmol/L — ABNORMAL LOW (ref 98–111)
Creatinine, Ser: 0.38 mg/dL — ABNORMAL LOW (ref 0.61–1.24)
GFR calc Af Amer: >60 mL/min (ref >60)
GFR calc non Af Amer: >60 mL/min (ref >60)
Glucose, Bld: 126 mg/dL — ABNORMAL HIGH (ref 70–99)
Potassium: 4.6 mmol/L (ref 3.5–5.1)
Sodium: 139 mmol/L (ref 135–145)

## 2019-11-06 LAB — CBC
HCT: 34.8 % — ABNORMAL LOW (ref 39.0–52.0)
Hemoglobin: 10.7 g/dL — ABNORMAL LOW (ref 13.0–17.0)
MCH: 30 pg (ref 26.0–34.0)
MCHC: 30.7 g/dL (ref 30.0–36.0)
MCV: 97.5 fL (ref 80.0–100.0)
Platelets: 301 10*3/uL (ref 150–400)
RBC: 3.57 MIL/uL — ABNORMAL LOW (ref 4.22–5.81)
RDW: 14.7 % (ref 11.5–15.5)
WBC: 24 10*3/uL — ABNORMAL HIGH (ref 4.0–10.5)
nRBC: 0 % (ref 0.0–0.2)

## 2019-11-06 LAB — ACID FAST SMEAR (AFB, MYCOBACTERIA): Acid Fast Smear: NEGATIVE

## 2019-11-06 NOTE — Progress Notes (Signed)
Pulmonary Critical Care Medicine [facility] [place]   PULMONARY CRITICAL CARE SERVICE  PROGRESS NOTE  Date of Service: 11/06/2019  Sergio Mitchell  EFE:[id number]  DOB: 1951-10-12   DOA: 10/17/2019  Referring Physician: Carron Curie, MD  HPI: Sergio Mitchell is a 68 y.o. male seen for follow up of Acute on Chronic Respiratory Failure.  Patient currently is on full support patient had a bronchoscopy done with copious amounts of secretions were retrieved from the left lung.  He is currently still on 100% FiO2 saturations are 98%.  Patient currently is also on a PEEP of 7  Medications: Reviewed on Rounds  Physical Exam:  Vitals: Temperature 96.5 pulse 54 respiratory rate is 15 blood pressure 113/66 saturations 98%  Ventilator Settings mode ventilation assist control FiO2 is 100% PEEP 7 tidal volume is 500  . General: Comfortable at this time . Eyes: Grossly normal lids, irises & conjunctiva . ENT: grossly tongue is normal . Neck: no obvious mass . Cardiovascular: S1 S2 normal no gallop . Respiratory: No rhonchi coarse breath sounds are noted at this time . Abdomen: soft . Skin: no rash seen on limited exam . Musculoskeletal: not rigid . Psychiatric:unable to assess . Neurologic: no seizure no involuntary movements         Lab Data:   Basic Metabolic Panel: Recent Labs  Lab 11/01/19 0403 11/02/19 0505 11/04/19 0429 11/06/19 0718  NA 142 140 143 139  K 5.0 4.7 4.7 4.6  CL 102 101 104 97*  CO2 29 30 29  35*  GLUCOSE 169* 167* 133* 126*  BUN 22 30* 24* 20  CREATININE 0.51* 0.57* 0.59* 0.38*  CALCIUM 9.4 9.4 9.5 9.4    ABG: No results for input(s): PHART, PCO2ART, PO2ART, HCO3, O2SAT in the last 168 hours.  Liver Function Tests: No results for input(s): AST, ALT, ALKPHOS, BILITOT, PROT, ALBUMIN in the last 168 hours. No results for input(s): LIPASE, AMYLASE in the last 168 hours. No results for input(s): AMMONIA in the last 168 hours.  CBC: Recent Labs   Lab 11/01/19 0403 11/04/19 0429 11/06/19 0718  WBC 23.2* 22.0* 24.0*  HGB 11.5* 11.7* 10.7*  HCT 37.4* 38.9* 34.8*  MCV 98.4 100.3* 97.5  PLT 494* 396 301    Cardiac Enzymes: No results for input(s): CKTOTAL, CKMB, CKMBINDEX, TROPONINI in the last 168 hours.  BNP (last 3 results) Recent Labs    10/14/19 1007  BNP 87.2    ProBNP (last 3 results) No results for input(s): PROBNP in the last 8760 hours.  Radiological Exams: DG Chest Port 1 View  Result Date: 11/06/2019 CLINICAL DATA:  Respiratory failure EXAM: PORTABLE CHEST 1 VIEW COMPARISON:  Yesterday FINDINGS: Tracheostomy tube in place. Airspace opacity at the left more than right base with generalized interstitial coarsening. There could be a small left pleural effusion. Enlarged appearance of the heart. Right-sided chest tube with no visible pneumothorax. Artifact from EKG leads. IMPRESSION: 1. Stable left more than right lower chest opacification. 2. Stable right chest tube positioning with no visible pneumothorax. Electronically Signed   By: 11/08/2019 M.D.   On: 11/06/2019 07:09   DG CHEST PORT 1 VIEW  Result Date: 11/05/2019 CLINICAL DATA:  Mucous plugging of bronchi. EXAM: PORTABLE CHEST 1 VIEW COMPARISON:  November 04, 2019. FINDINGS: Stable cardiomediastinal silhouette. Tracheostomy tube is unchanged. Stable position of right-sided chest tube. No definite pneumothorax is noted. Stable bilateral lung opacities are noted, left greater than right, concerning for edema or pneumonia. Probable  small left pleural effusion is noted. Bony thorax is unremarkable. IMPRESSION: Stable position of right-sided chest tube. No definite pneumothorax is noted. Stable bilateral lung opacities are noted, left greater than right, concerning for edema or pneumonia. Electronically Signed   By: Marijo Conception M.D.   On: 11/05/2019 08:28   DG Chest Port 1 View  Result Date: 11/04/2019 CLINICAL DATA:  Short of breath EXAM: PORTABLE CHEST  1 VIEW COMPARISON:  Radiograph 11/03/2019 FINDINGS: Stable enlarged cardiac silhouette. Tracheostomy tube unchanged. There is dense LEFT retrocardiac opacity. Atelectasis the RIGHT lung base. RIGHT upper lobe is relatively clear. There is slight increase in airspace disease in the LEFT upper lobe. IMPRESSION: 1. Slight increase in airspace disease in LEFT upper lobe suggest edema or infiltrate. 2. Stable LEFT basilar atelectasis and LEFT retrocardiac opacity. Electronically Signed   By: Suzy Bouchard M.D.   On: 11/04/2019 12:08    Assessment/Plan Active Problems:   Chronic systolic CHF (congestive heart failure) (HCC)   Acute on chronic respiratory failure with hypoxia (HCC)   COVID-19 virus infection   Pneumonia due to COVID-19 virus   Sepsis due to Haemophilus influenzae with acute hypoxic respiratory failure and septic shock (HCC)   Pneumothorax   1. Acute on chronic respiratory failure with hypoxia plan is to continue with full vent support spoke with the primary care team he does have some increased airspace disease in the left upper lobe with some improvement in the lower lobe.  Would recommend trying diuresis see if this helps.  Also cultures were sent from the bronchoscopy yesterday. 2. COVID-19 virus infection in resolution phase 3. Pneumonia due to COVID-19 still showing significant airspace disease as noted above 4. Sepsis has been treated resolved 5. Pneumothorax resolved 6. Chronic systolic heart failure again as noted patient should Improve Some Diuresis.   I have personally seen and evaluated the patient, evaluated laboratory and imaging results, formulated the assessment and plan and placed orders. The Patient requires high complexity decision making for assessment and support.  Case was discussed on Rounds with the Respiratory Therapy Staff  Allyne Gee, MD [facility] Pulmonary Critical Care Medicine Sleep Medicine

## 2019-11-07 NOTE — Progress Notes (Signed)
Pulmonary Critical Care Medicine [place]   PULMONARY CRITICAL CARE SERVICE  PROGRESS NOTE  Date of Service: 11/07/2019  Sergio Mitchell  IRW:[id number]  DOB: 1951-03-05   DOA: 10/21/2019  Referring Physician: [name], MD  HPI: Sergio Mitchell is a 68 y.o. male seen for follow up of Acute on Chronic Respiratory Failure.  Patient currently is on full support on assist control mode still requiring an FiO2 of 100%.  Medications: Reviewed on Rounds  Physical Exam:  Vitals: Temperature is 98.2 pulse 92 respiratory 20 blood pressure is 133/68 saturations 96%  Ventilator Settings mode ventilation assist control FiO2 100% PEEP 7 tidal line 500  . General: Comfortable at this time . Eyes: Grossly normal lids, irises & conjunctiva . ENT: grossly tongue is normal . Neck: no obvious mass . Cardiovascular: S1 S2 normal no gallop . Respiratory: No rhonchi coarse breath sounds are noted at this time . Abdomen: soft . Skin: no rash seen on limited exam . Musculoskeletal: not rigid . Psychiatric:unable to assess . Neurologic: no seizure no involuntary movements         Lab Data:   Basic Metabolic Panel: Recent Labs  Lab 11/01/19 0403 11/02/19 0505 11/04/19 0429 11/06/19 0718  NA 142 140 143 139  K 5.0 4.7 4.7 4.6  CL 102 101 104 97*  CO2 29 30 29  35*  GLUCOSE 169* 167* 133* 126*  BUN 22 30* 24* 20  CREATININE 0.51* 0.57* 0.59* 0.38*  CALCIUM 9.4 9.4 9.5 9.4    ABG: No results for input(s): PHART, PCO2ART, PO2ART, HCO3, O2SAT in the last 168 hours.  Liver Function Tests: No results for input(s): AST, ALT, ALKPHOS, BILITOT, PROT, ALBUMIN in the last 168 hours. No results for input(s): LIPASE, AMYLASE in the last 168 hours. No results for input(s): AMMONIA in the last 168 hours.  CBC: Recent Labs  Lab 11/01/19 0403 11/04/19 0429 11/06/19 0718  WBC 23.2* 22.0* 24.0*  HGB 11.5* 11.7* 10.7*  HCT 37.4* 38.9* 34.8*  MCV 98.4 100.3* 97.5  PLT  494* 396 301    Cardiac Enzymes: No results for input(s): CKTOTAL, CKMB, CKMBINDEX, TROPONINI in the last 168 hours.  BNP (last 3 results) Recent Labs    10/14/19 1007  BNP 87.2    ProBNP (last 3 results) No results for input(s): PROBNP in the last 8760 hours.  Radiological Exams: DG Chest Port 1 View  Result Date: 11/06/2019 CLINICAL DATA:  Respiratory failure EXAM: PORTABLE CHEST 1 VIEW COMPARISON:  Yesterday FINDINGS: Tracheostomy tube in place. Airspace opacity at the left more than right base with generalized interstitial coarsening. There could be a small left pleural effusion. Enlarged appearance of the heart. Right-sided chest tube with no visible pneumothorax. Artifact from EKG leads. IMPRESSION: 1. Stable left more than right lower chest opacification. 2. Stable right chest tube positioning with no visible pneumothorax. Electronically Signed   By: Monte Fantasia M.D.   On: 11/06/2019 07:09    Assessment/Plan Active Problems:   Chronic systolic CHF (congestive heart failure) (HCC)   Acute on chronic respiratory failure with hypoxia (HCC)   COVID-19 virus infection   Pneumonia due to COVID-19 virus   Sepsis due to Haemophilus influenzae with acute hypoxic respiratory failure and septic shock (HCC)   Pneumothorax   1. Acute on chronic respiratory failure hypoxia patient currently is on full vent support not tolerating any weaning of the FiO2 down. 2. Chronic congestive heart failure diurese as tolerated. 3. Atelectasis last chest  x-ray showing some atelectasis however overall is improving 4. COVID-19 virus infection in recovery phase 5. Pneumonia due to COVID-19 treated follow-up on cultures from sputum 6. Pneumothorax resolved 7. Sepsis resolved ID is following also follow-up on cultures   I have personally seen and evaluated the patient, evaluated laboratory and imaging results, formulated the assessment and plan and placed orders. The Patient requires high  complexity decision making for assessment and support.  Case was discussed on Rounds with the Respiratory Therapy Staff  Yevonne Pax, MD [facility] Pulmonary Critical Care Medicine Sleep Medicine

## 2019-11-08 NOTE — Progress Notes (Signed)
Pulmonary Critical Care Medicine [place]   PULMONARY CRITICAL CARE SERVICE  PROGRESS NOTE  Date of Service: 11/08/2019  Sergio Mitchell  OZD:[id number]  DOB: [birth year]   DOA: Oct 26, 2019  Referring Physician: [name], MD  HPI: Sergio Mitchell is a 68 y.o. male seen for follow up of Acute on Chronic Respiratory Failure.  Patient currently is on full support on assist control mode bronchoscopy apparently growing yeast primary care team is going to admit and start antibiotics for this.  Still remains on 95% FiO2 unfortunately  Medications: Reviewed on Rounds  Physical Exam:  Vitals: Temperature 97.2 pulse 90 respiratory 24 blood pressure 146/91 saturations 95%  Ventilator Settings mode of ventilation assist control FiO2 95% tidal volume 500 PEEP 7  . General: Comfortable at this time . Eyes: Grossly normal lids, irises & conjunctiva . ENT: grossly tongue is normal . Neck: no obvious mass . Cardiovascular: S1 S2 normal no gallop . Respiratory: No rhonchi no rales are noted at this time . Abdomen: soft . Skin: no rash seen on limited exam . Musculoskeletal: not rigid . Psychiatric:unable to assess . Neurologic: no seizure no involuntary movements         Lab Data:   Basic Metabolic Panel: Recent Labs  Lab 11/02/19 0505 11/04/19 0429 11/06/19 0718  NA 140 143 139  K 4.7 4.7 4.6  CL 101 104 97*  CO2 30 29 35*  GLUCOSE 167* 133* 126*  BUN 30* 24* 20  CREATININE 0.57* 0.59* 0.38*  CALCIUM 9.4 9.5 9.4    ABG: No results for input(s): PHART, PCO2ART, PO2ART, HCO3, O2SAT in the last 168 hours.  Liver Function Tests: No results for input(s): AST, ALT, ALKPHOS, BILITOT, PROT, ALBUMIN in the last 168 hours. No results for input(s): LIPASE, AMYLASE in the last 168 hours. No results for input(s): AMMONIA in the last 168 hours.  CBC: Recent Labs  Lab 11/04/19 0429 11/06/19 0718  WBC 22.0* 24.0*  HGB 11.7* 10.7*  HCT 38.9* 34.8*  MCV 100.3*  97.5  PLT 396 301    Cardiac Enzymes: No results for input(s): CKTOTAL, CKMB, CKMBINDEX, TROPONINI in the last 168 hours.  BNP (last 3 results) Recent Labs    10/14/19 1007  BNP 87.2    ProBNP (last 3 results) No results for input(s): PROBNP in the last 8760 hours.  Radiological Exams: No results found.  Assessment/Plan Active Problems:   Chronic systolic CHF (congestive heart failure) (HCC)   Acute on chronic respiratory failure with hypoxia (HCC)   COVID-19 virus infection   Pneumonia due to COVID-19 virus   Sepsis due to Haemophilus influenzae with acute hypoxic respiratory failure and septic shock (HCC)   Pneumothorax   1. Acute on chronic respiratory failure hypoxia plan is to continue with full support right now is not able to do any weaning 2. Chronic congestive heart failure is at baseline we will continue with present management 3. COVID-19 virus infection in resolution phase 4. Pneumonia due to COVID-19 treated has resolved however now is growing yeast patient will be treated as such 5. Sepsis hemodynamics are stable 6. Pneumothorax resolved   I have personally seen and evaluated the patient, evaluated laboratory and imaging results, formulated the assessment and plan and placed orders. The Patient requires high complexity decision making for assessment and support.  Case was discussed on Rounds with the Respiratory Therapy Staff  Allyne Gee, MD [facility] Pulmonary Critical Care Medicine Sleep Medicine

## 2019-11-08 NOTE — Progress Notes (Signed)
PROGRESS NOTE    Sergio Mitchell  HCW:[id number] DOB: 1951/07/02 DOA: 10/21/2019   Brief Narrative: Sergio Laredo Haysis an 68 y.o.malewith history of chronic tracheostomy dependent respiratory failure, PEG tube dependent, systolic congestive heart failure, scoliosis, atrial fibrillation who was initially admitted to [facility] on 04/17/2019 with chest pain, shortness of breath. He was diagnosed with cardiogenic shock with EF 20-25% and recommended for AICD. Patient also had septic shock from healthcare associated pneumonia with BAL positive Haemophilus influenza A. He was treated with broad-spectrum antimicrobials. He required tracheostomy and PEG placement and was discharged to [facility] on 06/07/2019. While at the outside facility he also had encephalopathy along with anxiety. On 07/31/2019 he was seen at [facility] was treated and discharged back to [facility]. On 08/30/2019 was positive for COVID-19. On 09/02/2019 he was transferred from [place] back to [facility] with persistent hypoxemia in the setting of COVID-19. He received Solu-Medrol from 09/02/2019 through 09/12/2019. He also received treatment with remdesivir and convalescent plasma. While at the outside facility he was noted to have chronic systolic congestive heart failure. He has history of atrial fibrillation. Due to his complex medical problems [facility]. He had CT done on 10/17/2019 which showed right-sided pneumothorax. Patient also had some bleeding from the PEG tube site. He had chest tube placed.He had fever, worsening leukocytosis. Respiratory cultures collected on 10/16/2019 which showed Klebsiella pneumonia, KPC.   He was treated with 2 weeks of Avycaz.  However, respiratory status not improving.  Chest x-ray still shows some lower lobe collapse.  Likely secondary to mucous plugging from excessive secretions.  He had bronchoscopy done with  copious amount of secretion related to be from the left lung.  Pulmonary respiratory cultures showing yeast.  He continues to be on 95% FiO2, PEEP of 7.  Assessment & Plan:   Active Problems:   Chronic systolic CHF (congestive heart failure) (HCC)   Acute on chronic respiratory failure with hypoxia (HCC)   COVID-19 virus infection   Pneumonia due to COVID-19 virus   Sepsis due to Haemophilus influenzae with acute hypoxic respiratory failure and septic shock (HCC)   Pneumothorax Pneumonia with Carbapenem resistant Enterobacteriaceae Leukocytosis COPD Atrial fibrillation Sacral pressure ulcer  Acute on chronic hypoxemic respiratory failure: Multifactorial. Initially started with COVID-19 virus infection and pneumonia. He was already treated for COVID-19 infection with remdesivir, convalescent plasma. He also had superimposed Haemophilus influenzaewhich he was already treated with broad-spectrum antimicrobials. He also has COPD.  Previous respiratory cultures showed Carbapenamaseresistant Enterobacteriaceae. Therefore was treated with 2 weeks of IV Avycaz.  Also treated with IV vancomycin, Flagyl. 11/08/2019: Despite receiving the above antibiotics he continued to have increasing oxygen requirements likely secondary to mucous plugging and atelectasis.  He underwent bronchoscopy per pulmonary  with copious amounts of secretions retained from the left lung.  Preliminary respiratory culture showing yeast.  Given that he still has leukocytosis with increasing oxygen requirement agree with starting IV Eraxis.  Follow-up on the final cultures and adjust antimicrobials accordingly.   Pulmonary following.  Leukocytosis: Likely secondary to the pneumonia. Antimicrobials as mentioned above. Continue to monitor counts.  If he starts having any diarrhea, would recommend to send stool for C. difficile and started on p.o. vancomycin.  Sepsis: He initially had COVID-19 infection with pneumonia. Also  had sepsis at the acute facility due to Haemophilus influenzae.Already treated with broad-spectrum antimicrobials. Currently sepsis has resolved. However, he is at high risk for recurrent  sepsis.  Pneumothorax: Resolved.  COVID-19 infection: Patient already treated with IV remdesivir, convalescent plasma. Continue supportive management.  Chronic systolic congestive heart failure: Continue medications and management per the primary team. Appears to be stable right now but he is at high risk for CHF exacerbation.  COPD: Continue to medications and management per primary team.  Atrial fibrillation: Rate controlled. Continue management per the primary team.  Sacral pressure ulcer: Likely stage II. Continue local wound care. Due to his complex medical problems he is very high risk for worsening and decompensation.  Subjective: He continues to be on 95% FiO2, PEEP of 7.  Continues to have leukocytosis.  Increased secretions.  Objective: Temperature 97.2, pulse 90, respiratory rate 24, blood pressure 146/91, oxygen saturation 94% on 95% FiO2, PEEP of 7  Examination: General exam: Chronically ill-appearing male, awake Head: Atraumatic Eyes: PERLA, EOMI, irises appear normal, anicteric sclera,  ENMT: external ears and nose appear normal, normal hearing,poor dentition Neck:Has trach in place CVS: S1-S2, no murmur Respiratory: Decreased breath sounds lower lobes, rhonchi, no wheezing Abdomen: binder in place, nontender,bowel sounds heard Musculoskeletal:No edema Neuro:He has debility with generalized weakness Psych:stable mood and affect Skin: no rashes     Data Reviewed: I have personally reviewed following labs and imaging studies  CBC: Recent Labs  Lab 11/04/19 0429 11/06/19 0718  WBC 22.0* 24.0*  HGB 11.7* 10.7*  HCT 38.9* 34.8*  MCV 100.3* 97.5  PLT 396 465   Basic Metabolic Panel: Recent Labs  Lab 11/02/19 0505 11/04/19 0429 11/06/19 0718  NA  140 143 139  K 4.7 4.7 4.6  CL 101 104 97*  CO2 30 29 35*  GLUCOSE 167* 133* 126*  BUN 30* 24* 20  CREATININE 0.57* 0.59* 0.38*  CALCIUM 9.4 9.5 9.4   GFR: CrCl cannot be calculated (Unknown ideal weight.). Liver Function Tests: No results for input(s): AST, ALT, ALKPHOS, BILITOT, PROT, ALBUMIN in the last 168 hours. No results for input(s): LIPASE, AMYLASE in the last 168 hours. No results for input(s): AMMONIA in the last 168 hours. Coagulation Profile: No results for input(s): INR, PROTIME in the last 168 hours. Cardiac Enzymes: No results for input(s): CKTOTAL, CKMB, CKMBINDEX, TROPONINI in the last 168 hours. BNP (last 3 results) No results for input(s): PROBNP in the last 8760 hours. HbA1C: No results for input(s): HGBA1C in the last 72 hours. CBG: No results for input(s): GLUCAP in the last 168 hours. Lipid Profile: No results for input(s): CHOL, HDL, LDLCALC, TRIG, CHOLHDL, LDLDIRECT in the last 72 hours. Thyroid Function Tests: No results for input(s): TSH, T4TOTAL, FREET4, T3FREE, THYROIDAB in the last 72 hours. Anemia Panel: No results for input(s): VITAMINB12, FOLATE, FERRITIN, TIBC, IRON, RETICCTPCT in the last 72 hours. Sepsis Labs: No results for input(s): PROCALCITON, LATICACIDVEN in the last 168 hours.  Recent Results (from the past 240 hour(s))  Acid Fast Smear (AFB)     Status: None   Collection Time: 11/05/19  3:30 PM   Specimen: Sputum  Result Value Ref Range Status   AFB Specimen Processing Concentration  Final   Acid Fast Smear Negative  Final    Comment: (NOTE) Performed At: [facility] [place], [place] [id number] Rush Farmer MD YF:[id number]    Source (AFB) BRONCHIAL ALVEOLAR LAVAGE  Final    Comment: Performed at [facility], [place] [address], [place], [place] [address]  Fungus Culture With Stain     Status: Abnormal (Preliminary result)   Collection Time: 11/05/19  3:30 PM  Result Value Ref Range Status    Fungus Stain Final report (A)  Final    Comment: (NOTE) Performed At: [facility] [place], [place] [id number] Rush Farmer MD [id number]    Fungus (Mycology) Culture PENDING  Incomplete   Fungal Source BRONCHIAL ALVEOLAR LAVAGE  Final    Comment: Performed at [facility], [place] [address] [place], [place] [address]  Culture, respiratory (non-expectorated)     Status: None (Preliminary result)   Collection Time: 11/05/19  3:30 PM   Specimen: Bronchoalveolar Lavage; Respiratory  Result Value Ref Range Status   Specimen Description BRONCHIAL ALVEOLAR LAVAGE  Final   Special Requests NONE  Final   Gram Stain   Final    ABUNDANT WBC PRESENT, PREDOMINANTLY PMN RARE YEAST Performed at [facility], [address] [place], [place] [address]    Culture ABUNDANT YEAST  Final   Report Status PENDING  Incomplete  Fungus Culture Result     Status: Abnormal   Collection Time: 11/05/19  3:30 PM  Result Value Ref Range Status   Result 1 Yeast observed (A)  Final    Comment: (NOTE) Performed At: [facility] [place], [place] [id number] Rush Farmer MD KQ:[id number]          Radiology Studies: No results found.   Scheduled Meds: Please see MAR   Yaakov Guthrie, MD  11/08/2019, 4:50 PM

## 2019-11-09 LAB — CULTURE, RESPIRATORY W GRAM STAIN

## 2019-11-09 NOTE — Progress Notes (Signed)
Pulmonary Critical Care Medicine [place]   PULMONARY CRITICAL CARE SERVICE  PROGRESS NOTE  Date of Service: 11/09/2019  Sergio Mitchell  VWU:[id number]  DOB: 1951/11/13   DOA: 2019-11-04  Referring Physician: [name], MD  HPI: Sergio Mitchell is a 68 y.o. male seen for follow up of Acute on Chronic Respiratory Failure.  Patient currently is on full support on assist control mode has been on 95% FiO2 being treated for presumed fungal pneumonia  Medications: Reviewed on Rounds  Physical Exam:  Vitals: Temperature 97.0 pulse 61 respiratory rate 18 blood pressure 143/79 saturations 94%  Ventilator Settings on assist control FiO2 is 95% tidal line 301 PEEP 7  . General: Comfortable at this time . Eyes: Grossly normal lids, irises & conjunctiva . ENT: grossly tongue is normal . Neck: no obvious mass . Cardiovascular: S1 S2 normal no gallop . Respiratory: No rhonchi no rales are noted at this time . Abdomen: soft . Skin: no rash seen on limited exam . Musculoskeletal: not rigid . Psychiatric:unable to assess . Neurologic: no seizure no involuntary movements         Lab Data:   Basic Metabolic Panel: Recent Labs  Lab 11/04/19 0429 11/06/19 0718  NA 143 139  K 4.7 4.6  CL 104 97*  CO2 29 35*  GLUCOSE 133* 126*  BUN 24* 20  CREATININE 0.59* 0.38*  CALCIUM 9.5 9.4    ABG: No results for input(s): PHART, PCO2ART, PO2ART, HCO3, O2SAT in the last 168 hours.  Liver Function Tests: No results for input(s): AST, ALT, ALKPHOS, BILITOT, PROT, ALBUMIN in the last 168 hours. No results for input(s): LIPASE, AMYLASE in the last 168 hours. No results for input(s): AMMONIA in the last 168 hours.  CBC: Recent Labs  Lab 11/04/19 0429 11/06/19 0718  WBC 22.0* 24.0*  HGB 11.7* 10.7*  HCT 38.9* 34.8*  MCV 100.3* 97.5  PLT 396 301    Cardiac Enzymes: No results for input(s): CKTOTAL, CKMB, CKMBINDEX, TROPONINI in the last 168 hours.  BNP (last 3  results) Recent Labs    10/14/19 1007  BNP 87.2    ProBNP (last 3 results) No results for input(s): PROBNP in the last 8760 hours.  Radiological Exams: No results found.  Assessment/Plan Active Problems:   Chronic systolic CHF (congestive heart failure) (HCC)   Acute on chronic respiratory failure with hypoxia (HCC)   COVID-19 virus infection   Pneumonia due to COVID-19 virus   Sepsis due to Haemophilus influenzae with acute hypoxic respiratory failure and septic shock (HCC)   Pneumothorax   1. Acute on chronic respiratory failure hypoxia plan is to continue with full support and assist with PEEP 7 and 95% FiO2 good volumes are noted patient is status post bronchoscopy for atelectasis 2. COVID-19 virus infection treated is in resolution phase 3. Pneumonia due to COVID-19 infectious disease is following the patient will continue with supportive care 4. Severe sepsis again ID following we will follow their recommendations has had blood cultures done. 5. Pneumothorax this has resolved   I have personally seen and evaluated the patient, evaluated laboratory and imaging results, formulated the assessment and plan and placed orders. The Patient requires high complexity decision making for assessment and support.  Case was discussed on Rounds with the Respiratory Therapy Staff  Allyne Gee, MD [facility] Pulmonary Critical Care Medicine Sleep Medicine

## 2019-11-10 LAB — BASIC METABOLIC PANEL
Anion gap: 8 (ref 5–15)
BUN: 31 mg/dL — ABNORMAL HIGH (ref 8–23)
CO2: 39 mmol/L — ABNORMAL HIGH (ref 22–32)
Calcium: 9.9 mg/dL (ref 8.9–10.3)
Chloride: 97 mmol/L — ABNORMAL LOW (ref 98–111)
Creatinine, Ser: 0.42 mg/dL — ABNORMAL LOW (ref 0.61–1.24)
GFR calc Af Amer: >60 mL/min (ref >60)
GFR calc non Af Amer: >60 mL/min (ref >60)
Glucose, Bld: 137 mg/dL — ABNORMAL HIGH (ref 70–99)
Potassium: 4.9 mmol/L (ref 3.5–5.1)
Sodium: 144 mmol/L (ref 135–145)

## 2019-11-10 LAB — CBC
HCT: 33.7 % — ABNORMAL LOW (ref 39.0–52.0)
Hemoglobin: 10 g/dL — ABNORMAL LOW (ref 13.0–17.0)
MCH: 30 pg (ref 26.0–34.0)
MCHC: 29.7 g/dL — ABNORMAL LOW (ref 30.0–36.0)
MCV: 101.2 fL — ABNORMAL HIGH (ref 80.0–100.0)
Platelets: 275 10*3/uL (ref 150–400)
RBC: 3.33 MIL/uL — ABNORMAL LOW (ref 4.22–5.81)
RDW: 14.6 % (ref 11.5–15.5)
WBC: 25.6 10*3/uL — ABNORMAL HIGH (ref 4.0–10.5)
nRBC: 0 % (ref 0.0–0.2)

## 2019-11-10 NOTE — Progress Notes (Signed)
Pulmonary Critical Care Medicine [place]   PULMONARY CRITICAL CARE SERVICE  PROGRESS NOTE  Date of Service: 11/10/2019  Sergio Mitchell  FUX:[id number]  DOB: 09/27/1951   DOA: 10/14/2019  Referring Physician: [name], MD  HPI: Sergio Mitchell is a 68 y.o. male seen for follow up of Acute on Chronic Respiratory Failure.  Patient currently is on full support on assist control mode has been still requiring high FiO2's now is on 95% FiO2.  Patient is on Eraxis failure to improve therapy  Medications: Reviewed on Rounds  Physical Exam:  Vitals: Temperature 97.6 pulse 82 respiratory rate 20 blood pressure 137/73 saturations 93%  Ventilator Settings mode ventilation assist control FiO2 95% tidal volume 750 PEEP 7  . General: Comfortable at this time . Eyes: Grossly normal lids, irises & conjunctiva . ENT: grossly tongue is normal . Neck: no obvious mass . Cardiovascular: S1 S2 normal no gallop . Respiratory: No rhonchi no rales are noted at this time . Abdomen: soft . Skin: no rash seen on limited exam . Musculoskeletal: not rigid . Psychiatric:unable to assess . Neurologic: no seizure no involuntary movements         Lab Data:   Basic Metabolic Panel: Recent Labs  Lab 11/04/19 0429 11/06/19 0718 11/10/19 0524  NA 143 139 144  K 4.7 4.6 4.9  CL 104 97* 97*  CO2 29 35* 39*  GLUCOSE 133* 126* 137*  BUN 24* 20 31*  CREATININE 0.59* 0.38* 0.42*  CALCIUM 9.5 9.4 9.9    ABG: No results for input(s): PHART, PCO2ART, PO2ART, HCO3, O2SAT in the last 168 hours.  Liver Function Tests: No results for input(s): AST, ALT, ALKPHOS, BILITOT, PROT, ALBUMIN in the last 168 hours. No results for input(s): LIPASE, AMYLASE in the last 168 hours. No results for input(s): AMMONIA in the last 168 hours.  CBC: Recent Labs  Lab 11/04/19 0429 11/06/19 0718 11/10/19 0524  WBC 22.0* 24.0* 25.6*  HGB 11.7* 10.7* 10.0*  HCT 38.9* 34.8* 33.7*  MCV 100.3* 97.5  101.2*  PLT 396 301 275    Cardiac Enzymes: No results for input(s): CKTOTAL, CKMB, CKMBINDEX, TROPONINI in the last 168 hours.  BNP (last 3 results) Recent Labs    10/14/19 1007  BNP 87.2    ProBNP (last 3 results) No results for input(s): PROBNP in the last 8760 hours.  Radiological Exams: No results found.  Assessment/Plan Active Problems:   Chronic systolic CHF (congestive heart failure) (HCC)   Acute on chronic respiratory failure with hypoxia (HCC)   COVID-19 virus infection   Pneumonia due to COVID-19 virus   Sepsis due to Haemophilus influenzae with acute hypoxic respiratory failure and septic shock (HCC)   Pneumothorax   1. Acute on chronic respiratory failure hypoxia patient continues to be requiring high FiO2 with 95% FiO2 not stable to CT scan done yet 2. Chronic systolic heart failure we will continue with supportive care 3. COVID-19 virus infection treated 4. Pneumonia due to COVID-19 has been treated still with residual changes noted on the x-rays. 5. Sepsis has been treated ID is following 6. Pneumothorax resolved   I have personally seen and evaluated the patient, evaluated laboratory and imaging results, formulated the assessment and plan and placed orders. The Patient requires high complexity decision making for assessment and support.  Case was discussed on Rounds with the Respiratory Therapy Staff  Allyne Gee, MD [facility] Pulmonary Critical Care Medicine Sleep Medicine

## 2019-11-11 ENCOUNTER — Other Ambulatory Visit (HOSPITAL_COMMUNITY): Payer: Medicare Other

## 2019-11-11 ENCOUNTER — Institutional Professional Consult (permissible substitution) (HOSPITAL_COMMUNITY): Payer: Medicare Other

## 2019-11-11 MED ORDER — IOHEXOL 350 MG/ML SOLN
100.0000 mL | Freq: Once | INTRAVENOUS | Status: AC | PRN
  Administered 2019-11-11: 12:00:00 69 mL via INTRAVENOUS

## 2019-11-11 NOTE — Progress Notes (Signed)
Pulmonary Critical Care Medicine [place]   PULMONARY CRITICAL CARE SERVICE  PROGRESS NOTE  Date of Service: 11/11/2019  Sergio Mitchell  [id number]:[id number]  DOB: 15-Aug-1951   DOA: 10/03/2019  Referring Physician: [name], MD  HPI: Sergio Mitchell is a 68 y.o. male seen for follow up of Acute on Chronic Respiratory Failure.  Patient currently is on full support on assist control mode right now is still requiring 95% FiO2 with a PEEP of 7.  Spoke with primary care team will try to see if we can get a CT scan done today  Medications: Reviewed on Rounds  Physical Exam:  Vitals: Temperature 98.7 pulse 52 respiratory rate is 20 blood pressure 124/66 saturations 98%  Ventilator Settings mode of ventilation assist control FiO2 is 95% tidal volume 416 PEEP 7  . General: Comfortable at this time . Eyes: Grossly normal lids, irises & conjunctiva . ENT: grossly tongue is normal . Neck: no obvious mass . Cardiovascular: S1 S2 normal no gallop . Respiratory: Coarse breath sounds with few rhonchi . Abdomen: soft . Skin: no rash seen on limited exam . Musculoskeletal: not rigid . Psychiatric:unable to assess . Neurologic: no seizure no involuntary movements         Lab Data:   Basic Metabolic Panel: Recent Labs  Lab 11/06/19 0718 11/10/19 0524  NA 139 144  K 4.6 4.9  CL 97* 97*  CO2 35* 39*  GLUCOSE 126* 137*  BUN 20 31*  CREATININE 0.38* 0.42*  CALCIUM 9.4 9.9    ABG: No results for input(s): PHART, PCO2ART, PO2ART, HCO3, O2SAT in the last 168 hours.  Liver Function Tests: No results for input(s): AST, ALT, ALKPHOS, BILITOT, PROT, ALBUMIN in the last 168 hours. No results for input(s): LIPASE, AMYLASE in the last 168 hours. No results for input(s): AMMONIA in the last 168 hours.  CBC: Recent Labs  Lab 11/06/19 0718 11/10/19 0524  WBC 24.0* 25.6*  HGB 10.7* 10.0*  HCT 34.8* 33.7*  MCV 97.5 101.2*  PLT 301 275    Cardiac Enzymes: No  results for input(s): CKTOTAL, CKMB, CKMBINDEX, TROPONINI in the last 168 hours.  BNP (last 3 results) Recent Labs    10/14/19 1007  BNP 87.2    ProBNP (last 3 results) No results for input(s): PROBNP in the last 8760 hours.  Radiological Exams: No results found.  Assessment/Plan Active Problems:   Chronic systolic CHF (congestive heart failure) (HCC)   Acute on chronic respiratory failure with hypoxia (HCC)   COVID-19 virus infection   Pneumonia due to COVID-19 virus   Sepsis due to Haemophilus influenzae with acute hypoxic respiratory failure and septic shock (HCC)   Pneumothorax   1. Acute on chronic respiratory failure hypoxia plan is to continue with on full vent support try to get a CT angio today 2. Chronic systolic heart failure monitor fluid status 3. COVID-19 virus infection in resolution phase 4. Pneumonia due to COVID-19 treated 5. Sepsis has resolved hemodynamics are stable 6. Pneumothorax status post chest tube drainage   I have personally seen and evaluated the patient, evaluated laboratory and imaging results, formulated the assessment and plan and placed orders. The Patient requires high complexity decision making for assessment and support.  Case was discussed on Rounds with the Respiratory Therapy Staff  Allyne Gee, MD [facility] Pulmonary Critical Care Medicine Sleep Medicine

## 2019-11-12 ENCOUNTER — Other Ambulatory Visit (HOSPITAL_COMMUNITY): Payer: Medicare Other

## 2019-11-12 LAB — BLOOD GAS, ARTERIAL
Acid-Base Excess: 18 mmol/L — ABNORMAL HIGH (ref 0.0–2.0)
Allens test (pass/fail): POSITIVE — AB
Bicarbonate: 44.4 mmol/L — ABNORMAL HIGH (ref 20.0–28.0)
FIO2: 100
O2 Saturation: 95.2 %
Patient temperature: 37
pCO2 arterial: 80.7 mmHg (ref 32.0–48.0)
pH, Arterial: 7.359 (ref 7.350–7.450)
pO2, Arterial: 76 mmHg — ABNORMAL LOW (ref 83.0–108.0)

## 2019-11-12 NOTE — Progress Notes (Signed)
Pulmonary Critical Care Medicine [name]   PULMONARY CRITICAL CARE SERVICE  PROGRESS NOTE  Date of Service: 11/12/2019  DORAL Mitchell  TKZ:[id number]  DOB: [birth year]   DOA: 10/11/2019  Referring Physician: [name], MD  HPI: Sergio Mitchell is a 68 y.o. male seen for follow up of Acute on Chronic Respiratory Failure.  Patient currently is on full support assist control mode has been on 100% FiO2 with a PEEP of seven  Medications: Reviewed on Rounds  Physical Exam:  Vitals: Temperature 97.4 pulse 82 respiratory rate 17 blood pressure is 151/76 saturations 96%  Ventilator Settings mode of ventilation assist control FiO2 is now up to 100% tidal volume 588 PEEP seven  . General: Comfortable at this time . Eyes: Grossly normal lids, irises & conjunctiva . ENT: grossly tongue is normal . Neck: no obvious mass . Cardiovascular: S1 S2 normal no gallop . Respiratory: Coarse breath sounds few rhonchi are noted . Abdomen: soft . Skin: no rash seen on limited exam . Musculoskeletal: not rigid . Psychiatric:unable to assess . Neurologic: no seizure no involuntary movements         Lab Data:   Basic Metabolic Panel: Recent Labs  Lab 11/06/19 0718 11/10/19 0524  NA 139 144  K 4.6 4.9  CL 97* 97*  CO2 35* 39*  GLUCOSE 126* 137*  BUN 20 31*  CREATININE 0.38* 0.42*  CALCIUM 9.4 9.9    ABG: Recent Labs  Lab 11/12/19 1132  PHART 7.359  PCO2ART 80.7*  PO2ART 76.0*  HCO3 44.4*  O2SAT 95.2    Liver Function Tests: No results for input(s): AST, ALT, ALKPHOS, BILITOT, PROT, ALBUMIN in the last 168 hours. No results for input(s): LIPASE, AMYLASE in the last 168 hours. No results for input(s): AMMONIA in the last 168 hours.  CBC: Recent Labs  Lab 11/06/19 0718 11/10/19 0524  WBC 24.0* 25.6*  HGB 10.7* 10.0*  HCT 34.8* 33.7*  MCV 97.5 101.2*  PLT 301 275    Cardiac Enzymes: No results for input(s): CKTOTAL, CKMB, CKMBINDEX, TROPONINI  in the last 168 hours.  BNP (last 3 results) Recent Labs    10/14/19 1007  BNP 87.2    ProBNP (last 3 results) No results for input(s): PROBNP in the last 8760 hours.  Radiological Exams: CT ANGIO CHEST PE W OR WO CONTRAST  Result Date: 11/11/2019 CLINICAL DATA:  68 year old male with fever and hypoxemia. EXAM: CT ANGIOGRAPHY CHEST WITH CONTRAST TECHNIQUE: Multidetector CT imaging of the chest was performed using the standard protocol during bolus administration of intravenous contrast. Multiplanar CT image reconstructions and MIPs were obtained to evaluate the vascular anatomy. CONTRAST:  8mL OMNIPAQUE IOHEXOL 350 MG/ML SOLN COMPARISON:  10/17/2019 CT and other studies FINDINGS: Cardiovascular: This is a technically adequate study but motion artifact in the LOWER lungs slightly decreases sensitivity. No pulmonary emboli are identified. Cardiomegaly again noted with small pericardial effusion. Aortic atherosclerotic calcifications are again noted without aneurysm. Mediastinum/Nodes: Enlarging mediastinal lymph nodes are noted since 10/17/2019. A 2 cm prevascular node (image 151: Series 7) previously measured 1.5 cm. Tracheostomy tube is identified. The visualized thyroid gland and esophagus are unremarkable. Lungs/Pleura: New bilateral interstitial and ground-glass opacities scattered throughout both lungs are identified suspicious for infection. A RIGHT thoracostomy tube is noted without evidence of pneumothorax. Increasing LEFT LOWER lobe atelectasis/collapse noted with stable to slightly increased posteromedial RIGHT LOWER lobe atelectasis/possible consolidation. A linear/slightly tubular opacity within the RIGHT UPPER lobe extending over a distance of  10 cm is noted, of uncertain etiology. This is difficult to visualized on radiographs. No pleural effusions are present. Upper Abdomen: No acute abnormality. Musculoskeletal: No acute or suspicious bony abnormalities are noted. Review of the MIP  images confirms the above findings. IMPRESSION: 1. New nonspecific bilateral interstitial and ground-glass opacities within both lungs, but suspicious for infection. 2. No evidence of pulmonary emboli. 3. Increasing LEFT LOWER lobe collapse with stable to slight increase in RIGHT LOWER lobe atelectasis/possible consolidation. 4. Linear tubular opacity within the RIGHT UPPER lobe of uncertain etiology but difficult to visualize on recent radiographs. Question possible procedure related. 5. RIGHT thoracostomy tube.  No evidence of pneumothorax. 6. Cardiomegaly and small pericardial effusion again noted. 7.  Aortic Atherosclerosis (ICD10-I70.0). Electronically Signed   By: Harmon Pier M.D.   On: 11/11/2019 12:46   DG CHEST PORT 1 VIEW  Result Date: 11/11/2019 CLINICAL DATA:  Positive COVID-19 test September 02, 2019.  Hypoxia. EXAM: PORTABLE CHEST 1 VIEW COMPARISON:  November 06, 2019 FINDINGS: Right chest tube with no right-sided pneumothorax. No left-sided pneumothorax identified. Dense opacification in the left base is stable. Opacity in the right mid and lower lung is stable. The cardiomediastinal silhouette is stable. The tracheostomy tube is stable. No other acute abnormalities. IMPRESSION: 1. Right chest tube with no pneumothorax. 2. Infiltrates in the right lung and left base are stable. Recommend follow-up to resolution. Electronically Signed   By: [name] M.D   On: 11/11/2019 11:23    Assessment/Plan Active Problems:   Chronic systolic CHF (congestive heart failure) (HCC)   Acute on chronic respiratory failure with hypoxia (HCC)   COVID-19 virus infection   Pneumonia due to COVID-19 virus   Sepsis due to Haemophilus influenzae with acute hypoxic respiratory failure and septic shock (HCC)   Pneumothorax   1. Acute on chronic respiratory failure with hypoxia patient did have a CT scan done of the chest there was no evidence of pulmonary embolism however patient has severe infiltrates  consistent with pneumonia.  Discussed antibiotic management with the primary care team I think because of the worsening of the findings it is reasonable to add empiric antibiotic regimen 2. Chronic systolic heart failure we will continue with supportive care diuretics as necessary. 3. COVID-19 virus infection this should have been resolved by now. 4. Pneumonia due to COVID-19 treated however still has significant changes on the chest x-ray 5. Severe sepsis hemodynamics appear to be stable however patient is not improving radiologically 6. Pneumothorax this has resolved   I have personally seen and evaluated the patient, evaluated laboratory and imaging results, formulated the assessment and plan and placed orders.  Time 35 minutes patient is critically ill in danger of cardiac arrest and death The Patient requires high complexity decision making for assessment and support.  Case was discussed on Rounds with the Respiratory Therapy Staff  Yevonne Pax, MD [facility] Pulmonary Critical Care Medicine Sleep Medicine

## 2019-11-13 LAB — CBC
HCT: 34.3 % — ABNORMAL LOW (ref 39.0–52.0)
Hemoglobin: 10.2 g/dL — ABNORMAL LOW (ref 13.0–17.0)
MCH: 30 pg (ref 26.0–34.0)
MCHC: 29.7 g/dL — ABNORMAL LOW (ref 30.0–36.0)
MCV: 100.9 fL — ABNORMAL HIGH (ref 80.0–100.0)
Platelets: 286 10*3/uL (ref 150–400)
RBC: 3.4 MIL/uL — ABNORMAL LOW (ref 4.22–5.81)
RDW: 14.5 % (ref 11.5–15.5)
WBC: 25.3 10*3/uL — ABNORMAL HIGH (ref 4.0–10.5)
nRBC: 0 % (ref 0.0–0.2)

## 2019-11-13 LAB — BASIC METABOLIC PANEL
Anion gap: 7 (ref 5–15)
BUN: 28 mg/dL — ABNORMAL HIGH (ref 8–23)
CO2: 42 mmol/L — ABNORMAL HIGH (ref 22–32)
Calcium: 9.6 mg/dL (ref 8.9–10.3)
Chloride: 95 mmol/L — ABNORMAL LOW (ref 98–111)
Creatinine, Ser: 0.35 mg/dL — ABNORMAL LOW (ref 0.61–1.24)
GFR calc Af Amer: >60 mL/min (ref >60)
GFR calc non Af Amer: >60 mL/min (ref >60)
Glucose, Bld: 166 mg/dL — ABNORMAL HIGH (ref 70–99)
Potassium: 4.7 mmol/L (ref 3.5–5.1)
Sodium: 144 mmol/L (ref 135–145)

## 2019-11-13 LAB — VANCOMYCIN, TROUGH: Vancomycin Tr: 10 ug/mL — ABNORMAL LOW (ref 15–20)

## 2019-11-13 NOTE — Progress Notes (Signed)
Pulmonary Critical Care Medicine [place]   PULMONARY CRITICAL CARE SERVICE  PROGRESS NOTE  Date of Service: 11/13/2019  Sergio Mitchell  BJS:[id number]  DOB: 15-May-1951   DOA: 11/12/19  Referring Physician: [name], MD  HPI: Sergio Mitchell is a 68 y.o. male seen for follow up of Acute on Chronic Respiratory Failure.  Patient currently is on full support on the ventilator still requiring 100% FiO2 chest x-ray still shows significant infiltrates antibiotics adjusted  Medications: Reviewed on Rounds  Physical Exam:  Vitals: Temperature 97.9 pulse 77 respiratory 21 blood pressure 146/74 saturations 96%  Ventilator Settings mode of ventilation assist control FiO2 100% tidal volume 574 PEEP 10  . General: Comfortable at this time . Eyes: Grossly normal lids, irises & conjunctiva . ENT: grossly tongue is normal . Neck: no obvious mass . Cardiovascular: S1 S2 normal no gallop . Respiratory: Coarse breath sounds with some scattered rhonchi . Abdomen: soft . Skin: no rash seen on limited exam . Musculoskeletal: not rigid . Psychiatric:unable to assess . Neurologic: no seizure no involuntary movements         Lab Data:   Basic Metabolic Panel: Recent Labs  Lab 11/10/19 0524 11/13/19 0552  NA 144 144  K 4.9 4.7  CL 97* 95*  CO2 39* 42*  GLUCOSE 137* 166*  BUN 31* 28*  CREATININE 0.42* 0.35*  CALCIUM 9.9 9.6    ABG: Recent Labs  Lab 11/12/19 1132  PHART 7.359  PCO2ART 80.7*  PO2ART 76.0*  HCO3 44.4*  O2SAT 95.2    Liver Function Tests: No results for input(s): AST, ALT, ALKPHOS, BILITOT, PROT, ALBUMIN in the last 168 hours. No results for input(s): LIPASE, AMYLASE in the last 168 hours. No results for input(s): AMMONIA in the last 168 hours.  CBC: Recent Labs  Lab 11/10/19 0524 11/13/19 0552  WBC 25.6* 25.3*  HGB 10.0* 10.2*  HCT 33.7* 34.3*  MCV 101.2* 100.9*  PLT 275 286    Cardiac Enzymes: No results for input(s):  CKTOTAL, CKMB, CKMBINDEX, TROPONINI in the last 168 hours.  BNP (last 3 results) Recent Labs    10/14/19 1007  BNP 87.2    ProBNP (last 3 results) No results for input(s): PROBNP in the last 8760 hours.  Radiological Exams: CT ANGIO CHEST PE W OR WO CONTRAST  Result Date: 11/11/2019 CLINICAL DATA:  68 year old male with fever and hypoxemia. EXAM: CT ANGIOGRAPHY CHEST WITH CONTRAST TECHNIQUE: Multidetector CT imaging of the chest was performed using the standard protocol during bolus administration of intravenous contrast. Multiplanar CT image reconstructions and MIPs were obtained to evaluate the vascular anatomy. CONTRAST:  77mL OMNIPAQUE IOHEXOL 350 MG/ML SOLN COMPARISON:  10/17/2019 CT and other studies FINDINGS: Cardiovascular: This is a technically adequate study but motion artifact in the LOWER lungs slightly decreases sensitivity. No pulmonary emboli are identified. Cardiomegaly again noted with small pericardial effusion. Aortic atherosclerotic calcifications are again noted without aneurysm. Mediastinum/Nodes: Enlarging mediastinal lymph nodes are noted since 10/17/2019. A 2 cm prevascular node (image 151: Series 7) previously measured 1.5 cm. Tracheostomy tube is identified. The visualized thyroid gland and esophagus are unremarkable. Lungs/Pleura: New bilateral interstitial and ground-glass opacities scattered throughout both lungs are identified suspicious for infection. A RIGHT thoracostomy tube is noted without evidence of pneumothorax. Increasing LEFT LOWER lobe atelectasis/collapse noted with stable to slightly increased posteromedial RIGHT LOWER lobe atelectasis/possible consolidation. A linear/slightly tubular opacity within the RIGHT UPPER lobe extending over a distance of 10 cm is noted,  of uncertain etiology. This is difficult to visualized on radiographs. No pleural effusions are present. Upper Abdomen: No acute abnormality. Musculoskeletal: No acute or suspicious bony  abnormalities are noted. Review of the MIP images confirms the above findings. IMPRESSION: 1. New nonspecific bilateral interstitial and ground-glass opacities within both lungs, but suspicious for infection. 2. No evidence of pulmonary emboli. 3. Increasing LEFT LOWER lobe collapse with stable to slight increase in RIGHT LOWER lobe atelectasis/possible consolidation. 4. Linear tubular opacity within the RIGHT UPPER lobe of uncertain etiology but difficult to visualize on recent radiographs. Question possible procedure related. 5. RIGHT thoracostomy tube.  No evidence of pneumothorax. 6. Cardiomegaly and small pericardial effusion again noted. 7.  Aortic Atherosclerosis (ICD10-I70.0). Electronically Signed   By: Harmon Pier M.D.   On: 11/11/2019 12:46   DG CHEST PORT 1 VIEW  Result Date: 11/12/2019 CLINICAL DATA:  Evaluation for flu EXAM: PORTABLE CHEST 1 VIEW COMPARISON:  November 11, 2019 FINDINGS: Again noted are ground-glass and interstitial lung markings bilaterally. This has remained stable since prior study. There is blunting of the right costophrenic angle. There is a dense area of consolidation involving the left lung base. There is no pneumothorax. The right-sided chest tube has been removed. The tracheostomy tube terminates above the carina. IMPRESSION: Status post removal of the right-sided chest tube without evidence for a right-sided pneumothorax. Otherwise, no significant interval change from prior study with persistent diffuse bilateral airspace opacities with dense consolidation at the left lung base. Electronically Signed   By: Katherine Mantle M.D.   On: 11/12/2019 18:44   DG CHEST PORT 1 VIEW  Result Date: 11/11/2019 CLINICAL DATA:  Positive COVID-19 test September 02, 2019.  Hypoxia. EXAM: PORTABLE CHEST 1 VIEW COMPARISON:  November 06, 2019 FINDINGS: Right chest tube with no right-sided pneumothorax. No left-sided pneumothorax identified. Dense opacification in the left base is  stable. Opacity in the right mid and lower lung is stable. The cardiomediastinal silhouette is stable. The tracheostomy tube is stable. No other acute abnormalities. IMPRESSION: 1. Right chest tube with no pneumothorax. 2. Infiltrates in the right lung and left base are stable. Recommend follow-up to resolution. Electronically Signed   By: [name] M.D   On: 11/11/2019 11:23    Assessment/Plan Active Problems:   Chronic systolic CHF (congestive heart failure) (HCC)   Acute on chronic respiratory failure with hypoxia (HCC)   COVID-19 virus infection   Pneumonia due to COVID-19 virus   Sepsis due to Haemophilus influenzae with acute hypoxic respiratory failure and septic shock (HCC)   Pneumothorax   1. Acute on chronic respiratory failure hypoxia plan is to continue with full support on assist control mode patient is not a candidate for weaning at this time.  The last chest film showed no pneumothorax still with significant infiltrates 2. COVID-19 virus infection now in recovery phase we will continue with supportive care 3. Pneumonia due to COVID-19 treated 4. Chronic systolic heart failure monitor fluid status and x-rays 5. Sepsis treated we will continue with present management 6. Pneumothorax resolved   I have personally seen and evaluated the patient, evaluated laboratory and imaging results, formulated the assessment and plan and placed orders. The Patient requires high complexity decision making for assessment and support.  Case was discussed on Rounds with the Respiratory Therapy Staff  Yevonne Pax, MD [facility] Pulmonary Critical Care Medicine Sleep Medicine

## 2019-11-14 ENCOUNTER — Institutional Professional Consult (permissible substitution) (HOSPITAL_COMMUNITY): Payer: Medicare Other

## 2019-11-14 NOTE — Progress Notes (Addendum)
Pulmonary Critical Care Medicine [place]   PULMONARY CRITICAL CARE SERVICE  PROGRESS NOTE  Date of Service: 11/14/2019  Sergio Mitchell  ZOX:[id number]  DOB: [birth year]   DOA: 11-17-19  Referring Physician: [name], MD  HPI: Sergio Mitchell is a 68 y.o. male seen for follow up of Acute on Chronic Respiratory Failure.  Patient currently is on full support assist control mode.  The family has been considering possibility of comfort care.  Primary care team did speak with the family today and they are going to discuss amongst themselves.  Patient basically has not been able to wean at all from 100% FiO2 probably is multifactorial including the history of the Covid infection having done some damage along with his history of congestive heart failure with an EF of 25%.  Altogether his prognosis is poor  Medications: Reviewed on Rounds  Physical Exam:  Vitals: Temperature 97.3 pulse 104 respiratory 27 blood pressure 129/56 saturations 93%  Ventilator Settings mode of ventilation assist control FiO2 is 100% PEEP of 12 tidal volume is 500  . General: Comfortable at this time . Eyes: Grossly normal lids, irises & conjunctiva . ENT: grossly tongue is normal . Neck: no obvious mass . Cardiovascular: S1 S2 normal no gallop . Respiratory: No rhonchi coarse breath sounds are noted . Abdomen: soft . Skin: no rash seen on limited exam . Musculoskeletal: not rigid . Psychiatric:unable to assess . Neurologic: no seizure no involuntary movements         Lab Data:   Basic Metabolic Panel: Recent Labs  Lab 11/10/19 0524 11/13/19 0552  NA 144 144  K 4.9 4.7  CL 97* 95*  CO2 39* 42*  GLUCOSE 137* 166*  BUN 31* 28*  CREATININE 0.42* 0.35*  CALCIUM 9.9 9.6    ABG: Recent Labs  Lab 11/12/19 1132  PHART 7.359  PCO2ART 80.7*  PO2ART 76.0*  HCO3 44.4*  O2SAT 95.2    Liver Function Tests: No results for input(s): AST, ALT, ALKPHOS, BILITOT, PROT, ALBUMIN  in the last 168 hours. No results for input(s): LIPASE, AMYLASE in the last 168 hours. No results for input(s): AMMONIA in the last 168 hours.  CBC: Recent Labs  Lab 11/10/19 0524 11/13/19 0552  WBC 25.6* 25.3*  HGB 10.0* 10.2*  HCT 33.7* 34.3*  MCV 101.2* 100.9*  PLT 275 286    Cardiac Enzymes: No results for input(s): CKTOTAL, CKMB, CKMBINDEX, TROPONINI in the last 168 hours.  BNP (last 3 results) Recent Labs    10/14/19 1007  BNP 87.2    ProBNP (last 3 results) No results for input(s): PROBNP in the last 8760 hours.  Radiological Exams: DG Chest Port 1 View  Result Date: 11/14/2019 CLINICAL DATA:  Pneumonia, shortness of breath, trach EXAM: PORTABLE CHEST 1 VIEW COMPARISON:  11/12/2019 FINDINGS: Tracheostomy remains in place, unchanged. Diffuse interstitial prominence throughout the lungs, increasing since prior study. More confluent airspace disease noted in the left lung base, similar to prior study. Cardiomegaly. No acute bony abnormality. IMPRESSION: Diffuse interstitial disease throughout the lungs is increased since prior study could reflect edema or infection. More confluent consolidation in the left lower lobe is stable. Electronically Signed   By: Rolm Baptise M.D.   On: 11/14/2019 09:48   DG CHEST PORT 1 VIEW  Result Date: 11/12/2019 CLINICAL DATA:  Evaluation for flu EXAM: PORTABLE CHEST 1 VIEW COMPARISON:  November 11, 2019 FINDINGS: Again noted are ground-glass and interstitial lung markings bilaterally. This has remained  stable since prior study. There is blunting of the right costophrenic angle. There is a dense area of consolidation involving the left lung base. There is no pneumothorax. The right-sided chest tube has been removed. The tracheostomy tube terminates above the carina. IMPRESSION: Status post removal of the right-sided chest tube without evidence for a right-sided pneumothorax. Otherwise, no significant interval change from prior study with  persistent diffuse bilateral airspace opacities with dense consolidation at the left lung base. Electronically Signed   By: Katherine Mantle M.D.   On: 11/12/2019 18:44    Assessment/Plan Active Problems:   Chronic systolic CHF (congestive heart failure) (HCC)   Acute on chronic respiratory failure with hypoxia (HCC)   COVID-19 virus infection   Pneumonia due to COVID-19 virus   Sepsis due to Haemophilus influenzae with acute hypoxic respiratory failure and septic shock (HCC)   Pneumothorax   1. Acute on chronic respiratory failure with hypoxia plan is to continue with full vent support in the meantime family is going to decide regarding possibility of comfort measures.  Patient's overall prognosis is felt to be poor secondary to multisystem organ involvement. 2. Chronic systolic heart failure poor ejection fraction patient has EF of 25%. 3. COVID-19 virus infection resolution phase but has still has significant damage to the lungs which is not showing improvement 4. Pneumonia due to COVID-19 treated 5. Severe sepsis treated right now hemodynamics are stable however has not recovered from the secondary pulmonary damage 6. Pneumothorax resolved   I have personally seen and evaluated the patient, evaluated laboratory and imaging results, formulated the assessment and plan and placed orders.  Time 35 minutes extended greater than 50% spent counseling and review of chart and discussion with the primary care team The Patient requires high complexity decision making for assessment and support.  Case was discussed on Rounds with the Respiratory Therapy Staff  Yevonne Pax, MD [facility] Pulmonary Critical Care Medicine Sleep Medicine

## 2019-11-15 LAB — BASIC METABOLIC PANEL
Anion gap: 8 (ref 5–15)
BUN: 31 mg/dL — ABNORMAL HIGH (ref 8–23)
CO2: 43 mmol/L — ABNORMAL HIGH (ref 22–32)
Calcium: 9.7 mg/dL (ref 8.9–10.3)
Chloride: 94 mmol/L — ABNORMAL LOW (ref 98–111)
Creatinine, Ser: 0.38 mg/dL — ABNORMAL LOW (ref 0.61–1.24)
GFR calc Af Amer: >60 mL/min (ref >60)
GFR calc non Af Amer: >60 mL/min (ref >60)
Glucose, Bld: 162 mg/dL — ABNORMAL HIGH (ref 70–99)
Potassium: 4.5 mmol/L (ref 3.5–5.1)
Sodium: 145 mmol/L (ref 135–145)

## 2019-11-15 LAB — CBC
HCT: 32.4 % — ABNORMAL LOW (ref 39.0–52.0)
Hemoglobin: 9.6 g/dL — ABNORMAL LOW (ref 13.0–17.0)
MCH: 29.2 pg (ref 26.0–34.0)
MCHC: 29.6 g/dL — ABNORMAL LOW (ref 30.0–36.0)
MCV: 98.5 fL (ref 80.0–100.0)
Platelets: 293 10*3/uL (ref 150–400)
RBC: 3.29 MIL/uL — ABNORMAL LOW (ref 4.22–5.81)
RDW: 14.5 % (ref 11.5–15.5)
WBC: 26.1 10*3/uL — ABNORMAL HIGH (ref 4.0–10.5)
nRBC: 0 % (ref 0.0–0.2)

## 2019-11-15 LAB — VANCOMYCIN, TROUGH: Vancomycin Tr: 17 ug/mL (ref 15–20)

## 2019-11-15 NOTE — Progress Notes (Signed)
PROGRESS NOTE    Sergio Mitchell  CZY:[id number] DOB: Apr 19, 1951 DOA: 10/20/2019   Brief Narrative:  Sergio Mitchell an 68 y.o.malewith history of chronic tracheostomy dependent respiratory failure, PEG tube dependent, systolic congestive heart failure, scoliosis, atrial fibrillation who was initially admitted to [facility] on 04/17/2019 with chest pain, shortness of breath. He was diagnosed with cardiogenic shock with EF 20-25% and recommended for AICD. Patient also had septic shock from healthcare associated pneumonia with BAL positive Haemophilus influenza A. He was treated with broad-spectrum antimicrobials. He required tracheostomy and PEG placement and was discharged to [facility] on 06/07/2019. While at the outside facility he also had encephalopathy along with anxiety. On 07/31/2019 he was seen at [facility] was treated and discharged back to [facility]. On 08/30/2019 was positive for COVID-19. On 09/02/2019 he was transferred from [place] back to [facility] with persistent hypoxemia in the setting of COVID-19. He received Solu-Medrol from 09/02/2019 through 09/12/2019. He also received treatment with remdesivir and convalescent plasma. While at the outside facility he was noted to have chronic systolic congestive heart failure. He has history of atrial fibrillation. Due to his complex medical problems [facility]. He had CT done on 10/17/2019 which showed right-sided pneumothorax. Patient also had some bleeding from the PEG tube site. He had chest tube placed.He had fever, worsening leukocytosis. Respiratory cultures collected on 10/16/2019 which showed Klebsiella pneumonia, KPC.  He was treated with 2 weeks of Avycaz.  However, respiratory status not improving.  Previous Chest x-ray still showed some lower lobe collapse.  Likely secondary to mucous plugging from excessive secretions.  He had bronchoscopy done on  11/05/2019 with copious amount of secretion from the left lung.   11/15/2019: Respiratory cultures showing Candida Dubliniencis.   He has been treated with Avycaz for 2 weeks.  On IV vancomycin, cefepime, anidulafungin also started due to the Candida on the respiratory cultures.  Continues to have leukocytosis.  Unfortunately, despite maximal antimicrobial therapy he has been requiring high FiO2, PEEP of 12.   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 Initially started with COVID-19 virus infection and pneumonia.  He also has systolic congestive heart failure with EF around 25%. He was already treated for COVID-19 infection with remdesivir, convalescent plasma. He also had superimposed Haemophilus influenzaewhich he was already treated with broad-spectrum antimicrobials. He also has COPD.  Previous respiratory cultures showed Carbapenamaseresistant Enterobacteriaceae.Therefore was treated with 2 weeks of IV Avycaz.  Also treated with IV vancomycin, Flagyl. - Despite receiving the above antibiotics he continued to have increasing oxygen requirements likely secondary to mucous plugging and atelectasis.  He underwent bronchoscopy on 11/05/2019 per pulmonary with copious amounts of secretions retained from the left lung.   11/15/2019:  Respiratory cultures showed Candida Dubliniencis.  He has been on treatment with IV vancomycin, cefepime, Eraxis.   Despite being on maximal antimicrobials he still has  leukocytosis.  Unfortunately continues to have increased oxygen requirement, PEEP of 12.  Sepsis: He initially had COVID-19 infection with pneumonia. Also had  sepsis at the acute facility due to Haemophilus influenzae.Already receiving treatment with broad-spectrum antimicrobials. Currently sepsis has resolved. However, he is at high risk for recurrent sepsis.  Pneumothorax: Resolved.  COVID-19 infection: Patient already treated with IV remdesivir, convalescent plasma. Continue supportive management.  Chronic systolic congestive heart failure: He has severe systolic congestive heart failure with EF around 25%.  Medications and management per primary team.  COPD: Continue medications and management per primary team.  Atrial fibrillation: Rate controlled. Continue management per the primary team.  Sacral pressure ulcer: Likely stage II. Continue local wound care.  Unfortunately despite maximal antimicrobial therapy patient is continuing to require almost 100% FiO2 and remains on a PEEP of 12.  He has multiple medical problems and also has severe congestive heart failure with EF around 25%.  Given his complex medical problems his overall prognosis is poor.  Primary team discussing with the family for further goals of care.  Subjective: He continues to have leukocytosis.  Still requiring high FiO2 with PEEP of 12.  Objective: Vitals: Temperature 99.3, pulse 130, respiratory 33, blood pressure 158/90, pulse oximetry 91% 100% FiO2, PEEP 12 Examination: General exam:Chronically ill-appearing male, awake Head: Atraumatic Eyes: PERLA, EOMI, irises appear normal, anicteric sclera,  ENMT: external ears and nose appear normal, normal hearing,poor dentition Neck:Has trach in place CVS: S1-S2, no murmur Respiratory:Decreased breath sounds lower lobes, rhonchi, no wheezing Abdomen: binder in place, nontender,bowel sounds heard Musculoskeletal:No edema Neuro:He has debility  with generalized weakness Psych:stable mood and affect Skin: no rashes    Data Reviewed: I have personally reviewed following labs and imaging studies  CBC: Recent Labs  Lab 11/10/19 0524 11/13/19 0552 11/15/19 0548  WBC 25.6* 25.3* 26.1*  HGB 10.0* 10.2* 9.6*  HCT 33.7* 34.3* 32.4*  MCV 101.2* 100.9* 98.5  PLT 275 286 496   Basic Metabolic Panel: Recent Labs  Lab 11/10/19 0524 11/13/19 0552 11/15/19 0548  NA 144 144 145  K 4.9 4.7 4.5  CL 97* 95* 94*  CO2 39* 42* 43*  GLUCOSE 137* 166* 162*  BUN 31* 28* 31*  CREATININE 0.42* 0.35* 0.38*  CALCIUM 9.9 9.6 9.7   GFR: CrCl cannot be calculated (Unknown ideal weight.). Liver Function Tests: No results for input(s): AST, ALT, ALKPHOS, BILITOT, PROT, ALBUMIN in the last 168 hours. No results for input(s): LIPASE, AMYLASE in the last 168 hours. No results for input(s): AMMONIA in the last 168 hours. Coagulation Profile: No results for input(s): INR, PROTIME in the last 168 hours. Cardiac Enzymes: No results for input(s): CKTOTAL, CKMB, CKMBINDEX, TROPONINI in the last 168 hours. BNP (last 3 results) No results for input(s): PROBNP in the last 8760 hours. HbA1C: No results for input(s): HGBA1C in the last 72 hours. CBG: No results for input(s): GLUCAP in the last 168 hours. Lipid Profile: No results for input(s): CHOL, HDL, LDLCALC, TRIG, CHOLHDL, LDLDIRECT in the last 72 hours. Thyroid Function Tests: No results for input(s): TSH, T4TOTAL, FREET4, T3FREE, THYROIDAB in the last 72 hours. Anemia Panel: No results for input(s): VITAMINB12, FOLATE, FERRITIN, TIBC, IRON, RETICCTPCT in the last 72 hours. Sepsis Labs: No results for input(s): PROCALCITON, LATICACIDVEN in the last 168 hours.  Recent Results (from the past 240 hour(s))  Acid Fast Smear (AFB)     Status: None   Collection Time: 11/05/19  3:30 PM   Specimen: Sputum  Result Value Ref Range Status   AFB Specimen Processing Concentration  Final   Acid  Fast Smear Negative  Final  Comment: (NOTE) Performed At: [facility] [place], [place] [id number] Rush Farmer MD DQ:[id number]    Source (AFB) BRONCHIAL ALVEOLAR LAVAGE  Final    Comment: Performed at [facility], [place] [address] [place], [place] [address]  Fungus Culture With Stain     Status: Abnormal (Preliminary result)   Collection Time: 11/05/19  3:30 PM  Result Value Ref Range Status   Fungus Stain Final report (A)  Final    Comment: (NOTE) Performed At: [facility] [place], [place] [id number] Rush Farmer MD HU:[id number]    Fungus (Mycology) Culture PENDING  Incomplete   Fungal Source BRONCHIAL ALVEOLAR LAVAGE  Final    Comment: Performed at [facility], [place] [address], [place], [place] [address]  Culture, respiratory (non-expectorated)     Status: None   Collection Time: 11/05/19  3:30 PM   Specimen: Bronchoalveolar Lavage; Respiratory  Result Value Ref Range Status   Specimen Description BRONCHIAL ALVEOLAR LAVAGE  Final   Special Requests NONE  Final   Gram Stain   Final    ABUNDANT WBC PRESENT, PREDOMINANTLY PMN RARE YEAST Performed at [facility], [address], [place],  [address]    Culture ABUNDANT CANDIDA DUBLINIENSIS  Final   Report Status 11/09/2019 FINAL  Final  Fungus Culture Result     Status: Abnormal   Collection Time: 11/05/19  3:30 PM  Result Value Ref Range Status   Result 1 Yeast observed (A)  Final    Comment: (NOTE) Performed At: [facility] [place], [place] [id number] Rush Farmer MD MC:[id number]          Radiology Studies: DG Chest Port 1 View  Result Date: 11/14/2019 CLINICAL DATA:  Pneumonia, shortness of breath, trach EXAM: PORTABLE CHEST 1 VIEW COMPARISON:  11/12/2019 FINDINGS: Tracheostomy remains in place, unchanged. Diffuse interstitial prominence throughout the lungs, increasing since prior study. More confluent  airspace disease noted in the left lung base, similar to prior study. Cardiomegaly. No acute bony abnormality. IMPRESSION: Diffuse interstitial disease throughout the lungs is increased since prior study could reflect edema or infection. More confluent consolidation in the left lower lobe is stable. Electronically Signed   By: Rolm Baptise M.D.   On: 11/14/2019 09:48        Scheduled Meds: Please see MAR   Yaakov Guthrie, MD 11/15/2019, 12:28 PM

## 2019-11-15 NOTE — Progress Notes (Signed)
Pulmonary Critical Care Medicine [place]   PULMONARY CRITICAL CARE SERVICE  PROGRESS NOTE  Date of Service: 11/15/2019  Sergio Mitchell  IFO:[id number]  DOB: [birth year]   DOA: 10/15/2019  Referring Physician: [name], MD  HPI: Sergio Mitchell is a 68 y.o. male seen for follow up of Acute on Chronic Respiratory Failure.  Patient currently is on board on the ventilator on assist control mode the saturations appear to be slightly better 98%.  Medications: Reviewed on Rounds  Physical Exam:  Vitals: Temperature 97.0 pulse 74 respiratory rate is 20 blood pressure 110/69 saturations 8%  Ventilator Settings mode of ventilation with assist control FiO2 100 and tidal volumes 518 PEEP 12  . General: Comfortable at this time . Eyes: Grossly normal lids, irises & conjunctiva . ENT: grossly tongue is normal . Neck: no obvious mass . Cardiovascular: S1 S2 normal no gallop . Respiratory: Rhonchi no rales are noted at this time . Abdomen: soft . Skin: no rash seen on limited exam . Musculoskeletal: not rigid . Psychiatric:unable to assess . Neurologic: no seizure no involuntary movements         Lab Data:   Basic Metabolic Panel: Recent Labs  Lab 11/10/19 0524 11/13/19 0552 11/15/19 0548  NA 144 144 145  K 4.9 4.7 4.5  CL 97* 95* 94*  CO2 39* 42* 43*  GLUCOSE 137* 166* 162*  BUN 31* 28* 31*  CREATININE 0.42* 0.35* 0.38*  CALCIUM 9.9 9.6 9.7    ABG: Recent Labs  Lab 11/12/19 1132  PHART 7.359  PCO2ART 80.7*  PO2ART 76.0*  HCO3 44.4*  O2SAT 95.2    Liver Function Tests: No results for input(s): AST, ALT, ALKPHOS, BILITOT, PROT, ALBUMIN in the last 168 hours. No results for input(s): LIPASE, AMYLASE in the last 168 hours. No results for input(s): AMMONIA in the last 168 hours.  CBC: Recent Labs  Lab 11/10/19 0524 11/13/19 0552 11/15/19 0548  WBC 25.6* 25.3* 26.1*  HGB 10.0* 10.2* 9.6*  HCT 33.7* 34.3* 32.4*  MCV 101.2* 100.9* 98.5   PLT 275 286 293    Cardiac Enzymes: No results for input(s): CKTOTAL, CKMB, CKMBINDEX, TROPONINI in the last 168 hours.  BNP (last 3 results) Recent Labs    10/14/19 1007  BNP 87.2    ProBNP (last 3 results) No results for input(s): PROBNP in the last 8760 hours.  Radiological Exams: DG Chest Port 1 View  Result Date: 11/14/2019 CLINICAL DATA:  Pneumonia, shortness of breath, trach EXAM: PORTABLE CHEST 1 VIEW COMPARISON:  11/12/2019 FINDINGS: Tracheostomy remains in place, unchanged. Diffuse interstitial prominence throughout the lungs, increasing since prior study. More confluent airspace disease noted in the left lung base, similar to prior study. Cardiomegaly. No acute bony abnormality. IMPRESSION: Diffuse interstitial disease throughout the lungs is increased since prior study could reflect edema or infection. More confluent consolidation in the left lower lobe is stable. Electronically Signed   By: Rolm Baptise M.D.   On: 11/14/2019 09:48    Assessment/Plan Active Problems:   Chronic systolic CHF (congestive heart failure) (HCC)   Acute on chronic respiratory failure with hypoxia (HCC)   COVID-19 virus infection   Pneumonia due to COVID-19 virus   Sepsis due to Haemophilus influenzae with acute hypoxic respiratory failure and septic shock (HCC)   Pneumothorax   1. Acute on chronic respiratory failure with hypoxia patient is doing fine with the oxygenation today is a little better so we will try to wean it  down 2. Acute on chronic of heart failure.  Chronically failure ejection fraction 20 to 25% poor prognosis 3. COVID-19 virus infection solution phase we will continue with present management 4. Pneumonia Covid 219 treated resolved 5. Sepsis clinically improving we will continue to monitor closely. 6. Pneumothorax resolved   I have personally seen and evaluated the patient, evaluated laboratory and imaging results, formulated the assessment and plan and placed  orders. The Patient requires high complexity decision making for assessment and support.  Case was discussed on Rounds with the Respiratory Therapy Staff  Yevonne Pax, MD [facility] Pulmonary Critical Care Medicine Sleep Medicine

## 2019-11-16 ENCOUNTER — Other Ambulatory Visit (HOSPITAL_COMMUNITY): Payer: Medicare Other

## 2019-11-16 NOTE — Progress Notes (Signed)
Pulmonary Critical Care Medicine [place]   PULMONARY CRITICAL CARE SERVICE  PROGRESS NOTE  Date of Service: 11/16/2019  Sergio Mitchell  NFA:[id number]  DOB: Jun 28, 1951   DOA: 10/23/2019  Referring Physician: [name], MD  HPI: Sergio Mitchell is a 68 y.o. male seen for follow up of Acute on Chronic Respiratory Failure.  Saturations are continue to show improvement oxygen was down to 65% currently is on a PEEP of 12  Medications: Reviewed on Rounds  Physical Exam:  Vitals: Temperature 96.4 pulse 74 respiratory 22 blood pressure is 116/46 saturations 95%  Ventilator Settings mode ventilation assist control FiO2 65% PEEP 12 tidal line 500  . General: Comfortable at this time . Eyes: Grossly normal lids, irises & conjunctiva . ENT: grossly tongue is normal . Neck: no obvious mass . Cardiovascular: S1 S2 normal no gallop . Respiratory: No rhonchi no rales are noted at this time . Abdomen: soft . Skin: no rash seen on limited exam . Musculoskeletal: not rigid . Psychiatric:unable to assess . Neurologic: no seizure no involuntary movements         Lab Data:   Basic Metabolic Panel: Recent Labs  Lab 11/10/19 0524 11/13/19 0552 11/15/19 0548  NA 144 144 145  K 4.9 4.7 4.5  CL 97* 95* 94*  CO2 39* 42* 43*  GLUCOSE 137* 166* 162*  BUN 31* 28* 31*  CREATININE 0.42* 0.35* 0.38*  CALCIUM 9.9 9.6 9.7    ABG: Recent Labs  Lab 11/12/19 1132  PHART 7.359  PCO2ART 80.7*  PO2ART 76.0*  HCO3 44.4*  O2SAT 95.2    Liver Function Tests: No results for input(s): AST, ALT, ALKPHOS, BILITOT, PROT, ALBUMIN in the last 168 hours. No results for input(s): LIPASE, AMYLASE in the last 168 hours. No results for input(s): AMMONIA in the last 168 hours.  CBC: Recent Labs  Lab 11/10/19 0524 11/13/19 0552 11/15/19 0548  WBC 25.6* 25.3* 26.1*  HGB 10.0* 10.2* 9.6*  HCT 33.7* 34.3* 32.4*  MCV 101.2* 100.9* 98.5  PLT 275 286 293    Cardiac  Enzymes: No results for input(s): CKTOTAL, CKMB, CKMBINDEX, TROPONINI in the last 168 hours.  BNP (last 3 results) Recent Labs    10/14/19 1007  BNP 87.2    ProBNP (last 3 results) No results for input(s): PROBNP in the last 8760 hours.  Radiological Exams: No results found.  Assessment/Plan Active Problems:   Chronic systolic CHF (congestive heart failure) (HCC)   Acute on chronic respiratory failure with hypoxia (HCC)   COVID-19 virus infection   Pneumonia due to COVID-19 virus   Sepsis due to Haemophilus influenzae with acute hypoxic respiratory failure and septic shock (HCC)   Pneumothorax   1. Acute on chronic respiratory failure with hypoxia patient is doing a little bit better perhaps secondary to diuretics and fluid management will continue to monitor the FiO2 right now is down to 65% 2. Chronic systolic heart failure low EF as previously noted 3. COVID-19 virus infection and now resolution phase 4. Pneumonia due to COVID-19 treated 5. Severe sepsis at baseline resolved we will continue with present management 6. Pneumothorax resolved   I have personally seen and evaluated the patient, evaluated laboratory and imaging results, formulated the assessment and plan and placed orders. The Patient requires high complexity decision making for assessment and support.  Case was discussed on Rounds with the Respiratory Therapy Staff  Allyne Gee, MD [facility] Pulmonary Critical Care Medicine Sleep Medicine

## 2019-11-17 NOTE — Progress Notes (Signed)
Pulmonary Critical Care Medicine [place]   PULMONARY CRITICAL CARE SERVICE  PROGRESS NOTE  Date of Service: 11/17/2019  Sergio Mitchell  KYH:[id number]  DOB: 04-26-1951   DOA: 11-16-2019  Referring Physician: [name], MD  HPI: Sergio Mitchell is a 68 y.o. male seen for follow up of Acute on Chronic Respiratory Failure.  Patient currently is on full support assist control mode has been on 60% FiO2 still getting fluids last chest x-ray showed possibility of pulmonary edema  Medications: Reviewed on Rounds  Physical Exam:  Vitals: Temperature is 98.6 pulse 101 respiratory rate 23 pressure 157/78 saturations 96%  Ventilator Settings mode ventilation assist control FiO2 60% tidal volume 578 PEEP 12  . General: Comfortable at this time . Eyes: Grossly normal lids, irises & conjunctiva . ENT: grossly tongue is normal . Neck: no obvious mass . Cardiovascular: S1 S2 normal no gallop . Respiratory: No rhonchi no rales are noted at this time . Abdomen: soft . Skin: no rash seen on limited exam . Musculoskeletal: not rigid . Psychiatric:unable to assess . Neurologic: no seizure no involuntary movements         Lab Data:   Basic Metabolic Panel: Recent Labs  Lab 11/13/19 0552 11/15/19 0548  NA 144 145  K 4.7 4.5  CL 95* 94*  CO2 42* 43*  GLUCOSE 166* 162*  BUN 28* 31*  CREATININE 0.35* 0.38*  CALCIUM 9.6 9.7    ABG: Recent Labs  Lab 11/12/19 1132  PHART 7.359  PCO2ART 80.7*  PO2ART 76.0*  HCO3 44.4*  O2SAT 95.2    Liver Function Tests: No results for input(s): AST, ALT, ALKPHOS, BILITOT, PROT, ALBUMIN in the last 168 hours. No results for input(s): LIPASE, AMYLASE in the last 168 hours. No results for input(s): AMMONIA in the last 168 hours.  CBC: Recent Labs  Lab 11/13/19 0552 11/15/19 0548  WBC 25.3* 26.1*  HGB 10.2* 9.6*  HCT 34.3* 32.4*  MCV 100.9* 98.5  PLT 286 293    Cardiac Enzymes: No results for input(s): CKTOTAL,  CKMB, CKMBINDEX, TROPONINI in the last 168 hours.  BNP (last 3 results) Recent Labs    10/14/19 1007  BNP 87.2    ProBNP (last 3 results) No results for input(s): PROBNP in the last 8760 hours.  Radiological Exams: DG Abd 1 View  Result Date: 11/16/2019 CLINICAL DATA:  NG tube placement EXAM: ABDOMEN - 1 VIEW COMPARISON:  10/19/2019 FINDINGS: NG tube tip is in the mid stomach. Prior cholecystectomy. Nonobstructive bowel gas pattern. IMPRESSION: NG tube tip in the mid stomach. Electronically Signed   By: Rolm Baptise M.D.   On: 11/16/2019 14:16    Assessment/Plan Active Problems:   Chronic systolic CHF (congestive heart failure) (HCC)   Acute on chronic respiratory failure with hypoxia (HCC)   COVID-19 virus infection   Pneumonia due to COVID-19 virus   Sepsis due to Haemophilus influenzae with acute hypoxic respiratory failure and septic shock (HCC)   Pneumothorax   1. Acute on chronic respiratory failure hypoxia plan is to continue with weaning FiO2 also have asked for the fluids to be stopped because of his poor ejection fraction think this may not be helping matters 2. Chronic systolic heart failure last chest film showed some fluid overload diuretics as tolerated also will stop the IV fluids 3. COVID-19 virus infection now in recovery phase 4. Pneumonia due to COVID-19 residual deficits noted on the chest film 5. Sepsis resolved 6. Pneumothorax resolved  I have personally seen and evaluated the patient, evaluated laboratory and imaging results, formulated the assessment and plan and placed orders. The Patient requires high complexity decision making for assessment and support.  Case was discussed on Rounds with the Respiratory Therapy Staff  Allyne Gee, MD [facility] Pulmonary Critical Care Medicine Sleep Medicine

## 2019-11-18 LAB — BLOOD GAS, ARTERIAL
Acid-Base Excess: 20.6 mmol/L — ABNORMAL HIGH (ref 0.0–2.0)
Acid-Base Excess: 20.6 mmol/L — ABNORMAL HIGH (ref 0.0–2.0)
Bicarbonate: 46.1 mmol/L — ABNORMAL HIGH (ref 20.0–28.0)
Bicarbonate: 46 mmol/L — ABNORMAL HIGH (ref 20.0–28.0)
FIO2: 100
FIO2: 100
O2 Saturation: 81.2 %
O2 Saturation: 75.2 %
Patient temperature: 37
Patient temperature: 37
pCO2 arterial: 64.4 mmHg — ABNORMAL HIGH (ref 32.0–48.0)
pCO2 arterial: 62.6 mmHg — ABNORMAL HIGH (ref 32.0–48.0)
pH, Arterial: 7.468 — ABNORMAL HIGH (ref 7.350–7.450)
pH, Arterial: 7.479 — ABNORMAL HIGH (ref 7.350–7.450)
pO2, Arterial: 49.1 mmHg — ABNORMAL LOW (ref 83.0–108.0)
pO2, Arterial: 41.4 mmHg — ABNORMAL LOW (ref 83.0–108.0)

## 2019-11-18 NOTE — Progress Notes (Signed)
Pulmonary Critical Care Medicine [place]   PULMONARY CRITICAL CARE SERVICE  PROGRESS NOTE  Date of Service: 11/18/2019  Sergio Mitchell  XIP:[id number]  DOB: 1950/12/02   DOA: 2019-11-07  Referring Physician: [name], MD  HPI: Sergio Mitchell is a 68 y.o. male seen for follow up of Acute on Chronic Respiratory Failure.  Patient is currently on full support and FiO2 was increased to 100% currently fluid was discontinued patient is fluid overloaded primary care team is going to increase his diuretics  Medications: Reviewed on Rounds  Physical Exam:  Vitals: Temperature 97.1 pulse 85 respiratory 25 blood pressure one 1/75 saturations 92%  Ventilator Settings mode ventilation assist control FiO2 is 100% tidal volume 550 PEEP 14  . General: Comfortable at this time . Eyes: Grossly normal lids, irises & conjunctiva . ENT: grossly tongue is normal . Neck: no obvious mass . Cardiovascular: S1 S2 normal no gallop . Respiratory: No rhonchi coarse breath sounds are noted at this time . Abdomen: soft . Skin: no rash seen on limited exam . Musculoskeletal: not rigid . Psychiatric:unable to assess . Neurologic: no seizure no involuntary movements         Lab Data:   Basic Metabolic Panel: Recent Labs  Lab 11/13/19 0552 11/15/19 0548  NA 144 145  K 4.7 4.5  CL 95* 94*  CO2 42* 43*  GLUCOSE 166* 162*  BUN 28* 31*  CREATININE 0.35* 0.38*  CALCIUM 9.6 9.7    ABG: Recent Labs  Lab 11/12/19 1132 11/18/19 0430  PHART 7.359 7.468*  PCO2ART 80.7* 64.4*  PO2ART 76.0* 49.1*  HCO3 44.4* 46.1*  O2SAT 95.2 81.2    Liver Function Tests: No results for input(s): AST, ALT, ALKPHOS, BILITOT, PROT, ALBUMIN in the last 168 hours. No results for input(s): LIPASE, AMYLASE in the last 168 hours. No results for input(s): AMMONIA in the last 168 hours.  CBC: Recent Labs  Lab 11/13/19 0552 11/15/19 0548  WBC 25.3* 26.1*  HGB 10.2* 9.6*  HCT 34.3* 32.4*   MCV 100.9* 98.5  PLT 286 293    Cardiac Enzymes: No results for input(s): CKTOTAL, CKMB, CKMBINDEX, TROPONINI in the last 168 hours.  BNP (last 3 results) Recent Labs    10/14/19 1007  BNP 87.2    ProBNP (last 3 results) No results for input(s): PROBNP in the last 8760 hours.  Radiological Exams: DG Abd 1 View  Result Date: 11/16/2019 CLINICAL DATA:  NG tube placement EXAM: ABDOMEN - 1 VIEW COMPARISON:  10/19/2019 FINDINGS: NG tube tip is in the mid stomach. Prior cholecystectomy. Nonobstructive bowel gas pattern. IMPRESSION: NG tube tip in the mid stomach. Electronically Signed   By: Rolm Baptise M.D.   On: 11/16/2019 14:16    Assessment/Plan Active Problems:   Chronic systolic CHF (congestive heart failure) (HCC)   Acute on chronic respiratory failure with hypoxia (HCC)   COVID-19 virus infection   Pneumonia due to COVID-19 virus   Sepsis due to Haemophilus influenzae with acute hypoxic respiratory failure and septic shock (HCC)   Pneumothorax   1. Acute on chronic respiratory failure with hypoxia patient is going to continue with full support on assist control mode right now is on 100% FiO2 hopefully once the diuretics take effect will be able to wean him back down again yesterday patient had been down to 60% FiO2 2. Chronic systolic heart failure needs more aggressive diuretics patient has poor ejection fraction 25% 3. COVID-19 virus infection resolution phase 4.  Pneumonia due to COVID-19 treated we will continue with supportive care 5. Sepsis resolved has been completing antibiotics 6. Pneumothorax resolved   I have personally seen and evaluated the patient, evaluated laboratory and imaging results, formulated the assessment and plan and placed orders. The Patient requires high complexity decision making for assessment and support.  Case was discussed on Rounds with the Respiratory Therapy Staff  Yevonne Pax, MD [facility] Pulmonary Critical Care Medicine Sleep  Medicine

## 2019-11-19 ENCOUNTER — Other Ambulatory Visit (HOSPITAL_COMMUNITY): Payer: Medicare Other

## 2019-11-24 NOTE — Progress Notes (Signed)
Pulmonary Critical Care Medicine [facility] [place]   PULMONARY CRITICAL CARE SERVICE  PROGRESS NOTE  Date of Service: 10/31/2019  SEBRON MCMAHILL  NKN:[id number]  DOB: 1950-12-27   DOA: 2019/10/27  Referring Physician: Carron Curie, MD  HPI: ACEN CRAUN is a 69 y.o. male seen for follow up of Acute on Chronic Respiratory Failure.  This morning apparently patient had increased work of breathing desaturations chest x-ray was ordered which shows recurrence of pneumothorax patient has been requiring high levels of PEEP.  The primary care team also did speak with the patient's family and they are now leaning towards comfort measures apparently did not come in today and discuss further.  Medications: Reviewed on Rounds  Physical Exam:  Vitals: Temperature 98.1 pulse 91 respiratory 16 blood pressure 107/69 saturations 90%  Ventilator Settings mode ventilation assist control FiO2 100% tidal volume was 450 PEEP 14  . General: Comfortable at this time . Eyes: Grossly normal lids, irises & conjunctiva . ENT: grossly tongue is normal . Neck: no obvious mass . Cardiovascular: S1 S2 normal no gallop . Respiratory: No rhonchi coarse breath sounds are noted at this time . Abdomen: soft . Skin: no rash seen on limited exam . Musculoskeletal: not rigid . Psychiatric:unable to assess . Neurologic: no seizure no involuntary movements         Lab Data:   Basic Metabolic Panel: Recent Labs  Lab 11/13/19 0552 11/15/19 0548  NA 144 145  K 4.7 4.5  CL 95* 94*  CO2 42* 43*  GLUCOSE 166* 162*  BUN 28* 31*  CREATININE 0.35* 0.38*  CALCIUM 9.6 9.7    ABG: Recent Labs  Lab 11/18/19 0430 11/18/19 2120  PHART 7.468* 7.479*  PCO2ART 64.4* 62.6*  PO2ART 49.1* 41.4*  HCO3 46.1* 46.0*  O2SAT 81.2 75.2    Liver Function Tests: No results for input(s): AST, ALT, ALKPHOS, BILITOT, PROT, ALBUMIN in the last 168 hours. No results for input(s): LIPASE, AMYLASE in the last 168  hours. No results for input(s): AMMONIA in the last 168 hours.  CBC: Recent Labs  Lab 11/13/19 0552 11/15/19 0548  WBC 25.3* 26.1*  HGB 10.2* 9.6*  HCT 34.3* 32.4*  MCV 100.9* 98.5  PLT 286 293    Cardiac Enzymes: No results for input(s): CKTOTAL, CKMB, CKMBINDEX, TROPONINI in the last 168 hours.  BNP (last 3 results) Recent Labs    10/14/19 1007  BNP 87.2    ProBNP (last 3 results) No results for input(s): PROBNP in the last 8760 hours.  Radiological Exams: DG CHEST PORT 1 VIEW  Result Date: 11/17/2019 CLINICAL DATA:  Pneumonia. EXAM: PORTABLE CHEST 1 VIEW COMPARISON:  11/14/2019 FINDINGS: Tracheostomy tube in adequate position. Enteric tube courses into the stomach and off the film as tip is not visualized. There is a new large left-sided pneumothorax. Mild interval worsening of hazy airspace process throughout the right lung most prominent over the mid to upper lung. Cardiomediastinal silhouette and remainder of the exam is unchanged. IMPRESSION: 1.  New large left-sided pneumothorax. 2. Slight interval worsening of hazy airspace process over the right lung likely multifocal infection. 3.  Tubes and lines as described. Critical Value/emergent results were called by telephone at the time of interpretation on 10/27/2019 at 11:23 am to providerALI HIJAZI , who verbally acknowledged these results. Electronically Signed   By: Elberta Fortis M.D.   On: 11/02/2019 11:23    Assessment/Plan Active Problems:   Chronic systolic CHF (congestive heart failure) (HCC)  Acute on chronic respiratory failure with hypoxia (HCC)   COVID-19 virus infection   Pneumonia due to COVID-19 virus   Sepsis due to Haemophilus influenzae with acute hypoxic respiratory failure and septic shock (HCC)   Pneumothorax   1. Acute on chronic respiratory failure with hypoxia as far as the ventilation is concerned I asked for respiratory therapy to try to turn the PEEP down secondary to the pneumothorax.   The size of the pneumothorax is large the family is actually considering not placing a chest tube since they are trying to make him comfort measures we will discuss with the primary care team 2. Chronic systolic heart failure being managed by primary care team diuretics as tolerated. 3. COVID-19 virus infection the actual infection has resolved however patient still has significant complications related to the infection 4. Pneumonia due to COVID-19 as above 5. Sepsis treated on antibiotics continue with supportive care 6. Pneumothorax now recurring patient is possibly going to be made comfort measures and so therefore chest tube will possibly not be placed   I have personally seen and evaluated the patient, evaluated laboratory and imaging results, formulated the assessment and plan and placed orders.  Time 35 minutes patient critically ill in danger of cardiac arrest and death The Patient requires high complexity decision making for assessment and support.  Case was discussed on Rounds with the Respiratory Therapy Staff  Allyne Gee, MD [facility] Pulmonary Critical Care Medicine Sleep Medicine

## 2019-11-24 DEATH — deceased

## 2019-12-06 LAB — FUNGUS CULTURE WITH STAIN

## 2019-12-06 LAB — FUNGAL ORGANISM REFLEX

## 2019-12-06 LAB — FUNGUS CULTURE RESULT

## 2019-12-19 LAB — ACID FAST CULTURE WITH REFLEXED SENSITIVITIES (MYCOBACTERIA): Acid Fast Culture: NEGATIVE

## 2020-02-06 ENCOUNTER — Ambulatory Visit: Payer: Self-pay | Admitting: Rheumatology

## 2020-11-10 IMAGING — DX DG CHEST 1V PORT
2 series · 2 of 2 positions shown · non-contrast
Comparison: CT 09/03/2019.  Chest x-ray 09/02/2019.

CLINICAL DATA: Tracheostomy.

EXAM:
PORTABLE CHEST 1 VIEW

[chest ap (1 of 2)]
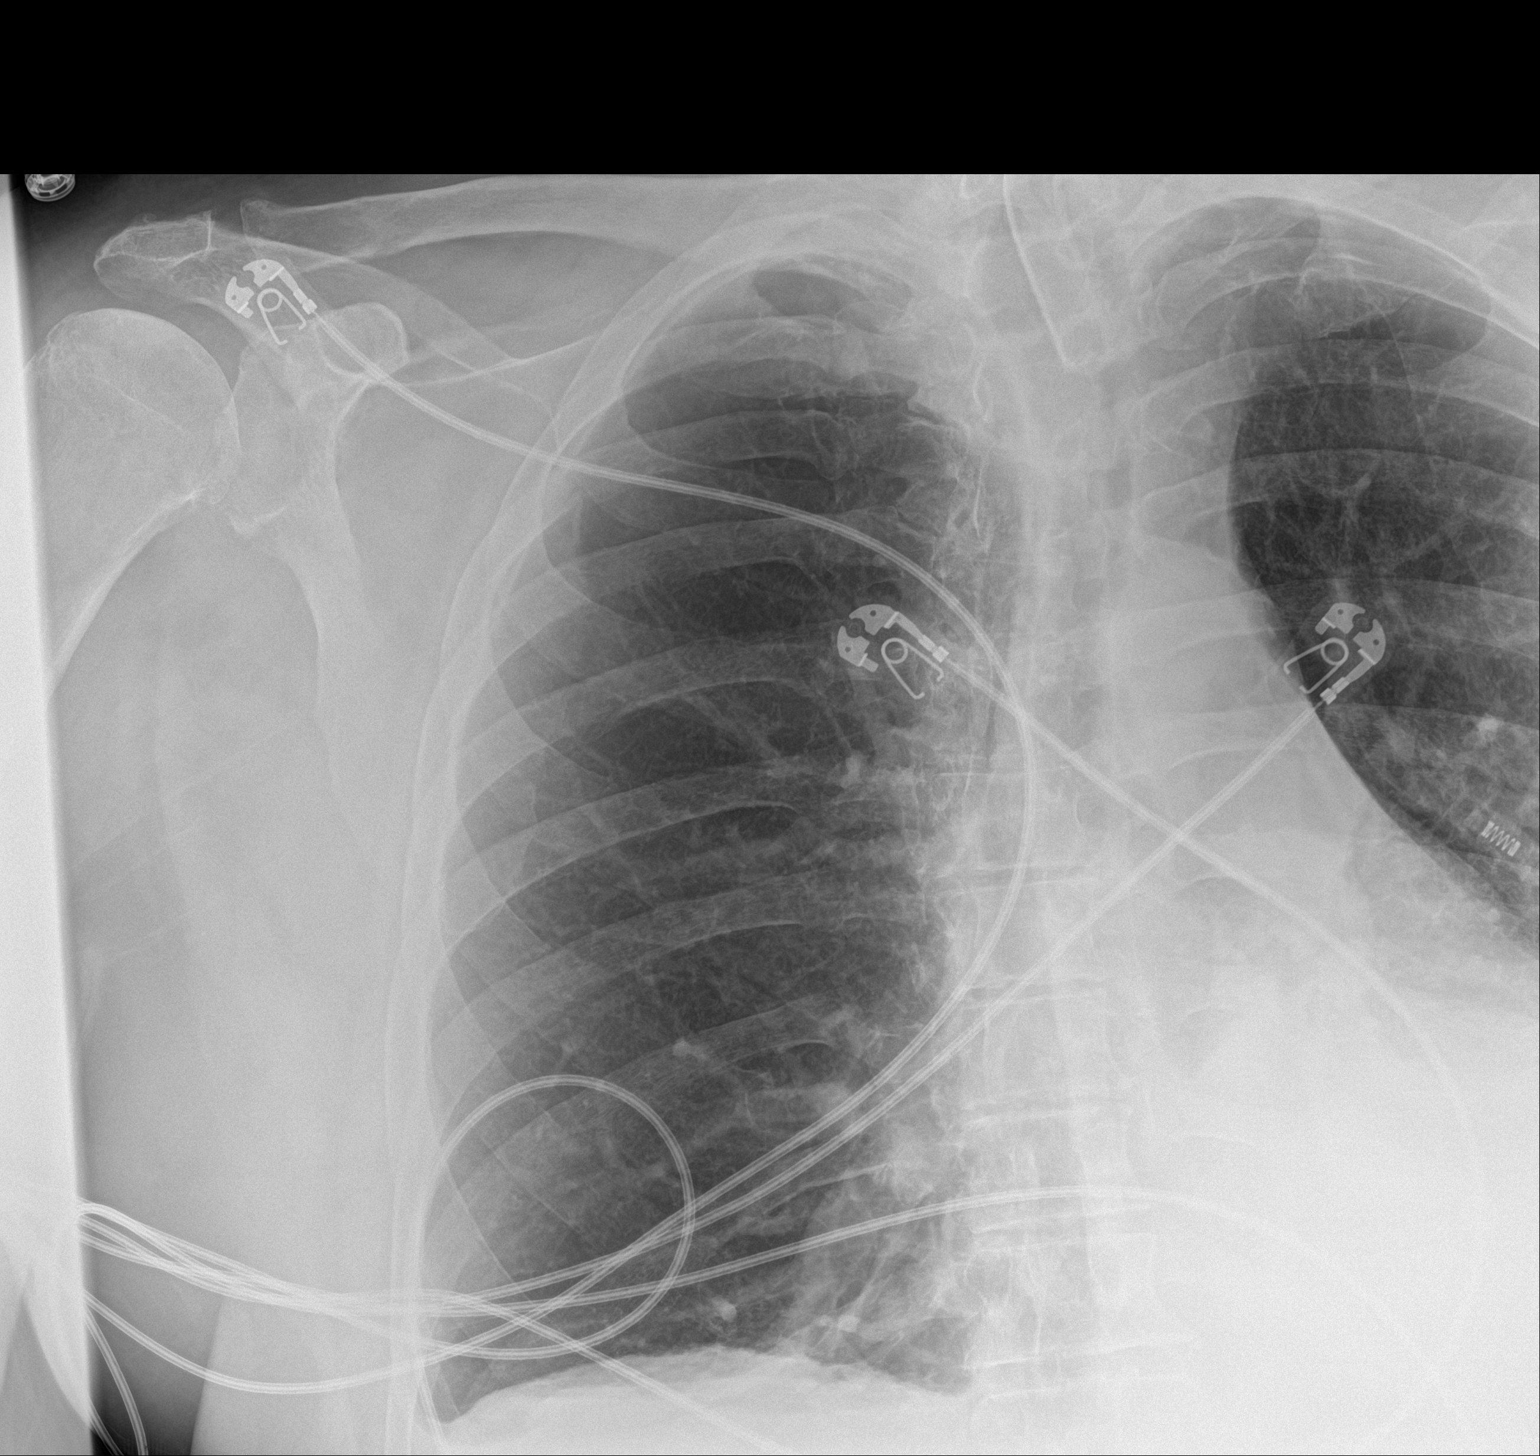

[chest ap (2 of 2)]
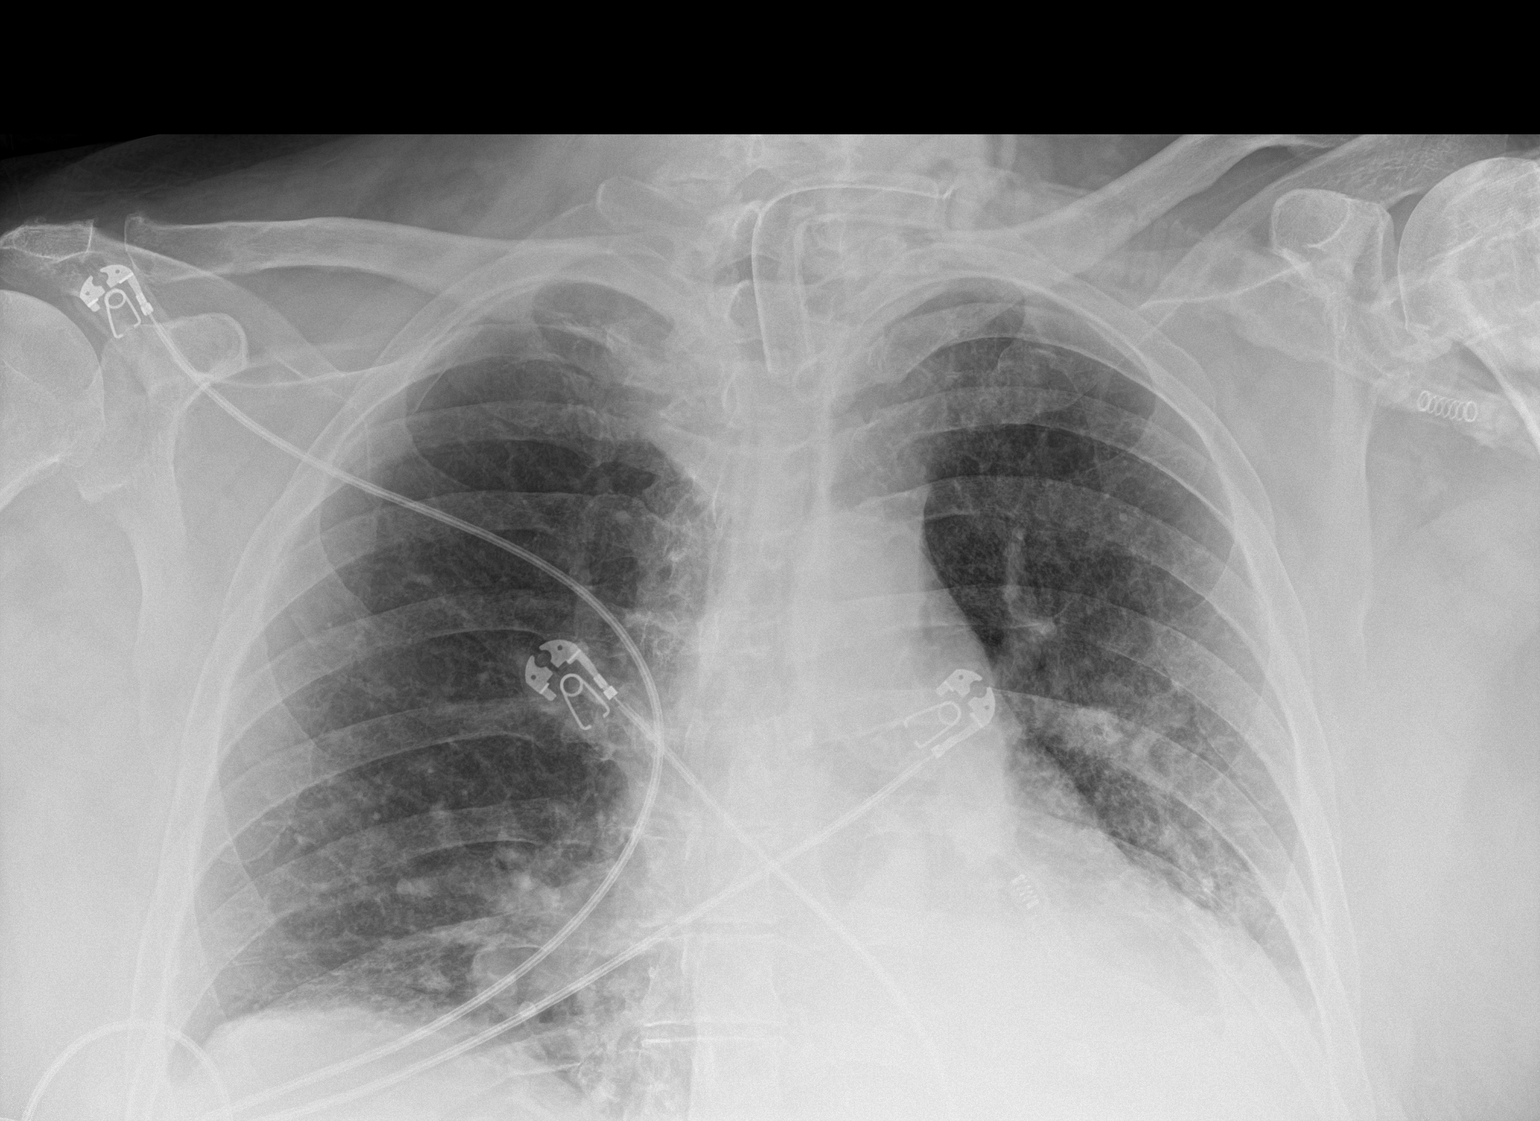

[2 of 2 positions shown; findings below may reference images not displayed]

FINDINGS: Tracheostomy tube in stable position. Heart size stable. Mild left
upper lobe infiltrate, best identified by recent CT. No right upper
lung infiltrate identified as noted on recent CT. Bibasilar
atelectasis/infiltrates again noted. Small left pleural effusion. No
pneumothorax.
IMPRESSION: 1.  Tracheostomy tube in stable position.

2. Mild left upper lobe infiltrate, best identified by recent CT. No
right upper lung infiltrate identified as noted on recent CT.
Bibasilar atelectasis/infiltrates again noted. Small left pleural
effusion.

## 2020-11-17 IMAGING — DX DG CHEST 1V PORT
1 series · 1 of 1 positions shown · non-contrast
Comparison: 09/09/2019.

CLINICAL DATA: Respiratory failure.

EXAM:
PORTABLE CHEST 1 VIEW

[chest ap]
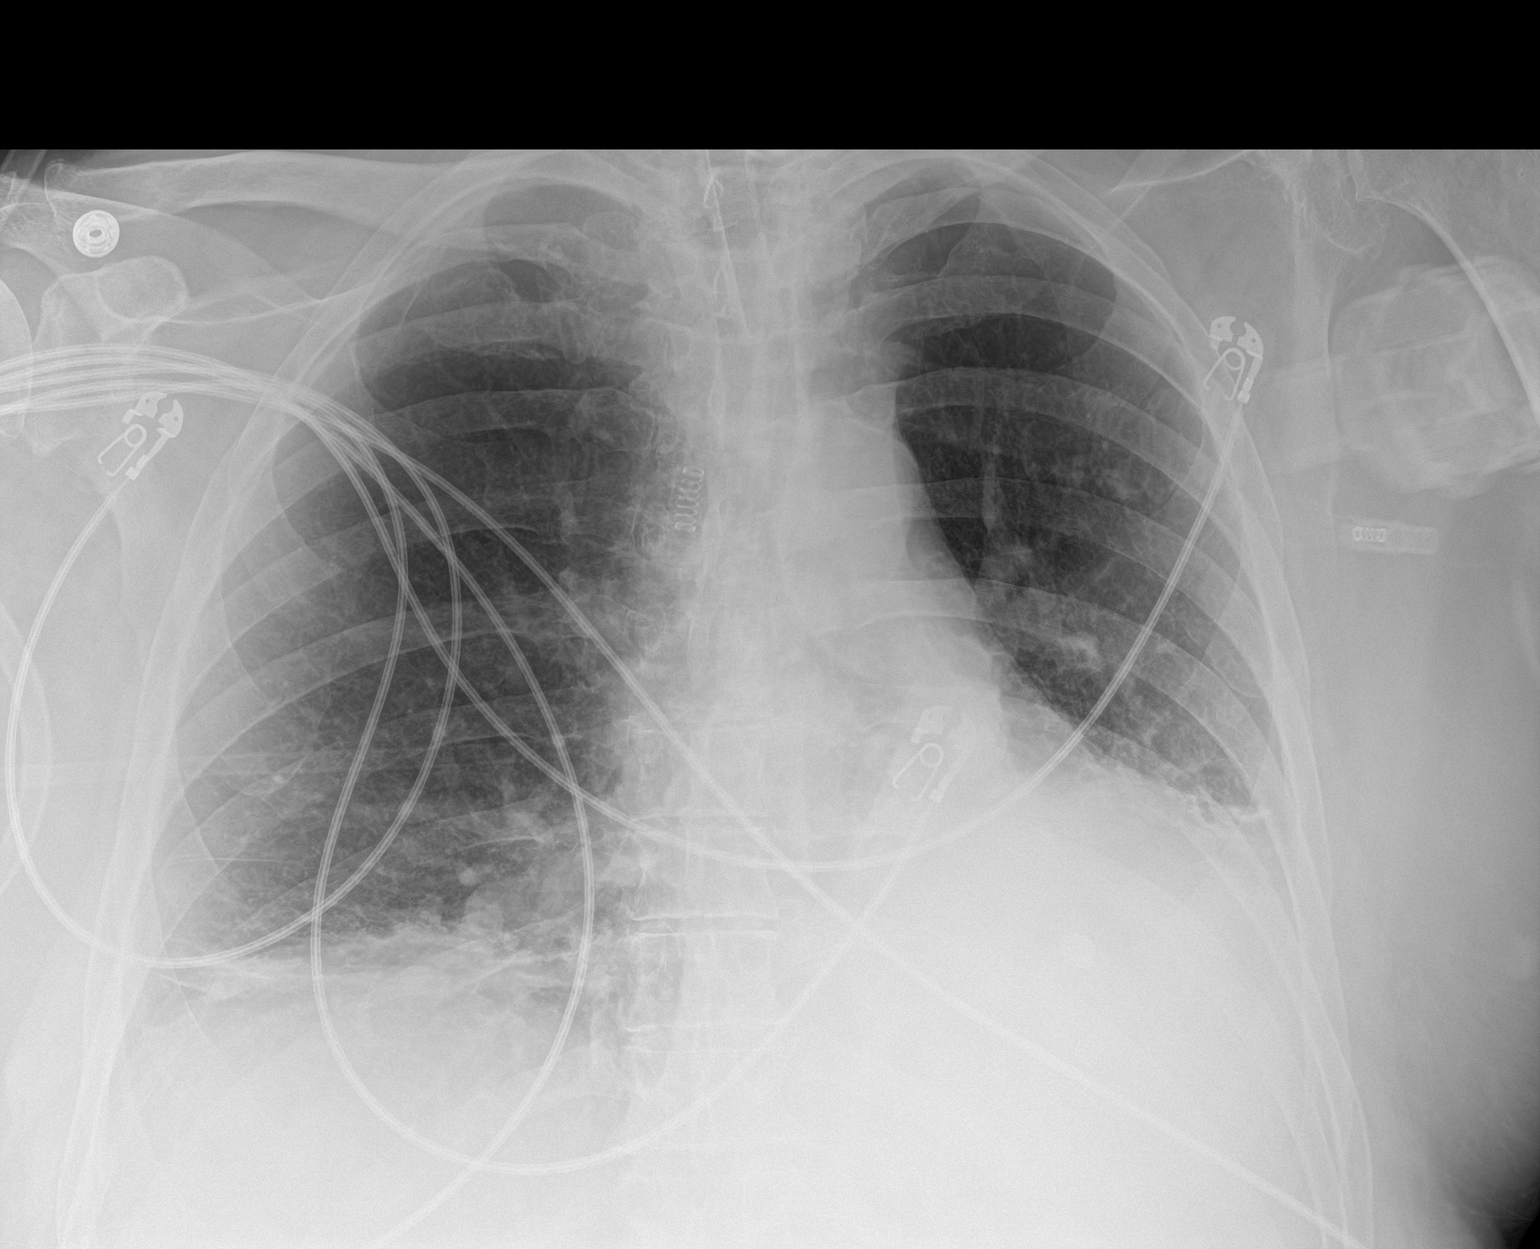

[1 of 1 positions shown; findings below may reference images not displayed]

FINDINGS: Tracheostomy tube in stable position. Heart size stable. Persistent
left base atelectasis/infiltrate. Progressive right base
atelectasis. Small left pleural effusion. No pneumothorax.
Degenerative change thoracic spine and both shoulders.
IMPRESSION: 1.  Tracheostomy tube in stable position.

2. Persistent left base atelectasis/infiltrate and small left
pleural effusion. Progressive right base atelectatic changes.

## 2020-12-22 IMAGING — CT CT ABD-PELV W/ CM
2 of 5 series · 14 of 46 positions shown, 16 images · IV contrast (omnipaque)
Comparison: Chest radiograph dated 10/14/2019.

CLINICAL DATA: 68-year-old male with concern for pulmonary
embolism. [id number].

EXAM:
CT ANGIOGRAPHY CHEST
CT ABDOMEN AND PELVIS WITH CONTRAST
TECHNIQUE: Multidetector CT imaging of the chest was performed using the
standard protocol during bolus administration of intravenous
contrast. Multiplanar CT image reconstructions and MIPs were
obtained to evaluate the vascular anatomy. Multidetector CT imaging
of the abdomen and pelvis was performed using the standard protocol
during bolus administration of intravenous contrast.
CONTRAST:  100mL OMNIPAQUE IOHEXOL 350 MG/ML SOLN

[Series 3: a/p w/ 5mm · axial · 0.98mm/px · z∈[+1081,+1596]mm · 11 of 117 slices shown, 13 images]
[im 7/117  soft-tissue]
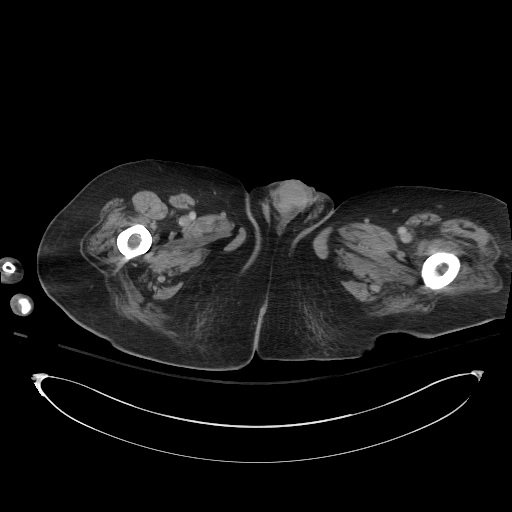
[im 7/117  bone]
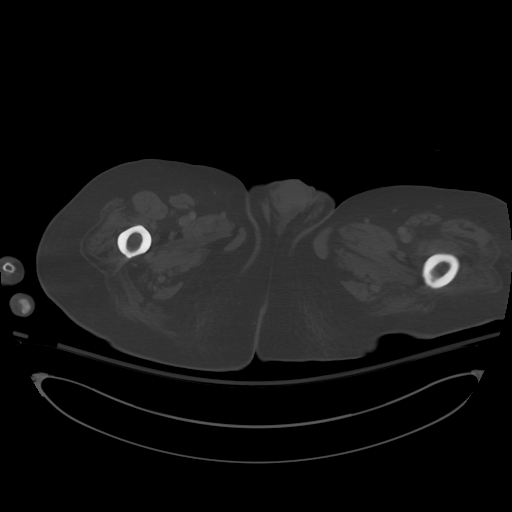
[im 19/117  soft-tissue]
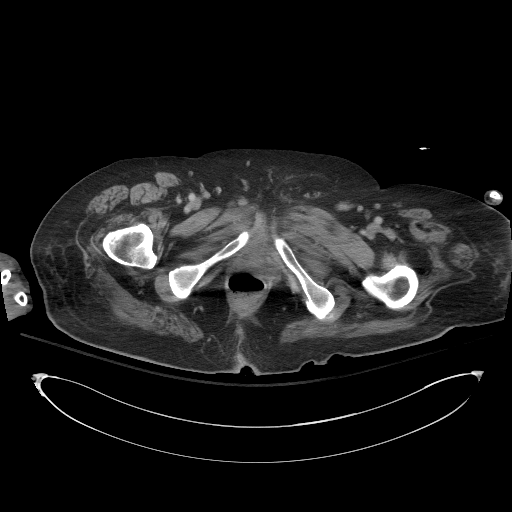
[im 31/117  soft-tissue]
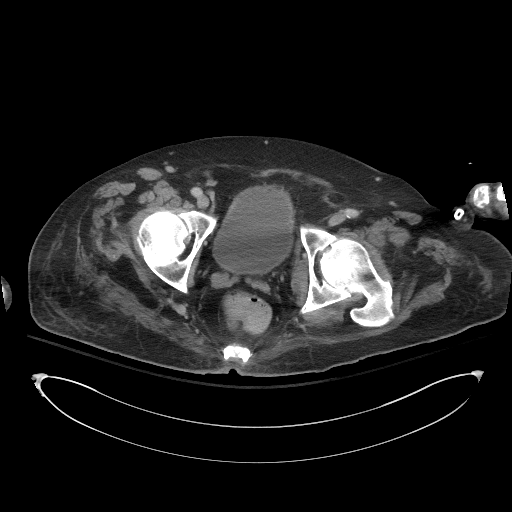
[im 37/117  soft-tissue]
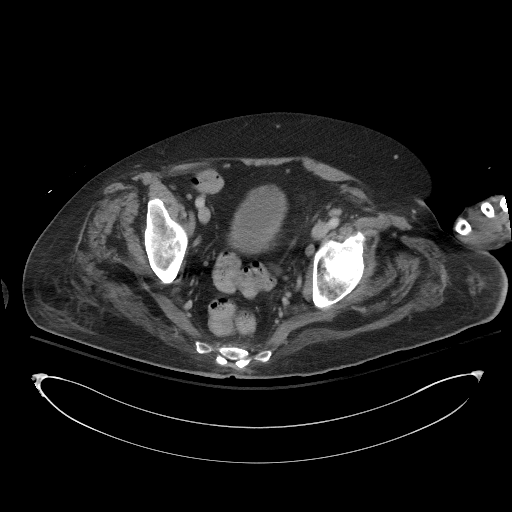
[im 49/117  soft-tissue]
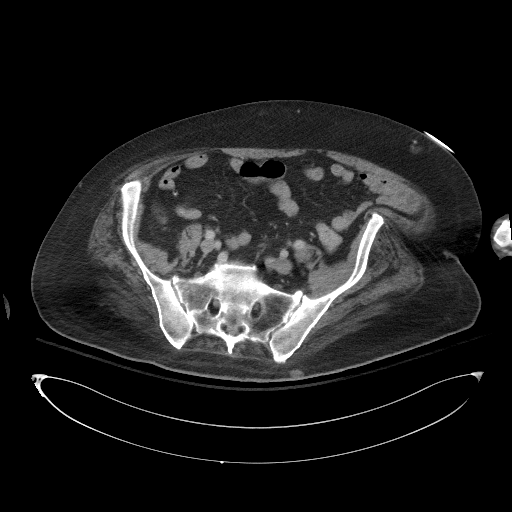
[im 62/117  soft-tissue]
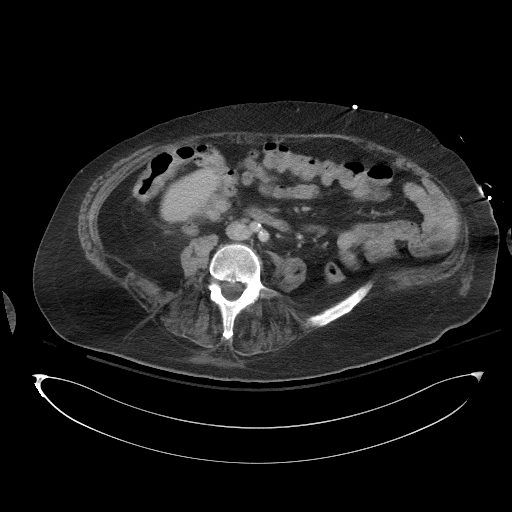
[im 68/117  soft-tissue]
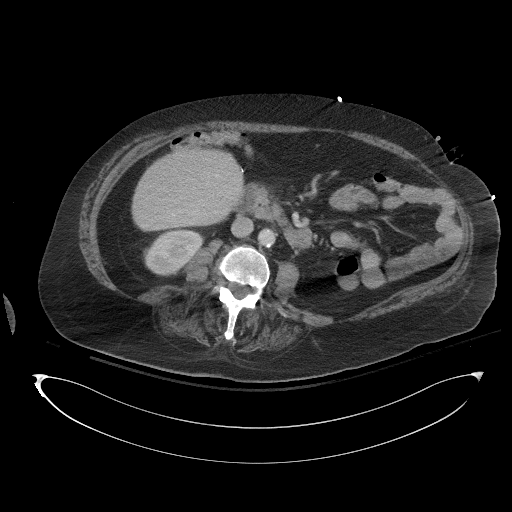
[im 80/117  soft-tissue]
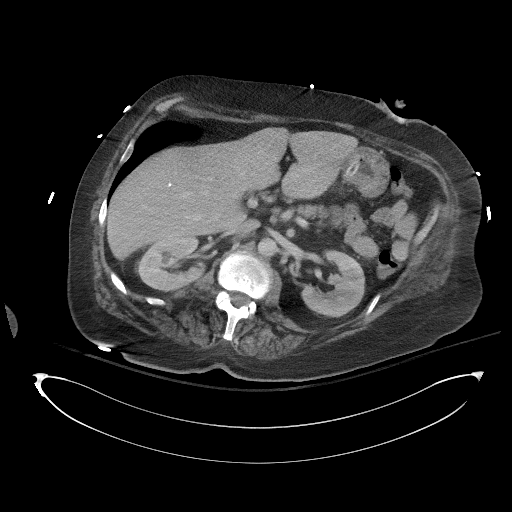
[im 86/117  soft-tissue]
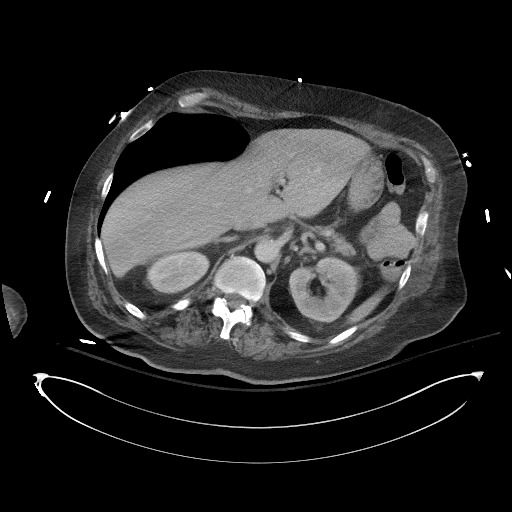
[im 86/117  bone]
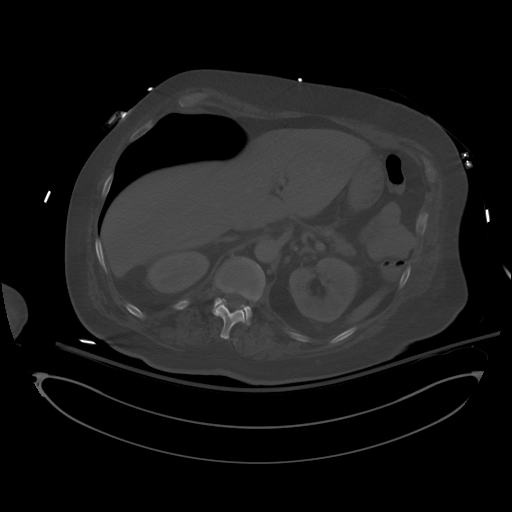
[im 98/117  soft-tissue]
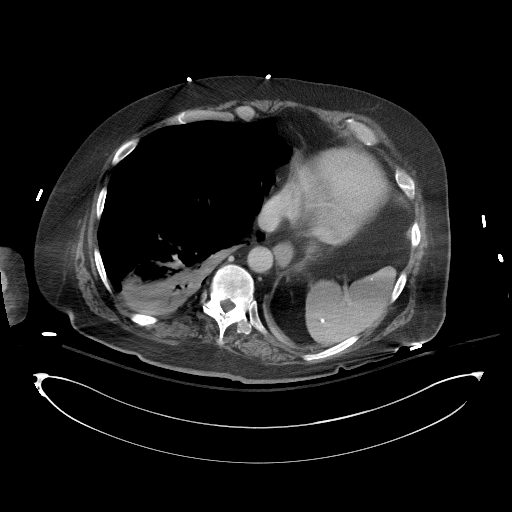
[im 110/117  soft-tissue]
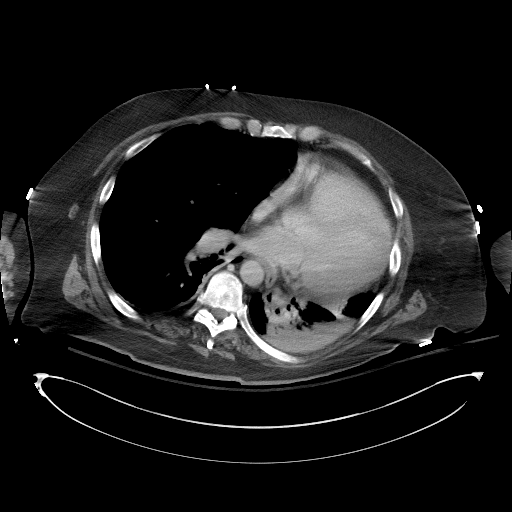

[Series 6: a/p w/ cor · coronal · 1.04mm/px · 3 of 168 slices shown]
[im 56/168  soft-tissue]
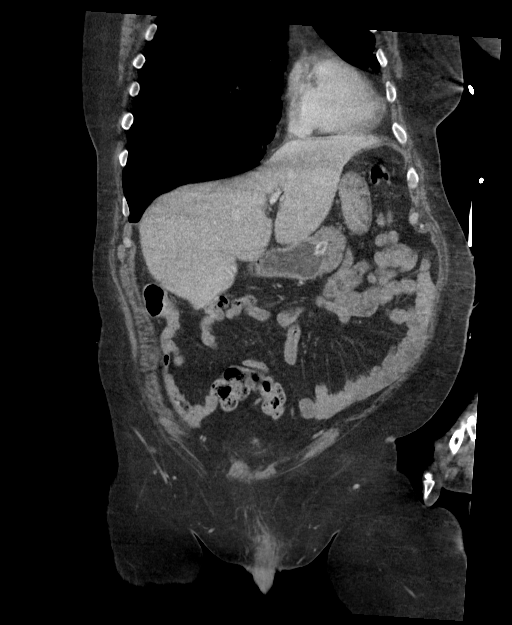
[im 75/168  soft-tissue]
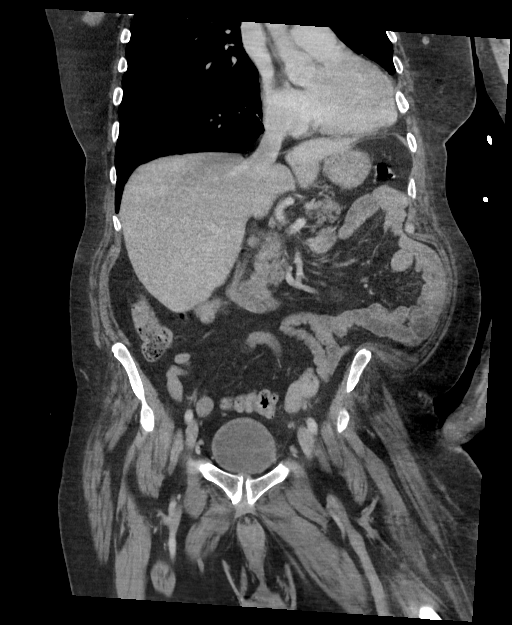
[im 93/168  soft-tissue]
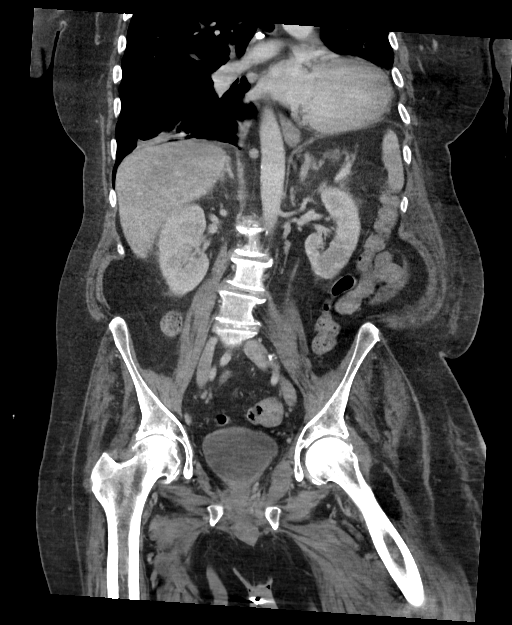

[14 of 46 positions shown; findings below may reference images not displayed]

FINDINGS: Evaluation is limited due to streak artifact caused by patient's
arms.

CTA CHEST FINDINGS

Cardiovascular: There is no cardiomegaly. Trace pericardial effusion
measuring 5 mm anterior to the heart. Minimal atherosclerotic
calcification of the thoracic aorta. No aneurysmal dilatation. There
is mild dilatation of the main pulmonary trunk suggestive of a
degree of pulmonary hypertension. Evaluation of the pulmonary
arteries is limited due to streak artifact caused by patient's arms
and suboptimal opacification of the peripheral branches. No
pulmonary artery embolus identified.

Mediastinum/Nodes: No hilar or mediastinal adenopathy. Calcified
granuloma noted. The esophagus is grossly unremarkable. No
mediastinal fluid collection.

Lungs/Pleura: There is a large right pneumothorax (20% or greater)
with shift of the mediastinum into the left hemithorax concerning
for a degree of tension. There is background of emphysema.
Consolidative changes of the majority of the left lower lobe with
air bronchogram may represent atelectasis or infiltrate. Aspiration
is not excluded. There are consolidative changes of the right lung
base also atelectasis versus infiltrate. Areas of interstitial
coarsening involving the anterior subpleural region of the left
upper lobe and lingula as well as right middle lobe. A tracheostomy
with tip above the carina. There is mucous secretion within the left
lower lobe bronchi. There is extension of the pneumothorax into the
diaphragmatic hiatus. No pleural effusion.

Musculoskeletal: No acute osseous pathology.

Review of the MIP images confirms the above findings.

CT ABDOMEN and PELVIS FINDINGS

No intra-abdominal free air or free fluid.

Hepatobiliary: Probable background of fatty infiltration of the
liver. Subcentimeter right hepatic hypodense focus is not
characterized. No intrahepatic biliary ductal dilatation.
Cholecystectomy.

Pancreas: Unremarkable. No pancreatic ductal dilatation or
surrounding inflammatory changes.

Spleen: Small scattered calcified splenic granuloma.

Adrenals/Urinary Tract: The right adrenal gland is unremarkable.
Minimal nodularity of the left adrenal gland. Subcentimeter
bilateral renal hypodense lesions are too small to characterize.
There is no hydronephrosis on either side. There is symmetric
enhancement and excretion of contrast by both kidneys. The
visualized ureters appear unremarkable. Mild trabeculated appearance
of the bladder wall may be related to chronic bladder outlet
obstruction.

Stomach/Bowel: There is a percutaneous gastrostomy with balloon in
the distal stomach. There is no bowel obstruction or active
inflammation. The appendix is normal.

Vascular/Lymphatic: Mild aortoiliac atherosclerotic disease. The IVC
is unremarkable. No portal venous gas. There is no adenopathy.

Reproductive: The prostate and seminal vesicles are grossly
unremarkable.

Other: None

Musculoskeletal: Multilevel degenerative changes of the spine. No
acute osseous pathology.

Review of the MIP images confirms the above findings.
IMPRESSION: 1. Large right pneumothorax with shift of the mediastinum into the
left hemithorax concerning for a degree of tension.
2. No CT evidence of pulmonary artery embolus.
3. Emphysema.
4. Bilateral lower lobe consolidative changes may represent
atelectasis or infiltrate. Aspiration is not excluded. Clinical
correlation is recommended.
5. No acute intra-abdominal or pelvic pathology. No bowel
obstruction or active inflammation. Normal appendix.
6. Aortic Atherosclerosis (1FS6A-[id number].V) and Emphysema ([id number].D).

These results were called by telephone at the time of interpretation
on 10/17/2019 at [DATE] to provider Marquez Mendoza, who verbally
acknowledged these results.
